# Patient Record
Sex: Female | Born: 1985 | Race: Black or African American | Hispanic: No | Marital: Married | Smoking: Never smoker
Health system: Southern US, Community
[De-identification: ages and names within clinical notes are randomized; demographics above are authoritative.]

## PROBLEM LIST (undated history)

## (undated) ENCOUNTER — Emergency Department (HOSPITAL_COMMUNITY): Admission: EM | Disposition: A | Discharge status: Other Acute Inpatient Hospital | Payer: Self-pay

## (undated) DIAGNOSIS — M545 Low back pain: Secondary | ICD-10-CM

## (undated) DIAGNOSIS — G35 Multiple sclerosis: Secondary | ICD-10-CM

## (undated) DIAGNOSIS — D649 Anemia, unspecified: Secondary | ICD-10-CM

## (undated) DIAGNOSIS — A599 Trichomoniasis, unspecified: Secondary | ICD-10-CM

## (undated) HISTORY — PX: OTHER SURGICAL HISTORY: SHX169

## (undated) HISTORY — DX: Low back pain: M54.5

## (undated) HISTORY — DX: Multiple sclerosis: G35

---

## 2010-11-15 ENCOUNTER — Emergency Department (HOSPITAL_COMMUNITY)
Admission: EM | Admit: 2010-11-15 | Discharge: 2010-11-15 | Payer: Self-pay | Source: Home / Self Care | Admitting: Emergency Medicine

## 2011-01-29 ENCOUNTER — Emergency Department (HOSPITAL_COMMUNITY)
Admission: EM | Admit: 2011-01-29 | Discharge: 2011-01-30 | Disposition: A | Discharge status: Home / Self Care | Payer: Self-pay | Attending: Emergency Medicine | Admitting: Emergency Medicine

## 2011-01-29 DIAGNOSIS — R072 Precordial pain: Secondary | ICD-10-CM | POA: Insufficient documentation

## 2011-01-29 DIAGNOSIS — M940 Chondrocostal junction syndrome [Tietze]: Secondary | ICD-10-CM | POA: Insufficient documentation

## 2011-01-29 DIAGNOSIS — M549 Dorsalgia, unspecified: Secondary | ICD-10-CM | POA: Insufficient documentation

## 2011-01-29 LAB — URINALYSIS, ROUTINE W REFLEX MICROSCOPIC
Ketones, ur: 15 mg/dL — AB
Protein, ur: NEGATIVE mg/dL
Urobilinogen, UA: 1 mg/dL (ref 0.0–1.0)

## 2011-01-29 LAB — URINE MICROSCOPIC-ADD ON

## 2011-01-29 LAB — POCT PREGNANCY, URINE: Preg Test, Ur: NEGATIVE

## 2012-01-16 ENCOUNTER — Encounter (HOSPITAL_COMMUNITY): Payer: Self-pay

## 2012-01-16 ENCOUNTER — Emergency Department (HOSPITAL_COMMUNITY)
Admission: EM | Admit: 2012-01-16 | Discharge: 2012-01-16 | Disposition: A | Discharge status: Home / Self Care | Payer: Self-pay | Attending: Emergency Medicine | Admitting: Emergency Medicine

## 2012-01-16 DIAGNOSIS — S0500XA Injury of conjunctiva and corneal abrasion without foreign body, unspecified eye, initial encounter: Secondary | ICD-10-CM | POA: Insufficient documentation

## 2012-01-16 DIAGNOSIS — X58XXXA Exposure to other specified factors, initial encounter: Secondary | ICD-10-CM | POA: Insufficient documentation

## 2012-01-16 MED ORDER — ERYTHROMYCIN 5 MG/GM OP OINT: TOPICAL_OINTMENT | OPHTHALMIC | Status: AC

## 2012-01-16 MED ORDER — FLUORESCEIN SODIUM 1 MG OP STRP
1.0000 | ORAL_STRIP | Freq: Once | OPHTHALMIC | Status: AC
  Administered 2012-01-16: 1 via OPHTHALMIC
  Filled 2012-01-16: qty 1

## 2012-01-16 MED ORDER — PROPARACAINE HCL 0.5 % OP SOLN
1.0000 [drp] | Freq: Once | OPHTHALMIC | Status: AC
  Administered 2012-01-16: 1 [drp] via OPHTHALMIC
  Filled 2012-01-16: qty 15

## 2012-01-16 NOTE — ED Notes (Signed)
Pt complains that her right eye is blurry and if she looks down it feels like a pin is stabbing her eye

## 2012-01-16 NOTE — Discharge Instructions (Signed)
This is likely a corneal abrasion. Please use the ointment as prescribed. Make a follow up with the ophthalmologist listed above if your condition worsens. Use the medication as prescribed for the next 10 days. Return to the ER if your condition worsens.  Corneal Abrasion The cornea is the clear covering at the front and center of the eye. When looking at the colored portion (iris) of the eye, you are looking through that person's cornea.  This very thin tissue is made up of many layers. The surface layer is a single layer of cells called the corneal epithelium. This is one of the most sensitive tissues in the body. If a scratch or injury causes the corneal epithelium to come off, it is called a corneal abrasion. If the injury extends to the tissues below the epithelium, the condition is called a corneal ulcer.  CAUSES   Scratches.   Trauma.   Foreign body in the eye.   Some people have recurrences of abrasions in the area of the original injury even after they heal. This is called recurrent erosion syndrome. Recurrent erosion syndromes generally improve and go away with time.  SYMPTOMS   Eye pain.   Difficulty or inability to keep the injured eye open.   The eye becomes very sensitive to light.   Recurrent erosions tend to happen suddenly, first thing in the morning - usually upon awakening and opening the eyes.  DIAGNOSIS  Your eye professional can diagnose a corneal abrasion during an eye exam. Dye is usually placed in the eye using a drop or a small paper strip moistened by the patient's tears. When the eye is examined with a special light, the abrasion shows up clearly because of the dye. TREATMENT   Small abrasions may be treated with antibiotic drops or ointment alone.   Usually a pressure patch is specially applied. Pressure patches prevent the eye from blinking, allowing the corneal epithelium to heal. Because blinking is less, a pressure patch also reduces the amount of pain  present in the eye during healing. Most corneal abrasions heal within 2-3 days with no effect on vision. WARNING: Do not drive or operate machinery while your eye is patched. Your ability to judge distances is impaired.   If abrasion becomes infected and spreads to the deeper tissues of the cornea, a corneal ulcer can result. This is serious because it can cause corneal scarring. Corneal scars interfere with light passing through the cornea, and cause a loss of vision in the involved eye.   If your caregiver has given you a follow-up appointment, it is very important to keep that appointment. Not keeping the appointment could result in a severe eye infection or permanent loss of vision. If there is any problem keeping the appointment, you must call back to this facility for assistance.  SEEK MEDICAL CARE IF:   You have pain, light sensitivity and a scratchy feeling in one eye (or both).   Your pressure patch keeps loosening up and you can blink your eye under the patch after treatment.   Any kind of discharge develops from the involved eye after treatment or if the lids stick together in the morning.   You have the same symptoms in the morning as you did with the original abrasion days, weeks or months after the abrasion healed.  MAKE SURE YOU:   Understand these instructions.   Will watch your condition.   Will get help right away if you are not doing well or  get worse.  Document Released: 10/04/2000 Document Revised: 09/26/2011 Document Reviewed: 05/12/2008 [facility] Patient Information 2012 [place], [place].

## 2012-01-16 NOTE — ED Provider Notes (Signed)
History     CSN: [id number]  Arrival date & time 01/16/12  2015   First MD Initiated Contact with Patient 01/16/12 2232      Chief Complaint  Patient presents with  . Eye Pain    (Consider location/radiation/quality/duration/timing/severity/associated sxs/prior treatment) Patient is a 26 y.o. female presenting with eye problem. The history is provided by the patient.  Eye Problem  This is a new problem. The current episode started 2 days ago. The problem occurs constantly. The problem has not changed since onset.There is pain in the right eye. There was no injury mechanism. The pain is moderate. There is no history of trauma to the eye. There is no known exposure to pink eye. She does not wear contacts. Associated symptoms include blurred vision and photophobia. Pertinent negatives include no decreased vision, no discharge, no double vision, no foreign body sensation, no eye redness, no nausea and no vomiting. She has tried nothing for the symptoms.   Pt presents with c/o blurry vision to the R medial eye, pain with looking down, and increased light sensitivity. No known FB or chemical exposure. She does not work with metal or wood.   Pt is a glasses wearer but does not wear contacts. Sees optometrist at [facility] for eye care. No hx injury to the eye.  History reviewed. No pertinent past medical history.  History reviewed. No pertinent past surgical history.  History reviewed. No pertinent family history.  History  Substance Use Topics  . Smoking status: Not on file  . Smokeless tobacco: Not on file  . Alcohol Use: No    OB History    Grav Para Term Preterm Abortions TAB SAB Ect Mult Living                  Review of Systems  Eyes: Positive for blurred vision and photophobia. Negative for double vision, discharge and redness.  Gastrointestinal: Negative for nausea and vomiting.  as per HPI  Allergies  Review of patient's allergies indicates no known allergies.  Home  Medications   Current Outpatient Rx  Name Route Sig Dispense Refill  . BC HEADACHE POWDER PO Oral Take 1 packet by mouth daily as needed. For pain.    . IBUPROFEN 200 MG PO TABS Oral Take 400 mg by mouth every 8 (eight) hours as needed. For pain.    Marland Kitchen PRENATAL MULTIVITAMIN CH Oral Take 1 tablet by mouth daily.      BP 121/58  Pulse 72  Temp(Src) 98 F (36.7 C) (Oral)  Resp 18  SpO2 99%  LMP 12/06/2011  Physical Exam  Nursing note and vitals reviewed. Constitutional: She appears well-developed and well-nourished. No distress.  HENT:  Head: Normocephalic and atraumatic.  Eyes: Conjunctivae and EOM are normal. Pupils are equal, round, and reactive to light.  Fundoscopic exam:      The right eye shows no AV nicking and no hemorrhage. The right eye shows red reflex.      The left eye shows no AV nicking and no hemorrhage. The left eye shows red reflex. Slit lamp exam:      The right eye shows fluorescein uptake. The right eye shows no corneal ulcer, no foreign body, no hyphema, no hypopyon and no anterior chamber bulge.         Conjunctival abrasion to medial sclera with inc fluorescein uptake, no FB 20/70 R, 20/40 L corrected  Neck: Normal range of motion.  Cardiovascular: Normal rate.   Pulmonary/Chest: Effort normal.  Neurological: She is alert.  Skin: Skin is warm and dry. She is not diaphoretic.  Psychiatric: She has a normal mood and affect.    ED Course  Procedures (including critical care time)  Labs Reviewed - No data to display No results found.   1. Conjunctival abrasion       MDM  Pt with conjunctival abrasion to medial sclera R. No evidence for corneal abrasion or other injury to eye. Will start on erythro ointment. Instructed to f/u with optho within 1-2 days for recheck. Return precautions discussed.        Grant Fontana, [place] 01/17/12 1434

## 2012-01-17 NOTE — ED Provider Notes (Signed)
Medical screening examination/treatment/procedure(s) were performed by non-physician practitioner and as supervising physician I was immediately available for consultation/collaboration.   Analisa Sledd, MD 01/17/12 2255 

## 2012-01-18 ENCOUNTER — Encounter (HOSPITAL_COMMUNITY): Payer: Self-pay | Admitting: Nurse Practitioner

## 2012-01-18 ENCOUNTER — Emergency Department (HOSPITAL_COMMUNITY)
Admission: EM | Admit: 2012-01-18 | Discharge: 2012-01-18 | Disposition: A | Discharge status: Home / Self Care | Payer: Self-pay | Attending: Emergency Medicine | Admitting: Emergency Medicine

## 2012-01-18 DIAGNOSIS — H538 Other visual disturbances: Secondary | ICD-10-CM | POA: Insufficient documentation

## 2012-01-18 MED ORDER — TETRACAINE HCL 0.5 % OP SOLN
1.0000 [drp] | Freq: Once | OPHTHALMIC | Status: DC
  Filled 2012-01-18: qty 2

## 2012-01-18 MED ORDER — TOBRAMYCIN 0.3 % OP SOLN
2.0000 [drp] | Freq: Once | OPHTHALMIC | Status: AC
  Administered 2012-01-18: 2 [drp] via OPHTHALMIC

## 2012-01-18 MED ORDER — TOBRAMYCIN 0.3 % OP SOLN: 2.0000 [drp] | Freq: Once | OPHTHALMIC | Status: DC

## 2012-01-18 MED ORDER — TOBRAMYCIN 0.3 % OP SOLN
2.0000 [drp] | Freq: Once | OPHTHALMIC | Status: DC
  Filled 2012-01-18: qty 5

## 2012-01-18 MED ORDER — TETRACAINE HCL 0.5 % OP SOLN
1.0000 [drp] | Freq: Once | OPHTHALMIC | Status: AC
  Administered 2012-01-18: 1 [drp] via OPHTHALMIC

## 2012-01-18 MED ORDER — FLUORESCEIN SODIUM 1 MG OP STRP
1.0000 | ORAL_STRIP | Freq: Once | OPHTHALMIC | Status: AC
  Administered 2012-01-18: 1 via OPHTHALMIC

## 2012-01-18 NOTE — ED Notes (Signed)
Pt was seen at [facility] on Thursday night and started on erythromycin for corneal abrasion. States she is having increased blurred vision and pressure in that eye even though she has been using the erythromycin, however she states she was not told how often or how long to apply the erythromycin so she has been using it 2x a day

## 2012-01-18 NOTE — ED Provider Notes (Signed)
History     CSN: [id number]  Arrival date & time 01/18/12  1240   First MD Initiated Contact with Patient 01/18/12 1303      Chief Complaint  Patient presents with  . Blurred Vision    (Consider location/radiation/quality/duration/timing/severity/associated sxs/prior treatment) HPI Comments: Patient presents with persistent blurry vision with occasional pain in right eye. She was seen two days ago and given tobrex ointment and diagnosed with a scleral abrasion. She states she was unable to schedule an appointment with ophthalmology. No drainage. No N/V. Pain is mild. She denies FB. She states she was using ointment, however only twice a day. She does not wear contact lenses.   Patient is a 26 y.o. female presenting with eye pain. The history is provided by the patient.  Eye Pain This is a recurrent problem. The current episode started in the past 7 days. The problem has been unchanged. Pertinent negatives include no fever, headaches, nausea, sore throat or vomiting. The symptoms are aggravated by nothing. She has tried nothing for the symptoms.    History reviewed. No pertinent past medical history.  History reviewed. No pertinent past surgical history.  History reviewed. No pertinent family history.  History  Substance Use Topics  . Smoking status: Never Smoker   . Smokeless tobacco: Not on file  . Alcohol Use: No    OB History    Grav Para Term Preterm Abortions TAB SAB Ect Mult Living                  Review of Systems  Constitutional: Negative for fever.  HENT: Negative for sore throat and rhinorrhea.   Eyes: Positive for pain and visual disturbance. Negative for photophobia, discharge and redness.  Gastrointestinal: Negative for nausea and vomiting.  Neurological: Negative for headaches.    Allergies  Review of patient's allergies indicates no known allergies.  Home Medications   Current Outpatient Rx  Name Route Sig Dispense Refill  . BC HEADACHE POWDER  PO Oral Take 1 packet by mouth daily as needed. For pain.    Marland Kitchen ERYTHROMYCIN 5 MG/GM OP OINT  Place a 1/2 inch ribbon of ointment into the lower eyelid. 1 g 0  . IBUPROFEN 200 MG PO TABS Oral Take 400 mg by mouth every 8 (eight) hours as needed. For pain.    Marland Kitchen PRENATAL MULTIVITAMIN CH Oral Take 1 tablet by mouth daily.      BP 126/77  Pulse 70  Temp 99.3 F (37.4 C)  Resp 20  Ht 5\' 10"  (1.778 m)  Wt 200 lb (90.719 kg)  BMI 28.70 kg/m2  SpO2 99%  LMP 12/06/2011  Physical Exam  Nursing note and vitals reviewed. Constitutional: She is oriented to person, place, and time. She appears well-developed and well-nourished.  HENT:  Head: Normocephalic and atraumatic.  Right Ear: External ear normal.  Left Ear: External ear normal.  Nose: Nose normal.  Mouth/Throat: Oropharynx is clear and moist.  Eyes: Conjunctivae and EOM are normal. Pupils are equal, round, and reactive to light. Right eye exhibits no chemosis, no discharge and no exudate. No foreign body present in the right eye. Left eye exhibits no chemosis, no discharge and no exudate. Right conjunctiva is not injected. Right conjunctiva has no hemorrhage. Left conjunctiva is not injected. Left conjunctiva has no hemorrhage. No scleral icterus. Right eye exhibits normal extraocular motion and no nystagmus. Left eye exhibits normal extraocular motion and no nystagmus.  Fundoscopic exam:      The right  eye shows no arteriolar narrowing, no AV nicking, no exudate, no hemorrhage and no papilledema. The right eye shows no venous pulsations. Slit lamp exam:      The right eye shows no corneal abrasion, no corneal ulcer, no foreign body, no hyphema, no fluorescein uptake and no anterior chamber bulge.  Neck: Normal range of motion. Neck supple.  Pulmonary/Chest: No respiratory distress.  Neurological: She is alert and oriented to person, place, and time.  Skin: Skin is warm and dry.  Psychiatric: She has a normal mood and affect.    ED  Course  Procedures (including critical care time)  Labs Reviewed - No data to display No results found.   1. Blurry vision     1:16 PM Patient seen and examined. Work-up initiated. Medications ordered.   Vital signs reviewed and are as follows: Filed Vitals:   01/18/12 1248  BP: 126/77  Pulse: 70  Temp: 99.3 F (37.4 C)  Resp: 20   Funduscopic exam performed. No abnormalities identified on non-dilated exam.   Two drops of tetracaine instilled into affected eye.   Fluorescein strip applied to affected eye. Slit lamp used to assess for corneal abrasion. No corneal abrasion identified. No foreign bodies noted. No visible hyphema.   Tonometry performed. Right eye pressure: 16,18  Patient tolerated procedure well without immediate complication.   Pt requested drops instead of ointment.   Patient counseled to use tobrex as follows: 1-2 drops in affected eye(s) every 4 hours while awake.Marland Kitchen  Pt counseled to see ophtho referral given prior, return with worsening.   MDM  Blurry vision -- not improving. Normal occular pressures in a young patient. No corneal abrasion identified. EOMs intact. No signs of iritis. Funduscopy normal on limited exam. No foreign bodies noted. No surrounding erythema, swelling, vision changes/loss suspicious for orbital or periorbital cellulitis. No symptoms of retinal detachment. No ophthalmologic emergency suspected. Outpatient referral already provided in case of no improvement.          Renne Crigler, [place] 01/18/12 1704

## 2012-01-18 NOTE — ED Notes (Signed)
Visual Acuity:  With Glasses- L-20/20 R-200/20 Both- 20/20  Without Glasses L-40/20 R-400/20 Both- 30/20

## 2012-01-18 NOTE — Discharge Instructions (Signed)
Please read and follow all provided instructions.  Your diagnoses today include:  1. Blurry vision    Tests performed today include:  Visual acuity testing to check your vision  Fluorescein dye examination to look for scratches on your eye  Eye pressure test (tonometry) that was normal  Vital signs. See below for your results today.   Medications prescribed:   Tobrex (tobramycin) - antibiotic eye drops  Use this medication as follows:   Use 1-2 drops in left eye every 4 hours while awake for 5 days.  Take any prescribed medications only as directed.  Home care instructions:  Follow any educational materials contained in this packet.  If you wear contact lenses, do not use them until your eye caregiver approves. See your caregiver or eye specialist as suggested for followup.   Follow-up instructions: Please follow-up with the opthalmologist listed in the next 2-3 days for further evaluation of your symptoms.  If you do not have a primary care doctor -- see below for referral information.   Return instructions:   Please return to the Emergency Department if you experience worsening symptoms.   Please return immediately if you develop severe pain, pus drainage, new change in vision, or fever.  Please return if you have any other emergent concerns.  Additional Information:  Your vital signs today were: BP 126/77  Pulse 70  Temp 99.3 F (37.4 C)  Resp 20  Ht 5\' 10"  (1.778 m)  Wt 200 lb (90.719 kg)  BMI 28.70 kg/m2  SpO2 99%  LMP 12/06/2011 If your blood pressure (BP) was elevated above 135/85 this visit, please have this repeated by your doctor within one month. -------------- No Primary Care Doctor Call [facility]  [phone] Other agencies that provide inexpensive medical care    [facility]  [phone]    [facility]  [phone]    [facility]  [phone]    [facility]  [phone]    [facility]  [phone]   [facility]  [phone] -------------- RESOURCE GUIDE:  Dental Problems  Patients with Medicaid: [facility] [phone] [address]                                            [phone] [address] Phone:  [phone]                                                   Phone:  [phone]  If unable to pay or uninsured, contact:  [facility] or [facility]. to become qualified for the adult dental clinic.  Chronic Pain Problems Contact [facility]  [phone] Patients need to be referred by their primary care doctor.  Insufficient Money for Medicine Contact [employer]:  call "211" or [facility] [phone].  Psychological Services [facility]  [phone] [facility]  [phone] [facility]   [phone] (emergency services [phone])  Substance Abuse Resources [facility]  [phone] [facility] [phone] [facility] [place] [phone] [facility] [phone] Residential &  Outpatient Substance Abuse Program  [phone]  Abuse/Neglect [facility] Child Abuse Hotline 620-607-8528 [facility] Child Abuse Hotline [phone] (After Hours)  Emergency Shelter [facility] [phone]  Maternity Homes [facility] at [facility] [place] [facility] [phone] [facility] [phone]  [facility]  [facility] [place]     [employer]                          [facility] Dept. [address] [place]                       [address]      [address]  Blondell Reveal Phone:  [phone]                                   Phone:  [phone]                 Phone:  [phone]  [facility] Phone:  [phone]  [facility] Child  Abuse Hotline 984-751-4734 [phone] (After Hours)

## 2012-01-19 NOTE — ED Provider Notes (Signed)
Medical screening examination/treatment/procedure(s) were performed by non-physician practitioner and as supervising physician I was immediately available for consultation/collaboration.   Forbes Cellar, MD 01/19/12 [phone]

## 2012-01-22 ENCOUNTER — Other Ambulatory Visit (HOSPITAL_COMMUNITY): Payer: Self-pay | Admitting: Ophthalmology

## 2012-01-22 DIAGNOSIS — H547 Unspecified visual loss: Secondary | ICD-10-CM

## 2012-01-23 ENCOUNTER — Ambulatory Visit (HOSPITAL_COMMUNITY): Admission: RE | Admit: 2012-01-23 | Payer: Self-pay | Source: Ambulatory Visit

## 2012-01-23 ENCOUNTER — Inpatient Hospital Stay (HOSPITAL_COMMUNITY)
Admission: RE | Admit: 2012-01-23 | Discharge: 2012-01-23 | Payer: Self-pay | Source: Ambulatory Visit | Attending: Ophthalmology | Admitting: Ophthalmology

## 2012-01-23 ENCOUNTER — Ambulatory Visit (HOSPITAL_COMMUNITY)
Admission: RE | Admit: 2012-01-23 | Discharge: 2012-01-23 | Disposition: A | Discharge status: Home / Self Care | Payer: Self-pay | Source: Ambulatory Visit | Attending: Ophthalmology | Admitting: Ophthalmology

## 2012-01-23 ENCOUNTER — Other Ambulatory Visit (HOSPITAL_COMMUNITY): Payer: Self-pay | Admitting: Ophthalmology

## 2012-01-23 DIAGNOSIS — H547 Unspecified visual loss: Secondary | ICD-10-CM

## 2012-01-23 DIAGNOSIS — G9389 Other specified disorders of brain: Secondary | ICD-10-CM | POA: Insufficient documentation

## 2012-01-23 DIAGNOSIS — H538 Other visual disturbances: Secondary | ICD-10-CM | POA: Insufficient documentation

## 2012-01-23 MED ORDER — GADOBENATE DIMEGLUMINE 529 MG/ML IV SOLN
19.0000 mL | Freq: Once | INTRAVENOUS | Status: AC | PRN
  Administered 2012-01-23: 19 mL via INTRAVENOUS

## 2012-09-30 ENCOUNTER — Emergency Department (HOSPITAL_COMMUNITY)
Admission: EM | Admit: 2012-09-30 | Discharge: 2012-09-30 | Disposition: A | Discharge status: Home / Self Care | Payer: Self-pay | Attending: Emergency Medicine | Admitting: Emergency Medicine

## 2012-09-30 ENCOUNTER — Encounter (HOSPITAL_COMMUNITY): Payer: Self-pay | Admitting: Emergency Medicine

## 2012-09-30 DIAGNOSIS — M79601 Pain in right arm: Secondary | ICD-10-CM

## 2012-09-30 DIAGNOSIS — M79602 Pain in left arm: Secondary | ICD-10-CM

## 2012-09-30 DIAGNOSIS — M79609 Pain in unspecified limb: Secondary | ICD-10-CM | POA: Insufficient documentation

## 2012-09-30 DIAGNOSIS — R209 Unspecified disturbances of skin sensation: Secondary | ICD-10-CM | POA: Insufficient documentation

## 2012-09-30 NOTE — ED Provider Notes (Signed)
History     CSN: [id number]  Arrival date & time 09/30/12  1903   First MD Initiated Contact with Patient 09/30/12 2018      Chief Complaint  Patient presents with  . Numbness    (Consider location/radiation/quality/duration/timing/severity/associated sxs/prior treatment) HPI  26 year old female presents complaining of numbness. Patient reports for the past 2-3 weeks she developed a gradual onset of tingling sensation to the tips of her fingers. State pain sensation is affected the second third and fourth fingers on both hands and has been persistent. She also endorsed achy pain for the past few days to the affected fingers. Similar tingling sensation to the skin surface of the abdomen for one week. And tingling sensation to the second and third toes on both feet for the past 3 days.  Symptom has been persistent, nothing makes it better or worse, she feels as if her leg is going to give out for the past few days, but denies falling. She never had similar symptoms as before. No treatment tried. She denies fever, chills, headache, vision changes, chest pain, shortness of breath, abdominal pain, nausea, vomiting, diarrhea, rash. She denies dropping objects due to numbness. Denies any medication changes or rec drug use.   In April of this month she developed a transient loss of vision fall about a month. She was evaluated by an eye specialist who ordered a brain MRI that shows some findings suggestive of multiple sclerosis. Patient states her vision regain without treatment and she has not had any further followup. She denies history of MS. Otherwise patient denies any other significant past medical history. Patient has been under a lot of stress due to the loss of her father Oct 31st due to suicide, and unsure if this symptom is related to it.  History reviewed. No pertinent past medical history.  History reviewed. No pertinent past surgical history.  History reviewed. No pertinent family  history.  History  Substance Use Topics  . Smoking status: Never Smoker   . Smokeless tobacco: Not on file  . Alcohol Use: No    OB History    Grav Para Term Preterm Abortions TAB SAB Ect Mult Living                  Review of Systems  All other systems reviewed and are negative.    Allergies  Review of patient's allergies indicates no known allergies.  Home Medications  No current outpatient prescriptions on file.  BP 118/81  Pulse 71  Temp 98.5 F (36.9 C) (Oral)  Resp 16  SpO2 100%  LMP 09/05/2012  Physical Exam  Nursing note and vitals reviewed. Constitutional: She is oriented to person, place, and time. She appears well-developed and well-nourished. No distress.       Awake, alert, nontoxic appearance  HENT:  Head: Atraumatic.  Eyes: Conjunctivae normal are normal. Right eye exhibits no discharge. Left eye exhibits no discharge.  Neck: Neck supple.  Cardiovascular: Normal rate and regular rhythm.   Pulmonary/Chest: Effort normal. No respiratory distress. She exhibits no tenderness.  Abdominal: Soft. There is no tenderness. There is no rebound.  Musculoskeletal: She exhibits no tenderness.       ROM appears intact, no obvious focal weakness  Neurological: She is alert and oriented to person, place, and time. She has normal strength and normal reflexes. She displays no atrophy. No cranial nerve deficit or sensory deficit. She exhibits normal muscle tone. She displays a negative Romberg sign. Coordination and gait normal.  GCS eye subscore is 4. GCS verbal subscore is 5. GCS motor subscore is 6.       Mental status and motor strength appears intact.  Subjective paresthesia to tip of 2nd/3rd/4th fingers on both hand, throughout surface of abdomen, and also to tip of 2nd and 3rd toes both feet without overlying skin changes.   Able to appreciate 2 point discrimination, light touch, and also normal proprioception.     Patella DTR and brachiocephalic DTR 2+  bilaterally  Skin: No rash noted.  Psychiatric: She has a normal mood and affect.    ED Course  Procedures (including critical care time)  Labs Reviewed - No data to display No results found.   No diagnosis found.   *RADIOLOGY REPORT*  Clinical Data: Blurred vision right eye for 1 week.  MRI HEAD AND ORBITS WITHOUT AND WITH CONTRAST  Technique: Multiplanar, multiecho pulse sequences of the brain and  surrounding structures were obtained without and with intravenous  contrast. Multiplanar, multiecho pulse sequences of the orbits and  surrounding structures were obtained including fat saturation  techniques, before and after intravenous contrast administration.  Contrast: 19 ml Multihance IV  Comparison: None.  MRI HEAD  Findings: Scattered white matter lesions are seen in the brain.  Small lesions are present in the deep frontal white matter  bilaterally. Poorly defined hyperintensity in the left frontal  cortex and white matter over the convexity. Ill-defined  hyperintensity in the left anterior cerebellum. Small lesion in  the left lateral mid brain. These lesions do not enhance and do  not show restricted diffusion. These are suspicious for  demyelinating disease.  Ventricle size is normal. No acute infarct. Negative for  hemorrhage or fluid collection. Postcontrast imaging of the brain  reveals normal enhancement.  IMPRESSION:  There are approximately six lesions in the brain which are  consistent with multiple sclerosis. In addition, there is  abnormality in the right optic nerve consistent with multiple  sclerosis, see below report. No enhancing lesions are identified.  MRI ORBITS  Findings: Mild swelling of hyperintensity in the right optic nerve  is present. This shows abnormal enhancement with slight  stranding in the surrounding orbital fat suggesting acute  inflammation. Enhancement is most prominent within the optic canal  on the right. Optic chiasm is normal.   The left optic nerve is normal. The remainder the orbit is normal  bilaterally. The globe is normal and the extraocular muscles are  normal.  IMPRESSION:  Diffuse abnormality of the right optic nerve which shows increased  signal on T2 and abnormal enhancement. 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 Findings  are compatible with multiple sclerosis given the findings within  the brain described above.  Original Report Authenticated By: Camelia Phenes, M.D.            External Result Report     External Result Report            Imaging     Imaging Information            Signed by       Signed  Date/Time    Phone  Pager    Arlan Organ, DAVID C  01/24/2012  7:50 AM EST  [phone]  [phone]          Exam Information       Status  Exam Begun    Exam Ended       Final [99]  01/23/2012  5:09 PM EST  01/23/2012  6:35 PM EST      1. Tingling of fingers and toes  MDM  Pt presents with tingling sensation to tips of fingers/toes/and skin surface of abdomen.  Normal neuro examination with subjected paresthesia to affected area.  She did have a brain MRI performed in April which shows demylination concerning for MS.  Unsure if this is MS related, however without obvious focal neuro deficits here, i believe pt warranted further outpatient evaluation by neuro.  Pt currently having sensory loss only, no motor loss.  Care discussed with my attending.    9:22 PM I have consulted neurologist, Dr. Amada Jupiter, who recommend pt to f/u outpatient with [facility] [phone] for further management.     BP 118/81  Pulse 71  Temp 98.5 F (36.9 C) (Oral)  Resp 16  SpO2 100%  LMP 09/05/2012  I have reviewed nursing notes and vital signs. I personally reviewed the imaging tests through PACS system  I reviewed available ER/hospitalization records thought the EMR    Fayrene Helper, [place] 09/30/12 2140

## 2012-09-30 NOTE — ED Provider Notes (Signed)
Medical screening examination/treatment/procedure(s) were performed by non-physician practitioner and as supervising physician I was immediately available for consultation/collaboration. Devoria Albe, MD, Armando Gang   Ward Givens, MD 09/30/12 [phone]

## 2012-09-30 NOTE — ED Notes (Signed)
Pt states her fingers have been tingling for a few weeks. Pt states now her toes are tingling. Pt also states her legs feel like they are going to give out over the last few days. Pt ambulatory with steady gait to triage room. Pt reports the skin over her stomach feels numb also. Pt denies pain.

## 2012-10-04 ENCOUNTER — Encounter (HOSPITAL_COMMUNITY): Payer: Self-pay | Admitting: Emergency Medicine

## 2012-10-04 ENCOUNTER — Emergency Department (HOSPITAL_COMMUNITY)
Admission: EM | Admit: 2012-10-04 | Discharge: 2012-10-05 | Disposition: A | Discharge status: Home / Self Care | Payer: Self-pay | Attending: Emergency Medicine | Admitting: Emergency Medicine

## 2012-10-04 DIAGNOSIS — R209 Unspecified disturbances of skin sensation: Secondary | ICD-10-CM | POA: Insufficient documentation

## 2012-10-04 DIAGNOSIS — G35 Multiple sclerosis: Secondary | ICD-10-CM | POA: Insufficient documentation

## 2012-10-04 HISTORY — DX: Multiple sclerosis: G35

## 2012-10-04 NOTE — ED Notes (Signed)
Patient with right arm and leg weakness.  Patient has been diagnosed with MS and has not followed up yet with neurologist until next month.  Patient states that she has been getting worse.

## 2012-10-05 MED ORDER — DEXAMETHASONE 4 MG PO TABS
4.0000 mg | ORAL_TABLET | Freq: Once | ORAL | Status: AC
  Administered 2012-10-05: 4 mg via ORAL
  Filled 2012-10-05: qty 1

## 2012-10-05 MED ORDER — DEXAMETHASONE 4 MG PO TABS: 4.0000 mg | ORAL_TABLET | Freq: Four times a day (QID) | ORAL | Status: DC

## 2012-10-05 NOTE — ED Provider Notes (Signed)
History     CSN: [id number]  Arrival date & time 10/04/12  2124   First MD Initiated Contact with Patient 10/04/12 2345      Chief Complaint  Patient presents with  . Weakness    (Consider location/radiation/quality/duration/timing/severity/associated sxs/prior treatment) HPI Comments: 61 her old female with a history of multiple sclerosis that was diagnosed in April of this year who has currently gone untreated since diagnosis. Initially her symptoms were consistent with optic neuritis, and MRI of the orbits and the brain at that time showed several lesions consistent with MS. Her symptoms spontaneously resolved and she did very well for several months. Recently over the last several days she has had right upper extremity tingling to her third fourth and fifth digits with extension to the wrist on the ulnar surface of the hand, similar pattern on the left upper extremity. She has also noticed mild weakness to the right arm and the right leg. She denies any difficulty with swallowing, speaking, breathing and has no trouble with her vision, bowel movements or bladder control. She does note a mild numbness across her abdomen. Nothing seems to make this better or worse, is persistent and it is not associated with fevers, coughing, diarrhea, dysuria, swelling. She has a followup with a neurologist in one month.  Patient is a 26 y.o. female presenting with weakness. The history is provided by the patient, a relative and medical records.  Weakness  Additional symptoms include weakness.    Past Medical History  Diagnosis Date  . MS (multiple sclerosis)     History reviewed. No pertinent past surgical history.  No family history on file.  History  Substance Use Topics  . Smoking status: Never Smoker   . Smokeless tobacco: Not on file  . Alcohol Use: No    OB History    Grav Para Term Preterm Abortions TAB SAB Ect Mult Living                  Review of Systems  Neurological:  Positive for weakness.  All other systems reviewed and are negative.    Allergies  Review of patient's allergies indicates no known allergies.  Home Medications   Current Outpatient Rx  Name  Route  Sig  Dispense  Refill  . DEXAMETHASONE 4 MG PO TABS   Oral   Take 1 tablet (4 mg total) by mouth 4 (four) times daily.   20 tablet   0     BP 105/69  Pulse 68  Temp 98.5 F (36.9 C) (Oral)  Resp 16  SpO2 100%  LMP 09/05/2012  Physical Exam  Nursing note and vitals reviewed. Constitutional: She appears well-developed and well-nourished. No distress.  HENT:  Head: Normocephalic and atraumatic.  Mouth/Throat: Oropharynx is clear and moist. No oropharyngeal exudate.  Eyes: Conjunctivae normal and EOM are normal. Pupils are equal, round, and reactive to light. Right eye exhibits no discharge. Left eye exhibits no discharge. No scleral icterus.  Neck: Normal range of motion. Neck supple. No JVD present. No thyromegaly present.  Cardiovascular: Normal rate, regular rhythm, normal heart sounds and intact distal pulses.  Exam reveals no gallop and no friction rub.   No murmur heard. Pulmonary/Chest: Effort normal and breath sounds normal. No respiratory distress. She has no wheezes. She has no rales.  Abdominal: Soft. Bowel sounds are normal. She exhibits no distension and no mass. There is no tenderness.  Musculoskeletal: Normal range of motion. She exhibits no edema and no tenderness.  Lymphadenopathy:    She has no cervical adenopathy.  Neurological: She is alert. Coordination normal.       Neurologic exam:  Speech clear, pupils equal round reactive to light, extraocular movements intact  Normal peripheral visual fields Cranial nerves III through XII normal including no facial droop Follows commands, moves all extremities x4, normal strength to bilateral upper and lower extremities at all major muscle groups including grip - except for the right lower extremity at the hip which  has mild 4+ out of 5 strength Decreased sensation to the ulnar surface of both hands over the third fourth and fifth digits Coordination intact, no limb ataxia, finger-nose-finger normal Rapid alternating movements normal No pronator drift Gait normal   Skin: Skin is warm and dry. No rash noted. No erythema.  Psychiatric: She has a normal mood and affect. Her behavior is normal.    ED Course  Procedures (including critical care time)  Labs Reviewed - No data to display No results found.   1. Multiple sclerosis exacerbation       MDM  The patient is very well-appearing, she has normal vital signs, she does have mild symptoms of MS, I have discussed her care with the neurologist Dr. Petra Kuba who has recommended inpatient admission versus oral dexamethasone as an outpatient for 5 days. I have discussed this with the patient who has declined admission and states she would rather try outpatient management. She will be given Decadron prior to leaving and 5 days of 4 mg 4 times a day for 5 days. She appears stable for discharge and has understood her indications for return.        Vida Roller, MD 10/05/12 [phone]

## 2012-10-05 NOTE — ED Notes (Signed)
Pt/ was seen at [facility] for same complaint. Wants something to help because she says it hurts.

## 2013-01-11 ENCOUNTER — Telehealth: Payer: Self-pay | Admitting: Neurology

## 2013-01-11 NOTE — Telephone Encounter (Signed)
I have talked with Denton Lank, she complains of right hip leg low back tightness and gait difficulty xone week, right hand numbness, tingling, getting worsen, Talbert Forest please give her an appointment tomorrow before proceed with treatment, likely flare ups

## 2013-01-11 NOTE — Telephone Encounter (Deleted)
Patient called stating that she thinks that she is having a relapse. She complains of right leg and hip, numbness and tingling in her hands.

## 2013-01-12 ENCOUNTER — Ambulatory Visit (INDEPENDENT_AMBULATORY_CARE_PROVIDER_SITE_OTHER): Payer: Self-pay | Admitting: Neurology

## 2013-01-12 ENCOUNTER — Encounter: Payer: Self-pay | Admitting: Neurology

## 2013-01-12 VITALS — BP 124/89 | HR 73 | Temp 98.3°F | Resp 18 | Wt 210.0 lb

## 2013-01-12 DIAGNOSIS — G35 Multiple sclerosis: Secondary | ICD-10-CM

## 2013-01-12 HISTORY — DX: Multiple sclerosis: G35

## 2013-01-12 MED ORDER — GABAPENTIN 300 MG PO CAPS: 100.0000 mg | ORAL_CAPSULE | Freq: Three times a day (TID) | ORAL | Status: DC

## 2013-01-12 MED ORDER — METHYLPREDNISOLONE (PAK) 4 MG PO TABS: ORAL_TABLET | ORAL | Status: DC

## 2013-01-12 NOTE — Progress Notes (Signed)
Clinical history: Carol [name] is a 27 year old right-handed female is enrolled in [place], randomized to Gilenya, in March 5th 2014.   In March 2013, she developed new-onset right eye pain and blurred vision. She was initially diagnosed with corneal abrasion. She went to the emergency room twice for this. She then followed up with ophthalmology, who ordered MRI of the brain and orbits. This showed acute right optic neuritis and 7-8 chronic to moderate plaques. She was diagnosed with probable multiple sclerosis   Around beginning of December 2013, patient developed new symptoms of numbness in her bilateral fingertips and bilateral toes, lasted for 2 weeks.  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 Patient also has intermittent muscle spasms and balance difficulty. No family history of MS.  MRI brain showed approximately six lesions in the brain which are consistent with multiple sclerosis. there is abnormality in the right optic nerve consistent with  multiple sclerosis, see below report.  No enhancing lesions are identified.   MRI ORBITS: Mild swelling of hyperintensity in the right optic nerve is present.  This shows  abnormal  enhancement with slight stranding in the surrounding orbital fat suggesting acute inflammation.  Enhancement is most prominent within the optic canal on the right.  Optic chiasm is normal. The left optic nerve is normal.  She now complains of right low back pain, radiating pain to right leg,  bilateral hands, and feet paresthesia,  UPDATE March 25th 2014: This is an early appointment, she woke up 10 days ago noticed right leg stiff, painful, buring sensiation, getting worseing, difficulty walkin, dragging right leg, right wrist feels tighten, heaviness, she denies visoin complains, she denies bowel and bladder incontinence, she denies fever, or other signs of infection such as  UTI upper respiratory infection She is taking Gilenya, compliance.     Physical Exam  General: normal Head: normal Ears, Nose and Throat: normal Neck: supple no carotid bruits Respiratory: clear to auscultation bilaterally Cardiovascular: regular rate rhythm Musculoskeletal: normal Skin: normal Trunk: normal  Neurologic Exam  Mental Status: pleasant, awake, alert, cooperative to history, talking, and casual conversation. Cranial Nerves: CN II-XII pupils were equal round reactive to light.  Fundi were sharp bilaterally.  Extraocular movements were full.  Visual fields were full on confrontational test.  Facial sensation and strength were normal.  Hearing was intact to finger rubbing bilaterally.  Uvula tongue were midline.  Head turning and shoulder shrugging were normal and symmetric.  Tongue protrusion into the cheeks strength were normal.  Motor: She has mild right hip flexion, slight right ankle dorsiflexion weakness, Sensory: Normal to light touch, pinprick, proprioception, and vibratory sensation. Coordination: Normal finger-to-nose, heel-to-shin.  There was no dysmetria noticed. Gait and Station: She has mild stiffness of her right leg, dragging her right leg, Romberg sign: Negative Reflexes: Deep tendon reflexes: Biceps: 2/2, Brachioradialis: 2/2, Triceps: 2/2, Pateller: 3/3, Achilles: 2/2.  Plantar responses are extensor bilaterally.  Assessment and plan: 27 years old with relapsing remitting multiple sclerosis. she has a flareup of right leg stiffness, gait difficulty 1. medro pack 2. Gilenya

## 2013-01-20 ENCOUNTER — Ambulatory Visit (INDEPENDENT_AMBULATORY_CARE_PROVIDER_SITE_OTHER): Payer: Self-pay | Admitting: Neurology

## 2013-01-20 ENCOUNTER — Encounter: Payer: Self-pay | Admitting: Neurology

## 2013-01-20 VITALS — BP 115/82 | HR 75 | Temp 97.9°F | Resp 18 | Wt 217.0 lb

## 2013-01-20 DIAGNOSIS — M545 Low back pain, unspecified: Secondary | ICD-10-CM

## 2013-01-20 DIAGNOSIS — G35 Multiple sclerosis: Secondary | ICD-10-CM

## 2013-01-20 HISTORY — DX: Low back pain, unspecified: M54.50

## 2013-01-20 NOTE — Progress Notes (Signed)
Pt is seen today for V3 mo. 1 PreferMS visit. Pt continues to meet inc./ exc. criteria. Labs, and PRO completed as per protocol. Dr. Terrace Arabia in to complete bradycardia, ambulation assessment, neuro and physical exam. There is no Con Med changes, no AE's or SAE's to report or new Ms relapse, but worsening MS relapse.  Pt. is randomized to Gilenya and will continue today as he is tolerating the randomized medication well.  Pt returned 6 pills of the study drug. Pt. was given Gilenya medication guide to take home. Pt.'s questions were answered and  appointmenets made for eye exam and V4 mo.3 given to pt.   Clinical history: Carol [name] is a 27 year old right-handed female is enrolled in [place], randomized to Gilenya, in March 5th 2014.   In March 2013, she developed new-onset right eye pain and blurred vision. She was initially diagnosed with corneal abrasion. She went to the emergency room twice for this. She then followed up with ophthalmology, who ordered MRI of the brain and orbits. This showed acute right optic neuritis and 7-8 chronic to moderate plaques. She was diagnosed with probable multiple sclerosis   Around beginning of December 2013, patient developed new symptoms of numbness in her bilateral fingertips and bilateral toes, lasted for 2 weeks.  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 Patient also has intermittent muscle spasms and balance difficulty. No family history of MS.  MRI brain showed approximately six lesions in the brain which are consistent with multiple sclerosis. there is abnormality in the right optic nerve consistent with  multiple sclerosis, see below report.  No enhancing lesions are identified.   MRI ORBITS: Mild swelling of hyperintensity in the right optic nerve is present.  This shows  abnormal  enhancement with slight stranding in the surrounding orbital fat suggesting acute  inflammation.  Enhancement is most prominent within the optic canal on the right.  Optic chiasm is normal. The left optic nerve is normal.  She now complains of right low back pain, radiating pain to right leg,  bilateral hands, and feet paresthesia,  I saw her in March 25th 2014, she woke up 10 days ago noticed right leg stiff, painful, buring sensiation, getting worseing, difficulty walking, dragging right leg, right wrist feels tighten, heaviness, she denies visoin complains, she denies bowel and bladder incontinence, she denies fever, or other signs of infection such as UTI upper respiratory infection, I was considering possibility of MS flareup, give her a course of Medrol Pak, which did not improve her symptoms at all.  Today she complains of worsening low back pain, radiating pain to the right groin, right anterior thigh,  continued gait difficulty because of right back pain, and discomfort,   Physical Exam  General: normal Head: normal Ears, Nose and Throat: normal Neck: supple no carotid bruits Respiratory: clear to auscultation bilaterally Cardiovascular: regular rate rhythm Musculoskeletal: Right paraspinal muscle tenderness upon palpation Skin: normal Trunk: normal  Neurologic Exam  Mental Status: pleasant, awake, alert, cooperative to history, talking, and casual conversation. Cranial Nerves: CN II-XII pupils were equal round reactive to light.  Fundi were sharp bilaterally.  Extraocular movements were full.  Visual fields were full on confrontational test.  Facial sensation and strength were normal.  Hearing was intact to finger rubbing bilaterally.  Uvula tongue were midline.  Head turning and shoulder shrugging were normal and symmetric.  Tongue protrusion into the cheeks strength were normal.  Motor: She has mild right hip flexion due to limitation from pain Sensory: Normal to light touch, pinprick, proprioception, and vibratory sensation. Coordination: Normal  finger-to-nose, heel-to-shin.  There was no dysmetria noticed. Gait and Station: She has mild stiffness of her right leg, dragging her right leg, Romberg sign: Negative Reflexes: Deep tendon reflexes: Biceps: 2/2, Brachioradialis: 2/2, Triceps: 2/2, Pateller: 3/3, Achilles: 2/2.  Plantar responses are extensor bilaterally.  Assessment and plan: 27 years old with relapsing remitting multiple sclerosis.   Continue Gilenya. Right low back pain, radiating pain to her right lower extremity, right lumbar paraspinal muscle tenderness upon palpation, suggestive of right lumbar radiculopathy, she failed to improve with steroid,  Hot compression, back stretching exercise, continue followup , I also wrote her a week off from her job .

## 2013-01-24 ENCOUNTER — Emergency Department (HOSPITAL_COMMUNITY)
Admission: EM | Admit: 2013-01-24 | Discharge: 2013-01-24 | Disposition: A | Discharge status: Home / Self Care | Payer: Self-pay | Attending: Emergency Medicine | Admitting: Emergency Medicine

## 2013-01-24 ENCOUNTER — Encounter (HOSPITAL_COMMUNITY): Payer: Self-pay | Admitting: Physical Medicine and Rehabilitation

## 2013-01-24 DIAGNOSIS — M549 Dorsalgia, unspecified: Secondary | ICD-10-CM | POA: Insufficient documentation

## 2013-01-24 DIAGNOSIS — M25559 Pain in unspecified hip: Secondary | ICD-10-CM | POA: Insufficient documentation

## 2013-01-24 DIAGNOSIS — G35 Multiple sclerosis: Secondary | ICD-10-CM | POA: Insufficient documentation

## 2013-01-24 MED ORDER — OXYCODONE-ACETAMINOPHEN 5-325 MG PO TABS: 1.0000 | ORAL_TABLET | Freq: Four times a day (QID) | ORAL | Status: DC | PRN

## 2013-01-24 MED ORDER — OXYCODONE-ACETAMINOPHEN 5-325 MG PO TABS
2.0000 | ORAL_TABLET | Freq: Once | ORAL | Status: AC
Start: 2013-01-24 — End: 2013-01-24
  Administered 2013-01-24: 2 via ORAL
  Filled 2013-01-24: qty 2

## 2013-01-24 MED ORDER — NAPROXEN 500 MG PO TBEC: 500.0000 mg | DELAYED_RELEASE_TABLET | Freq: Two times a day (BID) | ORAL | Status: DC

## 2013-01-24 NOTE — ED Provider Notes (Signed)
History     CSN: [id number]  Arrival date & time 01/24/13  1444   First MD Initiated Contact with Patient 01/24/13 1538      Chief Complaint  Patient presents with  . Back Pain    (Consider location/radiation/quality/duration/timing/severity/associated sxs/prior treatment) The history is provided by the patient.   the patient reports 2 months of ongoing mid to low back pain.  She has a history of multiple sclerosis.  She spoken with her neurologist about this he states this is pain related to her MS.  She's been given naproxen without improvement in her symptoms.  She also reports over the last 4-6 weeks a feeling of tightness in her right proximal anterior thigh and right proximal lateral thigh.  No rash erythema.  No fevers or chills.  No weakness of her lower extremities.  She's been ambulatory in the emergency department.  No abdominal pain nausea vomiting or diarrhea.  No urinary symptoms.   Past Medical History  Diagnosis Date  . MS (multiple sclerosis)   . Multiple sclerosis 01/12/2013  . Low back pain 01/20/2013    No past surgical history on file.  No family history on file.  History  Substance Use Topics  . Smoking status: Never Smoker   . Smokeless tobacco: Not on file  . Alcohol Use: No    OB History   Grav Para Term Preterm Abortions TAB SAB Ect Mult Living                  Review of Systems  Musculoskeletal: Positive for back pain.  All other systems reviewed and are negative.    Allergies  Review of patient's allergies indicates no known allergies.  Home Medications   Current Outpatient Rx  Name  Route  Sig  Dispense  Refill  . gabapentin (NEURONTIN) 300 MG capsule   Oral   Take 1 capsule (300 mg total) by mouth 3 (three) times daily.   90 capsule   12   . naproxen (EC NAPROSYN) 500 MG EC tablet   Oral   Take 1 tablet (500 mg total) by mouth 2 (two) times daily with a meal.   15 tablet   0   . oxyCODONE-acetaminophen (PERCOCET/ROXICET)  5-325 MG per tablet   Oral   Take 1 tablet by mouth every 6 (six) hours as needed for pain.   20 tablet   0     BP 116/88  Pulse 79  Temp(Src) 97.9 F (36.6 C) (Oral)  Resp 14  SpO2 99%  Physical Exam  Nursing note and vitals reviewed. Constitutional: She is oriented to person, place, and time. She appears well-developed and well-nourished. No distress.  HENT:  Head: Normocephalic and atraumatic.  Eyes: EOM are normal.  Neck: Normal range of motion.  Cardiovascular: Normal rate, regular rhythm and normal heart sounds.   Pulmonary/Chest: Effort normal and breath sounds normal.  Abdominal: Soft. She exhibits no distension. There is no tenderness.  Musculoskeletal: Normal range of motion.  Mild thoracic and parathoracic tenderness.  No rash or redness of her back.  Normal range of motion of right hip.  No erythema warmth or rash.  No significant tenderness in her right groin.  Neurological: She is alert and oriented to person, place, and time.  Skin: Skin is warm and dry.  Psychiatric: She has a normal mood and affect. Judgment normal.    ED Course  Procedures (including critical care time)  Labs Reviewed - No data to display No  results found.   1. Back pain       MDM  She's been more musculoskeletal back pain and hip pain.  This is likely pain related to her MS.  Neurology followup.  She will also need a primary care physician/neuro practitioner.  Pain medicine.  No indication for labs or imaging.  Ambulatory.  Normal neurologic exam.  No signs of infection.         Lyanne Co, MD 01/24/13 [phone]

## 2013-01-24 NOTE — ED Notes (Signed)
Pt presents to department for evaluation of back pain. Ongoing x2 months. 8/10 pain at the time. History of MS. Ambulatory to triage. Pt is alert and oriented x4.

## 2013-02-10 ENCOUNTER — Telehealth: Payer: Self-pay | Admitting: Neurology

## 2013-02-10 NOTE — Telephone Encounter (Signed)
Patient called and stated that wants to go back to her regular work hours and her job needs your consent that she able to do that.

## 2013-02-10 NOTE — Telephone Encounter (Signed)
I have talked with patient, she wants to go back to work for longer hours. We will review previous paper work.

## 2013-02-11 ENCOUNTER — Telehealth: Payer: Self-pay | Admitting: Neurology

## 2013-02-11 NOTE — Telephone Encounter (Signed)
I have called her, instruct her to come in with form to fill, she wants liberal of her working hours, feeling better.

## 2013-02-19 ENCOUNTER — Emergency Department (HOSPITAL_COMMUNITY): Payer: Self-pay

## 2013-02-19 ENCOUNTER — Emergency Department (HOSPITAL_COMMUNITY)
Admission: EM | Admit: 2013-02-19 | Discharge: 2013-02-19 | Disposition: A | Discharge status: Home / Self Care | Payer: No Typology Code available for payment source | Attending: Emergency Medicine | Admitting: Emergency Medicine

## 2013-02-19 ENCOUNTER — Ambulatory Visit (INDEPENDENT_AMBULATORY_CARE_PROVIDER_SITE_OTHER): Payer: Self-pay | Admitting: Neurology

## 2013-02-19 ENCOUNTER — Encounter (HOSPITAL_COMMUNITY): Payer: Self-pay | Admitting: *Deleted

## 2013-02-19 VITALS — BP 117/83 | HR 78 | Temp 98.3°F | Resp 18 | Wt 211.2 lb

## 2013-02-19 DIAGNOSIS — W010XXA Fall on same level from slipping, tripping and stumbling without subsequent striking against object, initial encounter: Secondary | ICD-10-CM | POA: Insufficient documentation

## 2013-02-19 DIAGNOSIS — G35 Multiple sclerosis: Secondary | ICD-10-CM

## 2013-02-19 DIAGNOSIS — S93401A Sprain of unspecified ligament of right ankle, initial encounter: Secondary | ICD-10-CM

## 2013-02-19 DIAGNOSIS — Y9289 Other specified places as the place of occurrence of the external cause: Secondary | ICD-10-CM | POA: Insufficient documentation

## 2013-02-19 DIAGNOSIS — Z8669 Personal history of other diseases of the nervous system and sense organs: Secondary | ICD-10-CM | POA: Insufficient documentation

## 2013-02-19 DIAGNOSIS — Z8739 Personal history of other diseases of the musculoskeletal system and connective tissue: Secondary | ICD-10-CM | POA: Insufficient documentation

## 2013-02-19 DIAGNOSIS — Y9389 Activity, other specified: Secondary | ICD-10-CM | POA: Insufficient documentation

## 2013-02-19 DIAGNOSIS — S93409A Sprain of unspecified ligament of unspecified ankle, initial encounter: Secondary | ICD-10-CM | POA: Insufficient documentation

## 2013-02-19 MED ORDER — IBUPROFEN 400 MG PO TABS
400.0000 mg | ORAL_TABLET | Freq: Once | ORAL | Status: AC
  Administered 2013-02-19: 400 mg via ORAL
  Filled 2013-02-19: qty 1

## 2013-02-19 MED ORDER — HYDROCODONE-ACETAMINOPHEN 5-325 MG PO TABS: ORAL_TABLET | ORAL | Status: DC

## 2013-02-19 MED ORDER — HYDROCODONE-ACETAMINOPHEN 5-325 MG PO TABS
1.0000 | ORAL_TABLET | Freq: Once | ORAL | Status: AC
  Administered 2013-02-19: 1 via ORAL
  Filled 2013-02-19: qty 1

## 2013-02-19 NOTE — ED Provider Notes (Signed)
Medical screening examination/treatment/procedure(s) were performed by non-physician practitioner and as supervising physician I was immediately available for consultation/collaboration.  Clenton Esper L Tarynn Garling, MD 02/19/13 1913 

## 2013-02-19 NOTE — ED Notes (Signed)
Reports falling today and now having pain to right ankle.

## 2013-02-19 NOTE — ED Notes (Signed)
Pt still out of the department at this time 

## 2013-02-19 NOTE — ED Provider Notes (Signed)
History     CSN: [id number]  Arrival date & time 02/19/13  1233   First MD Initiated Contact with Patient 02/19/13 1308      Chief Complaint  Patient presents with  . Fall  . Ankle Pain    (Consider location/radiation/quality/duration/timing/severity/associated sxs/prior treatment) HPI  Carol [name] is a 27 y.o. female complaining of ankle pain status post slip and fall at doctor's office this a.m. Patient denies numbness and paresthesia. She rates her pain 8/10, and is exacerbated by weightbearing. She has a swelling to the lateral dorsum of the right foot. She denies any head trauma, LOC, prior injury to the affected limb.  Past Medical History  Diagnosis Date  . MS (multiple sclerosis)   . Multiple sclerosis 01/12/2013  . Low back pain 01/20/2013    History reviewed. No pertinent past surgical history.  History reviewed. No pertinent family history.  History  Substance Use Topics  . Smoking status: Never Smoker   . Smokeless tobacco: Not on file  . Alcohol Use: No    OB History   Grav Para Term Preterm Abortions TAB SAB Ect Mult Living                  Review of Systems  Constitutional: Negative for fever.  Respiratory: Negative for shortness of breath.   Cardiovascular: Negative for chest pain.  Gastrointestinal: Negative for nausea, vomiting, abdominal pain and diarrhea.  Musculoskeletal: Positive for arthralgias.  All other systems reviewed and are negative.    Allergies  Review of patient's allergies indicates no known allergies.  Home Medications   Current Outpatient Rx  Name  Route  Sig  Dispense  Refill  . gabapentin (NEURONTIN) 300 MG capsule   Oral   Take 1 capsule (300 mg total) by mouth 3 (three) times daily.   90 capsule   12   . naproxen (EC NAPROSYN) 500 MG EC tablet   Oral   Take 1 tablet (500 mg total) by mouth 2 (two) times daily with a meal.   15 tablet   0   . oxyCODONE-acetaminophen (PERCOCET/ROXICET) 5-325 MG per tablet  Oral   Take 1 tablet by mouth every 6 (six) hours as needed for pain.   20 tablet   0     BP 130/89  Pulse 80  Temp(Src) 97.7 F (36.5 C) (Oral)  Resp 20  SpO2 100%  LMP 02/02/2013  Physical Exam  Nursing note and vitals reviewed. Constitutional: She is oriented to person, place, and time. She appears well-developed and well-nourished. No distress.  HENT:  Head: Normocephalic.  Eyes: Conjunctivae and EOM are normal.  Cardiovascular: Normal rate.   Pulmonary/Chest: Effort normal. No stridor.  Musculoskeletal: Normal range of motion.       Feet:  Mild swelling and tenderness to palpation to dorsum of lateral right foot. Neurovascularly intact, good range of motion to all toes.  Neurological: She is alert and oriented to person, place, and time.  Psychiatric: She has a normal mood and affect.    ED Course  Procedures (including critical care time)  Labs Reviewed - No data to display Dg Ankle Complete Right  02/19/2013  *RADIOLOGY REPORT*  Clinical Data: Fall, ankle pain  RIGHT ANKLE - COMPLETE 3+ VIEW  Comparison: None.  Findings: No fracture or dislocation is seen.  Tiny density along the dorsum of the midfoot on the frontal view, without corresponding abnormality of any additional view of the ankle or foot, of questionable significance.  The  ankle mortise is intact.  The base of the fifth metatarsal is unremarkable.  Visualized soft tissues are grossly unremarkable.  IMPRESSION: No fracture or dislocation is seen.   Original Report Authenticated By: Charline Bills, M.D.    Dg Foot Complete Right  02/19/2013  *RADIOLOGY REPORT*  Clinical Data: Fall, pain/swelling over third through fifth metatarsals  RIGHT FOOT COMPLETE - 3+ VIEW  Comparison: None.  Findings: No fracture or dislocation is seen.  The joint spaces are preserved.  The visualized soft tissues are unremarkable.  IMPRESSION: No fracture or dislocation is seen.   Original Report Authenticated By: Charline Bills, M.D.       1. Ankle sprain, right, initial encounter       MDM   Carol [name] is a 27 y.o. female with pain and swelling to left ankle/foot status post twisting it earlier in the day. X-ray showed no bony abnormalities. I will treat like a sprain. Return precautions given   Filed Vitals:   02/19/13 1238  BP: 130/89  Pulse: 80  Temp: 97.7 F (36.5 C)  TempSrc: Oral  Resp: 20  SpO2: 100%     Pt verbalized understanding and agrees with care plan. Outpatient follow-up and return precautions given.    New Prescriptions   HYDROCODONE-ACETAMINOPHEN (NORCO/VICODIN) 5-325 MG PER TABLET    Take 1-2 tablets by mouth every 6 hours as needed for pain.           Wynetta Emery, PA-C 02/19/13 1418

## 2013-02-19 NOTE — Progress Notes (Signed)
Clinical history: Carol [name] is a 27 year old right-handed female is enrolled in [place], randomized to [place], in March 5th 2014.   In March 2013, she developed new-onset right eye pain and blurred vision. She was initially diagnosed with corneal abrasion. She went to the emergency room twice for this. She then followed up with ophthalmology, who ordered MRI of the brain and orbits. This showed acute right optic neuritis and 7-8 chronic to moderate plaques. She was diagnosed with probable multiple sclerosis   Around beginning of December 2013, patient developed new symptoms of numbness in her bilateral fingertips and bilateral toes, lasted for 2 weeks.  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 Patient also has intermittent muscle spasms and balance difficulty. No family history of MS.  MRI brain showed approximately six lesions in the brain which are consistent with multiple sclerosis. there is abnormality in the right optic nerve consistent with  multiple sclerosis, see below report.  No enhancing lesions are identified.   MRI ORBITS: Mild swelling of hyperintensity in the right optic nerve is present.  This shows  abnormal  enhancement with slight stranding in the surrounding orbital fat suggesting acute inflammation.  Enhancement is most prominent within the optic canal on the right.  Optic chiasm is normal. The left optic nerve is normal.  She now complains of right low back pain, radiating pain to right leg,  bilateral hands, and feet paresthesia,  UPDATE May 2nd 2014: She came in today for re-evaluation to refill paperwork required by [employer], where she works. We previously filled the paperwork because she complains of right sided low back pain, radiating pain to her right leg, I have put the limitation of working 5 hours each day, patient reported significant improvement over the past few  weeks, she wants to go back full time, She denies significant low back pain, no significant gait difficulty prior to examination.  I have performed hopping exam according to EDSS protocol, she did well standing on left leg alone, hopped 11 times, she was able to stand on her right leg without difficulty, while hopping, about 4 times, without warning signs, she fell, landed on her right side, she denies significant low back pain, no right back pain, but she complains of right lateral foot, lateral ankle pain and swelling,  Physical Exam  General: normal Head: normal Ears, Nose and Throat: normal Neck: supple no carotid bruits Respiratory: clear to auscultation bilaterally Cardiovascular: regular rate rhythm Musculoskeletal: normal Skin: normal Trunk: normal  Neurologic Exam  Mental Status: pleasant, awake, alert, cooperative to history taking, and casual conversation. Cranial Nerves: CN II-XII pupils were equal round reactive to light.  Fundi were sharp bilaterally.  Extraocular movements were full.  Visual fields were full on confrontational test.  Facial sensation and strength were normal.  Hearing was intact to finger rubbing bilaterally.  Uvula tongue were midline.  Head turning and shoulder shrugging were normal and symmetric.  Tongue protrusion into the cheeks strength were normal.  Motor: She denies significant low back pain, bilateral hip flexion, knee flexion, knee extension, ankle dorsiflexion, ankle plantar flexion strengths were normal and symmetric Sensory: Normal to light touch, pinprick, proprioception, and vibratory sensation. Coordination: Normal finger-to-nose, heel-to-shin.  There was no dysmetria noticed. Gait and Station: She was able to walk without difficulty, able to, tiptoe, heel, tandem walking without difficulty, Romberg sign: Negative Reflexes: Deep tendon reflexes: Biceps: 2/2, Brachioradialis: 2/2, Triceps: 2/2, Pateller:  2/2, Achilles: 2/2.  Plantar responses are  extensor bilaterally.  She was able to stand on her right leg without difficulty, fell while hopping 4 times, on her right leg, without warning signs, she stated that her right leg gave out underneath her, she has tenderness of right dorsum foot, swelling, tender to touch.  Assessment and plan: 27 years old with relapsing remitting multiple sclerosis, s/p fall with right dorsum foot swelling, tenderness to touch.  1. refer her to emergency room for evaluation . 2.  continue follow up according to protocol .

## 2013-02-19 NOTE — ED Notes (Signed)
Pt getting undressed; family at bedside

## 2013-03-17 ENCOUNTER — Ambulatory Visit (INDEPENDENT_AMBULATORY_CARE_PROVIDER_SITE_OTHER): Payer: Self-pay | Admitting: Neurology

## 2013-03-17 VITALS — BP 122/81 | HR 73 | Temp 98.6°F | Resp 18 | Wt 213.0 lb

## 2013-03-17 DIAGNOSIS — M545 Low back pain: Secondary | ICD-10-CM

## 2013-03-17 DIAGNOSIS — G35 Multiple sclerosis: Secondary | ICD-10-CM

## 2013-03-18 NOTE — Progress Notes (Signed)
Pt seen today for V4 month 3 PreferMS study visit. Pt is randomized to Fingolimod and tolerating study medication. Pt has had no adverse events since last visit and has no new or worsening MS symptoms today.  Next study visit and MRI appointments made and given to pt. Labs, Pros completed as per protocol. Dr.Yan in to complete bradycardia and ambulation assessment. neuro and physical exam. Patient was dispensed bottle # [id number], [id number] and [id number]. Patient was give FYI medication guide.

## 2013-03-19 ENCOUNTER — Encounter: Payer: Self-pay | Admitting: Neurology

## 2013-03-19 NOTE — Progress Notes (Signed)
Clinical history: Carol [name] is a 27 year old right-handed female is enrolled in [place], randomized to [place], in March 5th 2014.   In March 2013, she developed new-onset right eye pain and blurred vision. She was initially diagnosed with corneal abrasion. She went to the emergency room twice for this. She then followed up with ophthalmology, who ordered MRI of the brain and orbits. This showed acute right optic neuritis and 7-8 chronic to moderate plaques. She was diagnosed with probable multiple sclerosis   Around beginning of December 2013, patient developed new symptoms of numbness in her bilateral fingertips and bilateral toes, lasted for 2 weeks.  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 Patient also has intermittent muscle spasms and balance difficulty. No family history of MS.  MRI brain showed approximately six lesions in the brain which are consistent with multiple sclerosis. there is abnormality in the right optic nerve consistent with  multiple sclerosis, see below report.  No enhancing lesions are identified.   MRI ORBITS: Mild swelling of hyperintensity in the right optic nerve is present.  This shows  abnormal  enhancement with slight stranding in the surrounding orbital fat suggesting acute inflammation.  Enhancement is most prominent within the optic canal on the right.  Optic chiasm is normal. The left optic nerve is normal.  She now complains of right low back pain, radiating pain to right leg,  bilateral hands, and feet paresthesia,  UPDATE May 28th 2014:  Her right foot and ankle swelling has much improved, but she still use wrap, low back pain has improved, she wants to go back to work 27 hours each week.  She is tolerating Gilenya. No relapse  Physical Exam  General: normal Head: normal Ears, Nose and Throat: normal Neck: supple no carotid bruits Respiratory: clear to  auscultation bilaterally Cardiovascular: regular rate rhythm Musculoskeletal: normal Skin: normal Trunk: normal  Neurologic Exam  Mental Status: pleasant, awake, alert, cooperative to history taking, and casual conversation. Cranial Nerves: CN II-XII pupils were equal round reactive to light.  Fundi were sharp bilaterally.  Extraocular movements were full.  Visual fields were full on confrontational test.  Facial sensation and strength were normal.  Hearing was intact to finger rubbing bilaterally.  Uvula tongue were midline.  Head turning and shoulder shrugging were normal and symmetric.  Tongue protrusion into the cheeks strength were normal.  Motor: She denies significant low back pain, bilateral hip flexion, knee flexion, knee extension, ankle dorsiflexion, ankle plantar flexion strengths were normal and symmetric.  She still has wrap at right foot, mild tenderness upon deep palpitation. Sensory: Normal to light touch, pinprick, proprioception, and vibratory sensation. Coordination: Normal finger-to-nose, heel-to-shin.  There was no dysmetria noticed. Gait and Station: She was able to walk without difficulty, able to, tiptoe, heel, tandem walking without difficulty, Romberg sign: Negative Reflexes: Deep tendon reflexes: Biceps: 2/2, Brachioradialis: 2/2, Triceps: 2/2, Pateller: 2/2, Achilles: 2/2.  Plantar responses are extensor bilaterally.  She was able to stand on her right leg without difficulty, fell while hopping 4 times, on her right leg, without warning signs, she stated that her right leg gave out underneath her, she has tenderness of right dorsum foot, swelling, tender to touch.  Assessment and plan: 27 years old with relapsing remitting multiple sclerosis, s/p fall with right dorsum foot swelling, tenderness to touch.  Continue Gilenya, follow up according to protocol.

## 2013-04-22 ENCOUNTER — Other Ambulatory Visit: Payer: Self-pay | Admitting: Neurology

## 2013-04-22 DIAGNOSIS — G35 Multiple sclerosis: Secondary | ICD-10-CM

## 2013-06-09 ENCOUNTER — Ambulatory Visit (INDEPENDENT_AMBULATORY_CARE_PROVIDER_SITE_OTHER): Payer: Self-pay

## 2013-06-09 ENCOUNTER — Encounter: Payer: Self-pay | Admitting: Neurology

## 2013-06-09 ENCOUNTER — Ambulatory Visit (INDEPENDENT_AMBULATORY_CARE_PROVIDER_SITE_OTHER): Payer: Self-pay | Admitting: Neurology

## 2013-06-09 VITALS — BP 124/87 | HR 62 | Temp 97.9°F | Resp 19 | Wt 208.6 lb

## 2013-06-09 DIAGNOSIS — G35 Multiple sclerosis: Secondary | ICD-10-CM

## 2013-06-09 NOTE — Progress Notes (Signed)
Clinical history:   Carol [name] is a 27 year old right-handed female is enrolled in [place], randomized to [place], in March 5th 2014.   In March 2013, she developed new-onset right eye pain and blurred vision. She was initially diagnosed with corneal abrasion. She went to the emergency room twice for this. She then followed up with ophthalmology, who ordered MRI of the brain and orbits. This showed acute right optic neuritis and 7-8 chronic to moderate plaques. She was diagnosed with probable multiple sclerosis   Around beginning of December 2013, patient developed new symptoms of numbness in her bilateral fingertips and bilateral toes, lasted for 2 weeks.  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 Patient also has intermittent muscle spasms and balance difficulty. No family history of MS.  MRI brain showed approximately six lesions in the brain which are consistent with multiple sclerosis. there is abnormality in the right optic nerve consistent with  multiple sclerosis, see below report.  No enhancing lesions are identified.   MRI ORBITS: Mild swelling of hyperintensity in the right optic nerve is present.  This shows  abnormal  enhancement with slight stranding in the surrounding orbital fat suggesting acute inflammation.  Enhancement is most prominent within the optic canal on the right.  Optic chiasm is normal. The left optic nerve is normal.  She now complains of right low back pain, radiating pain to right leg,  bilateral hands, and feet paresthesia,  UPDATE August 20th 2014: She is tolerating Gilenya. No relapse, she continue to have mild gait difficulty, muscle spasm at lower extremity, is taking gabapentin 300 mg 3 times a day, which has been very helpful, she works full time at [employer],   Physical Exam  General: normal Head: normal Ears, Nose and Throat: normal Neck: supple no  carotid bruits Respiratory: clear to auscultation bilaterally Cardiovascular: regular rate rhythm Musculoskeletal: normal Skin: normal Trunk: normal  Neurologic Exam  Mental Status: pleasant, awake, alert, cooperative to history taking, and casual conversation. Cranial Nerves: CN II-XII pupils were equal round reactive to light.  Fundi were sharp bilaterally.  Extraocular movements were full.  Visual fields were full on confrontational test.  Facial sensation and strength were normal.  Hearing was intact to finger rubbing bilaterally.  Uvula tongue were midline.  Head turning and shoulder shrugging were normal and symmetric.  Tongue protrusion into the cheeks strength were normal.  Motor: She has mild spasticity of bilateral lower extremity, no significant muscle atrophy or weakness bilateral upper extremity motor strength was normal.  Sensory: Normal to light touch, pinprick, proprioception, and vibratory sensation. Coordination: Normal finger-to-nose, heel-to-shin.  There was no dysmetria noticed. Gait and Station: Normal cautious, stance gait, she was able to walk on heels, tiptoe. Romberg sign: Negative Reflexes: Deep tendon reflexes: Biceps: 2/2, Brachioradialis: 2/2, Triceps: 2/2, Pateller: 2/2, Achilles: 2/2.  Plantar responses are extensor bilaterally.  Assessment and plan: 27 years old with relapsing remitting multiple sclerosis,  continued to have mild  gait difficulty at baseline, mild bilateral lower extremity spasticity,  she should not lifting more than 25 pounds, she should not work more than 34 hours each week .  Continue Gilenya, follow up according to protocol.

## 2013-06-09 NOTE — Progress Notes (Signed)
Patient here for his Visit 5 month 6 in PREFER MS study. Pt is randomized to Fingolimod and tolerating study medication. Patient had MRI done today as well. Next study visit appointments made and given to pt. Labs, Pros, MSQLI and SDMT was completed as per protocol. Dr.Yan in to complete ambulation, bradycardia assessed, neuro and physical exam. Patient was give FYI medication guide. Study drug bottles was returned with 19 pills and new study drug bottles was dispensed to patient.

## 2013-06-10 ENCOUNTER — Encounter: Payer: Self-pay | Admitting: Neurology

## 2013-06-10 ENCOUNTER — Other Ambulatory Visit: Payer: Self-pay

## 2013-06-10 MED ORDER — GADOPENTETATE DIMEGLUMINE 469.01 MG/ML IV SOLN: 20.0000 mL | Freq: Once | INTRAVENOUS | Status: AC | PRN

## 2013-06-10 NOTE — Progress Notes (Signed)
Quick Note:  Please call patient, MRI brain continue to show evidence of MS, no significant change compared to previous study ______

## 2013-06-25 ENCOUNTER — Ambulatory Visit (INDEPENDENT_AMBULATORY_CARE_PROVIDER_SITE_OTHER): Payer: Self-pay | Admitting: Neurology

## 2013-06-25 ENCOUNTER — Encounter: Payer: Self-pay | Admitting: Neurology

## 2013-06-25 VITALS — BP 118/85 | HR 71 | Temp 97.9°F | Resp 18 | Wt 209.2 lb

## 2013-06-25 DIAGNOSIS — G35 Multiple sclerosis: Secondary | ICD-10-CM

## 2013-06-25 DIAGNOSIS — I809 Phlebitis and thrombophlebitis of unspecified site: Secondary | ICD-10-CM

## 2013-06-25 NOTE — Progress Notes (Signed)
Clinical history:   Carol [name] is a 27 year old right-handed female is enrolled in [place], randomized to Gilenya, in March 5th 2014.   In March 2013, she developed new-onset right eye pain and blurred vision. She was initially diagnosed with corneal abrasion. She went to the emergency room twice for this. She then followed up with ophthalmology, who ordered MRI of the brain and orbits. This showed acute right optic neuritis and 7-8 chronic to moderate plaques. She was diagnosed with probable multiple sclerosis   Around beginning of December 2013, patient developed new symptoms of numbness in her bilateral fingertips and bilateral toes, lasted for 2 weeks.  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 Patient also has intermittent muscle spasms and balance difficulty. No family history of MS.  MRI brain showed approximately six lesions in the brain which are consistent with multiple sclerosis. there is abnormality in the right optic nerve consistent with  multiple sclerosis, see below report.  No enhancing lesions are identified.   MRI ORBITS: Mild swelling of hyperintensity in the right optic nerve is present.  This shows  abnormal  enhancement with slight stranding in the surrounding orbital fat suggesting acute inflammation.  Enhancement is most prominent within the optic canal on the right.  Optic chiasm is normal. The left optic nerve is normal.  She now complains of right low back pain, radiating pain to right leg,  bilateral hands, and feet paresthesia,  UPDATE August 20th 2014: She is tolerating Gilenya. No relapse, she continue to have mild gait difficulty, muscle spasm at lower extremity, is taking gabapentin 300 mg 3 times a day, which has been very helpful, she works full time at [employer],   UPDATE Sept 5th 2014:  She came in for evaluation of left dorsum hand discomfort since her iv  insertion for contrast dye in June 10, 2013, there was no pain, no erythematous, mild movable superfacial vein, likely superfacial phlebitis.  MRI  Brain in June 10 2013  showing multiple periventricular, periatrial, corpus callosal and white matter hyperintensities consistent with multiple sclerosis. No enhancing lesions are noted. Overall significant progression of white matter lesions compared with previous MRI scan dated 01/23/2012  I have suggested hot compression, hope to improve in the next few weeks

## 2013-08-20 ENCOUNTER — Emergency Department (HOSPITAL_COMMUNITY)
Admission: EM | Admit: 2013-08-20 | Discharge: 2013-08-20 | Disposition: A | Discharge status: Home / Self Care | Payer: Self-pay | Attending: Emergency Medicine | Admitting: Emergency Medicine

## 2013-08-20 ENCOUNTER — Encounter (HOSPITAL_COMMUNITY): Payer: Self-pay | Admitting: Emergency Medicine

## 2013-08-20 DIAGNOSIS — Z8669 Personal history of other diseases of the nervous system and sense organs: Secondary | ICD-10-CM | POA: Insufficient documentation

## 2013-08-20 DIAGNOSIS — Z79899 Other long term (current) drug therapy: Secondary | ICD-10-CM | POA: Insufficient documentation

## 2013-08-20 DIAGNOSIS — K1379 Other lesions of oral mucosa: Secondary | ICD-10-CM

## 2013-08-20 DIAGNOSIS — K068 Other specified disorders of gingiva and edentulous alveolar ridge: Secondary | ICD-10-CM

## 2013-08-20 DIAGNOSIS — K055 Other periodontal diseases: Secondary | ICD-10-CM | POA: Insufficient documentation

## 2013-08-20 DIAGNOSIS — K1370 Unspecified lesions of oral mucosa: Secondary | ICD-10-CM | POA: Insufficient documentation

## 2013-08-20 NOTE — ED Notes (Signed)
Pt reports she woke the past few days with dental pain and when she spits the first time in the morning after waking there was a small amount of blood in it. States she clenches her teeth at night. No cough, fevers, sob or cp.

## 2013-08-20 NOTE — ED Provider Notes (Signed)
CSN: [id number]     Arrival date & time 08/20/13  1514 History  This chart was scribed for Jaynie Crumble, PA working with Shelda Jakes, MD by Quintella Reichert, ED Scribe. This patient was seen in room TR05C/TR05C and the patient's care was started at 3:39 PM.   Chief Complaint  Patient presents with  . Dental Pain    The history is provided by the patient. No language interpreter was used.    HPI Comments: Carol [name] is a 27 y.o. female who presents to the Emergency Department complaining of several days of dental pain and spitting up blood in the morning.  Pt states that for the past 3-4 days she has been spitting out blood after waking up.  She spits out blood only first thing in the morning and this does not occur throughout the rest of the day.  She also complains of mild pain to her back teeth which lasts for several hours after waking and then resolves.  She denies fever, sore throat, epistaxis, hemoptysis, or hematemesis.  She states that she may clench her teeth at night and notes that her gums sometimes bleed when she brushes them..  Admits to some gum bleeding when brushing teeth. States googled her symptoms and it said she may have cancer. She has not seen a dentist.     Past Medical History  Diagnosis Date  . MS (multiple sclerosis)   . Multiple sclerosis 01/12/2013  . Low back pain 01/20/2013    History reviewed. No pertinent past surgical history.  History reviewed. No pertinent family history.   History  Substance Use Topics  . Smoking status: Never Smoker   . Smokeless tobacco: Not on file  . Alcohol Use: No    OB History   Grav Para Term Preterm Abortions TAB SAB Ect Mult Living                  Review of Systems  Constitutional: Negative for fever.  HENT: Positive for dental problem. Negative for nosebleeds.        Spitting out blood  Respiratory:       No hemoptysis  Gastrointestinal:       No hematemesis     Allergies  Review of  patient's allergies indicates no known allergies.  Home Medications   Current Outpatient Rx  Name  Route  Sig  Dispense  Refill  . Fingolimod HCl (GILENYA) 0.5 MG CAPS   Oral   Take 1 capsule by mouth daily.         Marland Kitchen gabapentin (NEURONTIN) 300 MG capsule   Oral   Take 1 capsule (300 mg total) by mouth 3 (three) times daily.   90 capsule   12    BP 113/65  Pulse 71  Temp(Src) 98.7 F (37.1 C) (Oral)  Resp 16  Ht 5\' 10"  (1.778 m)  Wt 210 lb 14.4 oz (95.664 kg)  BMI 30.26 kg/m2  SpO2 100%  Physical Exam  Nursing note and vitals reviewed. Constitutional: She is oriented to person, place, and time. She appears well-developed and well-nourished. No distress.  HENT:  Head: Normocephalic and atraumatic.  Several fillings in teeth Gums, teeth and pharynx appear normal Nose appears normal  Eyes: EOM are normal.  Neck: Neck supple. No tracheal deviation present.  Cardiovascular: Normal rate.   Pulmonary/Chest: Effort normal. No respiratory distress.  Musculoskeletal: Normal range of motion.  Neurological: She is alert and oriented to person, place, and time.  Skin: Skin  is warm and dry.  Psychiatric: She has a normal mood and affect. Her behavior is normal.    ED Course  Procedures (including critical care time)  DIAGNOSTIC STUDIES: Oxygen Saturation is 100% on room air, normal by my interpretation.    COORDINATION OF CARE: 3:45 PM-Discussed treatment plan which includes dental referral with pt at bedside and pt agreed to plan.    Labs Review Labs Reviewed - No data to display  Imaging Review No results found.  EKG Interpretation   None       MDM   1. Mouth pain   2. Bleeding gums     Patient with sore teeth and gum bleeding only in the morning. Only exam everything appears to be normal. There is no obvious gum disease or lesions. Teeth with prior dental work but otherwise normal. Pharynx is normal. Nasal cavity is normal with no signs of bleeding.  Patient does have history of MS. No associated symptoms. I advised her that it is probably the best idea to go see a dentist. It is possible that she drains her teeth at night. Advised of symptoms that should prompt her return to emergency department     I personally performed the services described in this documentation, which was scribed in my presence. The recorded information has been reviewed and is accurate.   Lottie Mussel, PA-C 08/20/13 1557

## 2013-08-22 NOTE — ED Provider Notes (Signed)
Medical screening examination/treatment/procedure(s) were performed by non-physician practitioner and as supervising physician I was immediately available for consultation/collaboration.  EKG Interpretation   None         Shelda Jakes, MD 08/22/13 [id number]

## 2013-08-31 ENCOUNTER — Other Ambulatory Visit: Payer: Self-pay | Admitting: Neurology

## 2013-08-31 DIAGNOSIS — G35 Multiple sclerosis: Secondary | ICD-10-CM

## 2013-09-03 ENCOUNTER — Ambulatory Visit (INDEPENDENT_AMBULATORY_CARE_PROVIDER_SITE_OTHER): Payer: Self-pay | Admitting: Neurology

## 2013-09-03 VITALS — BP 119/83 | HR 62 | Temp 98.6°F | Resp 19 | Wt 205.0 lb

## 2013-09-03 DIAGNOSIS — G35 Multiple sclerosis: Secondary | ICD-10-CM

## 2013-09-03 DIAGNOSIS — Z0289 Encounter for other administrative examinations: Secondary | ICD-10-CM

## 2013-09-03 NOTE — Progress Notes (Signed)
Patient here for her Visit 6 month 9 in PREFER MS study.  Pt is randomized to Fingolimod and tolerating study medication. Pt has had no con med, AE's or SAE's since last visit . Next study visit appointments made and given to pt. Labs and PRO was completed as per protocol. Dr.Sethi in to complete bradycardia, ambulation assessment. neuro and physical exam. Patient continue to meet all inclusion and exclusion criteria to continue in study. Patient was give FYI medication guide. Study drug bottles was returned with 18 pills and new study drug bottles was dispensed to patient. MRI was also scheduled.

## 2013-11-17 ENCOUNTER — Encounter: Payer: Self-pay | Admitting: Neurology

## 2013-11-17 ENCOUNTER — Ambulatory Visit (INDEPENDENT_AMBULATORY_CARE_PROVIDER_SITE_OTHER): Payer: No Typology Code available for payment source

## 2013-11-17 ENCOUNTER — Other Ambulatory Visit: Payer: Self-pay

## 2013-11-17 ENCOUNTER — Encounter (INDEPENDENT_AMBULATORY_CARE_PROVIDER_SITE_OTHER): Payer: Self-pay

## 2013-11-17 ENCOUNTER — Ambulatory Visit (INDEPENDENT_AMBULATORY_CARE_PROVIDER_SITE_OTHER): Payer: No Typology Code available for payment source | Admitting: Neurology

## 2013-11-17 DIAGNOSIS — M545 Low back pain, unspecified: Secondary | ICD-10-CM

## 2013-11-17 DIAGNOSIS — G35 Multiple sclerosis: Secondary | ICD-10-CM

## 2013-11-17 MED ORDER — FINGOLIMOD HCL 0.5 MG PO CAPS: 1.0000 | ORAL_CAPSULE | Freq: Every day | ORAL | Status: DC

## 2013-11-17 MED ORDER — GADOPENTETATE DIMEGLUMINE 469.01 MG/ML IV SOLN: 20.0000 mL | Freq: Once | INTRAVENOUS | Status: AC | PRN

## 2013-11-17 NOTE — Progress Notes (Signed)
Clinical history:   Carol [name] is a 28 year-old right-handed female is enrolled in [facility], randomized to Gilenya, in March 5th 2014. This is the exit visit for her  HISTORY: In March 2013, she developed new-onset right eye pain and blurred vision. She was initially diagnosed with corneal abrasion. She went to the emergency room twice for this. She then followed up with ophthalmology, who ordered MRI of the brain and orbits. This showed acute right optic neuritis and 7-8 chronic to moderate plaques. She was diagnosed with probable multiple sclerosis   Around beginning of December 2013, patient developed new symptoms of numbness in her bilateral fingertips and bilateral toes, lasted for 2 weeks.  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 Patient also has intermittent muscle spasms and balance difficulty. No family history of MS.  MRI brain showed approximately six lesions in the brain which are consistent with multiple sclerosis. there is abnormality in the right optic nerve consistent with  multiple sclerosis, see below report.  No enhancing lesions are identified.   MRI ORBITS: Mild swelling of hyperintensity in the right optic nerve is present.  This shows  abnormal  enhancement with slight stranding in the surrounding orbital fat suggesting acute inflammation.  Enhancement is most prominent within the optic canal on the right.  Optic chiasm is normal. The left optic nerve is normal.  She now complains of right low back pain, radiating pain to right leg,  bilateral hands, and feet paresthesia,  MRI  Brain in June 10 2013  showing multiple periventricular, periatrial, corpus callosal and white matter hyperintensities consistent with multiple sclerosis. No enhancing lesions are noted. Overall significant progression of white matter lesions compared with previous MRI scan dated 01/23/2012  UPDATE Jan  28th 2015: She is tolerating Gilenya. No relapse, she denies gait difficulty, occasionally right hand paresthesia, her ankle is no longer hurting,  Physical Exam  General: normal Head: normal Ears, Nose and Throat: normal Neck: supple no carotid bruits Respiratory: clear to auscultation bilaterally Cardiovascular: regular rate rhythm Musculoskeletal: normal Skin: normal Trunk: normal  Neurologic Exam  Mental Status: pleasant, awake, alert, cooperative to history taking, and casual conversation. Cranial Nerves: CN II-XII pupils were equal round reactive to light.  Fundi were sharp bilaterally.  Extraocular movements were full.  Visual fields were full on confrontational test.  Facial sensation and strength were normal.  Hearing was intact to finger rubbing bilaterally.  Uvula tongue were midline.  Head turning and shoulder shrugging were normal and symmetric.  Tongue protrusion into the cheeks strength were normal.  Motor: She has mild spasticity of bilateral lower extremity, no significant muscle atrophy or weakness bilateral upper extremity motor strength was normal.  Sensory: Normal to light touch, pinprick, proprioception, and vibratory sensation. Coordination: Normal finger-to-nose, heel-to-shin.  There was no dysmetria noticed. Gait and Station: Normal cautious, stance gait, she was able to walk on heels, tiptoe. Romberg sign: Negative Reflexes: Deep tendon reflexes: Biceps: 2/2, Brachioradialis: 2/2, Triceps: 2/2, Pateller: 2/2, Achilles: 2/2.  Plantar responses are extensor bilaterally.  Assessment and plan: 28 years old with relapsing remitting multiple sclerosis,  mild bilateral lower extremity spasticity, otherwise doing very well, Continue Gilenya, follow up in 6 months

## 2013-11-18 NOTE — Progress Notes (Signed)
Quick Note:  Please call patient, MRI brain showed   Multiple supratentorial chronic demyelinating plaques. No acute plaques. Compared to prior MRI on 06/09/13, there are 2 new chronic plaques noted  in the current study (left peri-callosal and left periventricular), no change in treatment. ______

## 2013-11-26 NOTE — Progress Notes (Signed)
Quick Note:  Left message with MRI brain results, per Dr. Terrace ArabiaYan. Told patient to call with any further questions. ______

## 2013-12-01 ENCOUNTER — Telehealth: Payer: Self-pay

## 2013-12-01 NOTE — Telephone Encounter (Signed)
[employer] sent us a letter saying they have approved our request for coverage on Gilenya effective until 05/16/2014 Ref # [id number]

## 2014-02-11 ENCOUNTER — Telehealth: Payer: Self-pay

## 2014-02-11 MED ORDER — GABAPENTIN 300 MG PO CAPS
300.0000 mg | ORAL_CAPSULE | Freq: Three times a day (TID) | ORAL | Status: DC
Start: 2014-02-11 — End: 2014-09-12

## 2014-02-11 NOTE — Telephone Encounter (Signed)
Yes please do so 

## 2014-02-11 NOTE — Telephone Encounter (Signed)
Rx has been sent  

## 2014-02-11 NOTE — Telephone Encounter (Signed)
Patient called and states she will be out of her Gabapentin On Sunday. 02-13-2014.  Can A refill be called in for her Dr.Yan out of the office .

## 2014-02-14 NOTE — Telephone Encounter (Signed)
Rx was sent on 04/24 at 4:16pm

## 2014-05-26 ENCOUNTER — Telehealth: Payer: Self-pay | Admitting: Neurology

## 2014-05-26 NOTE — Telephone Encounter (Signed)
All requested info has been provided to ins.  Request currently under review.

## 2014-05-26 NOTE — Telephone Encounter (Signed)
Please advise. Thanks.  

## 2014-05-27 ENCOUNTER — Telehealth: Payer: Self-pay

## 2014-05-27 NOTE — Telephone Encounter (Signed)
Called patient back to advise PA was approved.  Got no answer.  Left message.

## 2014-05-27 NOTE — Telephone Encounter (Signed)
[employer] notified us they have approved our request for coverage on Gilenya effective until 11/26/2014 Ref # [id number]

## 2014-06-09 ENCOUNTER — Telehealth: Payer: Self-pay | Admitting: Neurology

## 2014-06-09 ENCOUNTER — Emergency Department (HOSPITAL_COMMUNITY)
Admission: EM | Admit: 2014-06-09 | Discharge: 2014-06-09 | Disposition: A | Discharge status: Home / Self Care | Payer: No Typology Code available for payment source | Attending: Emergency Medicine | Admitting: Emergency Medicine

## 2014-06-09 ENCOUNTER — Encounter (HOSPITAL_COMMUNITY): Payer: Self-pay | Admitting: Emergency Medicine

## 2014-06-09 DIAGNOSIS — R209 Unspecified disturbances of skin sensation: Secondary | ICD-10-CM | POA: Insufficient documentation

## 2014-06-09 DIAGNOSIS — G35 Multiple sclerosis: Secondary | ICD-10-CM | POA: Insufficient documentation

## 2014-06-09 DIAGNOSIS — Z79899 Other long term (current) drug therapy: Secondary | ICD-10-CM | POA: Diagnosis not present

## 2014-06-09 DIAGNOSIS — M79609 Pain in unspecified limb: Secondary | ICD-10-CM | POA: Insufficient documentation

## 2014-06-09 DIAGNOSIS — R202 Paresthesia of skin: Secondary | ICD-10-CM

## 2014-06-09 MED ORDER — PREDNISONE 10 MG PO TABS: 60.0000 mg | ORAL_TABLET | Freq: Every day | ORAL | Status: DC

## 2014-06-09 MED ORDER — PREDNISONE 20 MG PO TABS
60.0000 mg | ORAL_TABLET | Freq: Once | ORAL | Status: AC
  Administered 2014-06-09: 60 mg via ORAL
  Filled 2014-06-09: qty 3

## 2014-06-09 NOTE — Telephone Encounter (Signed)
Patient called wanting to schedule an appointment with Dr. Terrace Arabia as soon as possible due to burning in her whole left leg and her feet being numb. Please return call and advise.

## 2014-06-09 NOTE — Telephone Encounter (Signed)
Tried calling patient, no answer, message was left to return the call to discuss new symptoms.

## 2014-06-09 NOTE — ED Notes (Signed)
Pt st's she has had a burning sensation in left lateral leg off and on x's 3 days.  No swelling noted, denies injury.

## 2014-06-09 NOTE — ED Notes (Signed)
Per pt sts she has been having burning sensation in left foot radiating up her left leg x 3 days.

## 2014-06-10 ENCOUNTER — Ambulatory Visit (INDEPENDENT_AMBULATORY_CARE_PROVIDER_SITE_OTHER): Payer: No Typology Code available for payment source | Admitting: Adult Health

## 2014-06-10 ENCOUNTER — Encounter: Payer: Self-pay | Admitting: Adult Health

## 2014-06-10 VITALS — BP 118/80 | HR 70 | Ht 71.0 in | Wt 226.0 lb

## 2014-06-10 DIAGNOSIS — G35 Multiple sclerosis: Secondary | ICD-10-CM

## 2014-06-10 DIAGNOSIS — R200 Anesthesia of skin: Secondary | ICD-10-CM

## 2014-06-10 DIAGNOSIS — R202 Paresthesia of skin: Secondary | ICD-10-CM

## 2014-06-10 DIAGNOSIS — R209 Unspecified disturbances of skin sensation: Secondary | ICD-10-CM

## 2014-06-10 MED ORDER — GABAPENTIN 100 MG PO CAPS: 100.0000 mg | ORAL_CAPSULE | Freq: Three times a day (TID) | ORAL | Status: DC

## 2014-06-10 NOTE — Progress Notes (Signed)
PATIENT: Carol [name] DOB: 1986-01-29  REASON FOR VISIT: follow up HISTORY FROM: patient  HISTORY OF PRESENT ILLNESS: Carol [name] is a 28 year old female with a history of multiple sclerosis. She returns today for follow-up. She is currently taking Gilenya and is tolerating it well. She returns today for burning sensation that is affecting the left leg and numbness in her feet that started a couple of days ago. She states that it started in her foot and moved to her leg but today it is only in her foot. She states that the burning sensation is intermittent but notices it most when she is sitting down. She has had some numbness and tingling in her feet and fingertips in the past. She is currently taking gabapentin which controlled her initial symptoms. Denies trouble with gait and balance. No changes in her vision. Denies any weakness however she does have some weakness in the right knee that was present on diagnosis. Denies any trouble swallowing. She did go to the ED for these symptoms. There they prescribed prednisone 60 mg daily. She has not started taking this yet.  HISTORY 11/17/13 (YY); In March 2013, she developed new-onset right eye pain and blurred vision. She was initially diagnosed with corneal abrasion. She went to the emergency room twice for this. She then followed up with ophthalmology, who ordered MRI of the brain and orbits. This showed acute right optic neuritis and 7-8 chronic to moderate plaques. She was diagnosed with probable multiple sclerosis  Around beginning of December 2013, patient developed new symptoms of numbness in her bilateral fingertips and bilateral toes, lasted for 2 weeks.  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 Patient also has intermittent muscle spasms and balance difficulty. No family history of MS.  MRI brain showed approximately six lesions in the  brain which are consistent with multiple sclerosis. there is  abnormality in the right optic nerve consistent with multiple sclerosis, see below report. No enhancing lesions are identified.  MRI ORBITS: Mild swelling of hyperintensity in the right optic nerve is present. This shows abnormal enhancement with slight  stranding in the surrounding orbital fat suggesting acute inflammation. Enhancement is most prominent within the optic canal  on the right. Optic chiasm is normal. The left optic nerve is normal.  She now complains of right low back pain, radiating pain to right leg, bilateral hands, and feet paresthesia,  MRI Brain in June 10 2013 showing multiple periventricular, periatrial, corpus callosal and white matter hyperintensities consistent with multiple sclerosis. No enhancing lesions are noted. Overall significant progression of white matter lesions compared with previous MRI scan dated 01/23/2012  UPDATE Jan 28th 2015: She is tolerating Gilenya. No relapse, she denies gait difficulty, occasionally right hand paresthesia, her ankle is no longer hurting,    REVIEW OF SYSTEMS: Full 14 system review of systems performed and notable only for:  Constitutional: N/A  Eyes: N/A Ear/Nose/Throat: N/A  Skin: N/A  Cardiovascular: N/A  Respiratory: N/A  Gastrointestinal: N/A  Genitourinary: N/A Hematology/Lymphatic: N/A  Endocrine: N/A Musculoskeletal:N/A  Allergy/Immunology: N/A  Neurological: N/A Psychiatric: N/A Sleep: N/A   ALLERGIES: No Known Allergies  HOME MEDICATIONS: Outpatient Prescriptions Prior to Visit  Medication Sig Dispense Refill  . Fingolimod HCl (GILENYA) 0.5 MG CAPS Take 1 capsule (0.5 mg total) by mouth daily.  30 capsule  6  . gabapentin (NEURONTIN) 300 MG capsule Take 1 capsule (300 mg  total) by mouth 3 (three) times daily.  90 capsule  6  . predniSONE (DELTASONE) 10 MG tablet Take 6 tablets (60 mg total) by mouth daily.  30 tablet  0   No  facility-administered medications prior to visit.    PAST MEDICAL HISTORY: Past Medical History  Diagnosis Date  . MS (multiple sclerosis)   . Multiple sclerosis 01/12/2013  . Low back pain 01/20/2013    PAST SURGICAL HISTORY: Past Surgical History  Procedure Laterality Date  . None      FAMILY HISTORY: History reviewed. No pertinent family history.  SOCIAL HISTORY: History   Social History  . Marital Status: Married    Spouse Name: N/A    Number of Children: 0  . Years of Education: 12   Occupational History  . 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 Smoking status: Never Smoker   . Smokeless tobacco: Never Used  . Alcohol Use: No  . Drug Use: No  . Sexual Activity: Not on file   Other Topics Concern  . 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   Patient is right handed.   Patient has a high school education and is in college.   Patient does not drink caffeine.      PHYSICAL EXAM  Filed Vitals:   06/10/14 1455  BP: 118/80  Pulse: 70  Height: 5\' 11"  (1.803 m)  Weight: 226 lb (102.513 kg)   Body mass index is 31.53 kg/(m^2). Generalized: Well developed, in no acute distress   Neurological examination  Mentation: Alert oriented to time, place, history taking. Follows all commands speech and language fluent Cranial nerve II-XII: Pupils were equal round reactive to light. Extraocular movements were full, visual field were full on confrontational test. Facial sensation and strength were normal. Head turning and shoulder shrug  were normal and symmetric. Motor: The motor testing reveals 5 over 5 strength of all 4 extremities. Good symmetric motor tone is noted throughout.  Sensory: Sensory testing is intact to soft touch on all 4 extremities. No evidence of extinction is noted.  Coordination: Cerebellar testing reveals good finger-nose-finger and heel-to-shin  bilaterally.  Gait and station: Gait is normal. Tandem gait is normal. Romberg is negative. No drift is seen.  Reflexes: Deep tendon reflexes are symmetric and normal bilaterally.     DIAGNOSTIC DATA (LABS, IMAGING, TESTING) - I reviewed patient records, labs, notes, testing and imaging myself where available.  ASSESSMENT AND PLAN 28 y.o. year old female  has a past medical history of MS (multiple sclerosis); Multiple sclerosis (01/12/2013); and Low back pain (01/20/2013). here with:  1. Multiple sclerosis 2. Numbness and tingling   Patient has been having increased tingling in the left foot that has progressed to the leg. This tingling is intermittent and noticed mostly at rest. Her physical exam was unremarkable. Patient did go to the ED for her symptoms they prescribed prednisone 60 mg daily. The patient has not started taking this yet. I advised the patient to hold off on starting that. Instead we will increase her gabapentin dose to 400 mg TID. If her symptoms do not improve or worsen she should let us know. Patient should follow-up in 3 months or sooner if needed.    Butch PennyMegan Norvell Ureste, MSN, NP-C 06/10/2014, 2:59 PM [facility] [address], [address] [place], [place] [address] [phone]  Note: This document was  prepared with digital dictation and possible smart phrase technology. Any transcriptional errors that result from this process are unintentional.

## 2014-06-10 NOTE — Telephone Encounter (Signed)
Spoke with patient, per Dr Terrace ArabiaYan appointment scheduled with NP MM 06/10/14 at 3 pm.

## 2014-06-10 NOTE — Patient Instructions (Signed)

## 2014-06-10 NOTE — Progress Notes (Signed)
Reviewed, agree above 

## 2014-06-10 NOTE — Telephone Encounter (Signed)
Patient returning call to [name], please call her back and advise.

## 2014-06-11 NOTE — ED Provider Notes (Signed)
CSN: [id number]     Arrival date & time 06/09/14  1216 History   First MD Initiated Contact with Patient 06/09/14 1631     Chief Complaint  Patient presents with  . Leg Pain      HPI Patient reports a burning sensation in her left foot radiating up towards her left leg over the past 3 days.  She denies new week in her left lower extremity.  She has a history of multiple sclerosis but has never had many flares of this.  She states that this is how her symptoms began when her MS was diagnosed, however her symptoms had begun in the right lower extremity and was followed shortly thereafter by weakness.  She is concerned this could progress onto weakness of the left lower extremity.  No recent injury fall or trauma.   Past Medical History  Diagnosis Date  . MS (multiple sclerosis)   . Multiple sclerosis 01/12/2013  . Low back pain 01/20/2013   Past Surgical History  Procedure Laterality Date  . None     History reviewed. No pertinent family history. History  Substance Use Topics  . Smoking status: Never Smoker   . Smokeless tobacco: Never Used  . Alcohol Use: No   OB History   Grav Para Term Preterm Abortions TAB SAB Ect Mult Living                 Review of Systems  All other systems reviewed and are negative.     Allergies  Review of patient's allergies indicates no known allergies.  Home Medications   Prior to Admission medications   Medication Sig Start Date End Date Taking? Authorizing Provider  Fingolimod HCl (GILENYA) 0.5 MG CAPS Take 1 capsule (0.5 mg total) by mouth daily. 11/17/13  Yes Levert Feinstein, MD  gabapentin (NEURONTIN) 300 MG capsule Take 1 capsule (300 mg total) by mouth 3 (three) times daily. 02/11/14  Yes Levert Feinstein, MD  gabapentin (NEURONTIN) 100 MG capsule Take 1 capsule (100 mg total) by mouth 3 (three) times daily. 06/10/14   Butch Penny, NP  predniSONE (DELTASONE) 10 MG tablet Take 6 tablets (60 mg total) by mouth daily. 06/09/14   Lyanne Co, MD    BP 118/85  Pulse 68  Temp(Src) 98.6 F (37 C) (Oral)  Resp 21  Ht 5\' 10"  (1.778 m)  Wt 215 lb (97.523 kg)  BMI 30.85 kg/m2  SpO2 100%  LMP 06/09/2014 Physical Exam  Nursing note and vitals reviewed. Constitutional: She is oriented to person, place, and time. She appears well-developed and well-nourished. No distress.  HENT:  Head: Normocephalic and atraumatic.  Eyes: EOM are normal.  Neck: Normal range of motion.  Cardiovascular: Normal rate, regular rhythm and normal heart sounds.   Pulmonary/Chest: Effort normal and breath sounds normal.  Abdominal: Soft. She exhibits no distension. There is no tenderness.  Musculoskeletal: Normal range of motion.  Full range of motion of left hip, left knee, left ankle.  Normal PT and DP pulse in left foot.  No swelling of the left lower extremity as compared to the right.  No external signs of infection of the left lower extremity.  5 out of 5 strength in bilateral lower extremity major muscle groups  Neurological: She is alert and oriented to person, place, and time.  Skin: Skin is warm and dry.  Psychiatric: She has a normal mood and affect. Judgment normal.    ED Course  Procedures (including critical care time) Labs  Review Labs Reviewed - No data to display  Imaging Review No results found.   EKG Interpretation None      MDM   Final diagnoses:  Multiple sclerosis  Paresthesia of left foot    Suspect MS flare.  The patient be started on steroids.  She understands return to the ER for new or worsening symptoms.  She understands the importance of following up with her neurologist.  Suspect MS flare    Lyanne CoKevin M Areli Frary, MD 06/11/14 [id number]

## 2014-07-20 ENCOUNTER — Telehealth: Payer: Self-pay | Admitting: Adult Health

## 2014-07-20 MED ORDER — FINGOLIMOD HCL 0.5 MG PO CAPS: 1.0000 | ORAL_CAPSULE | Freq: Every day | ORAL | Status: DC

## 2014-07-20 NOTE — Telephone Encounter (Signed)
Patient stated [facility] stated new Rx is needed for Fingolimod HCl (GILENYA) 0.5 MG CAPS.  Please call anytime and may leave detailed message on voicemail.

## 2014-07-20 NOTE — Telephone Encounter (Signed)
Rx has been sent.  I called the patient back.  Got no answer.  Left message.  

## 2014-07-21 ENCOUNTER — Other Ambulatory Visit: Payer: Self-pay | Admitting: Neurology

## 2014-09-08 IMAGING — CR DG ANKLE COMPLETE 3+V*R*
3 series · 3 of 3 positions shown · non-contrast
Comparison: None.

CLINICAL DATA: Fall, ankle pain

RIGHT ANKLE - COMPLETE 3+ VIEW

[t ankle joint ap right]
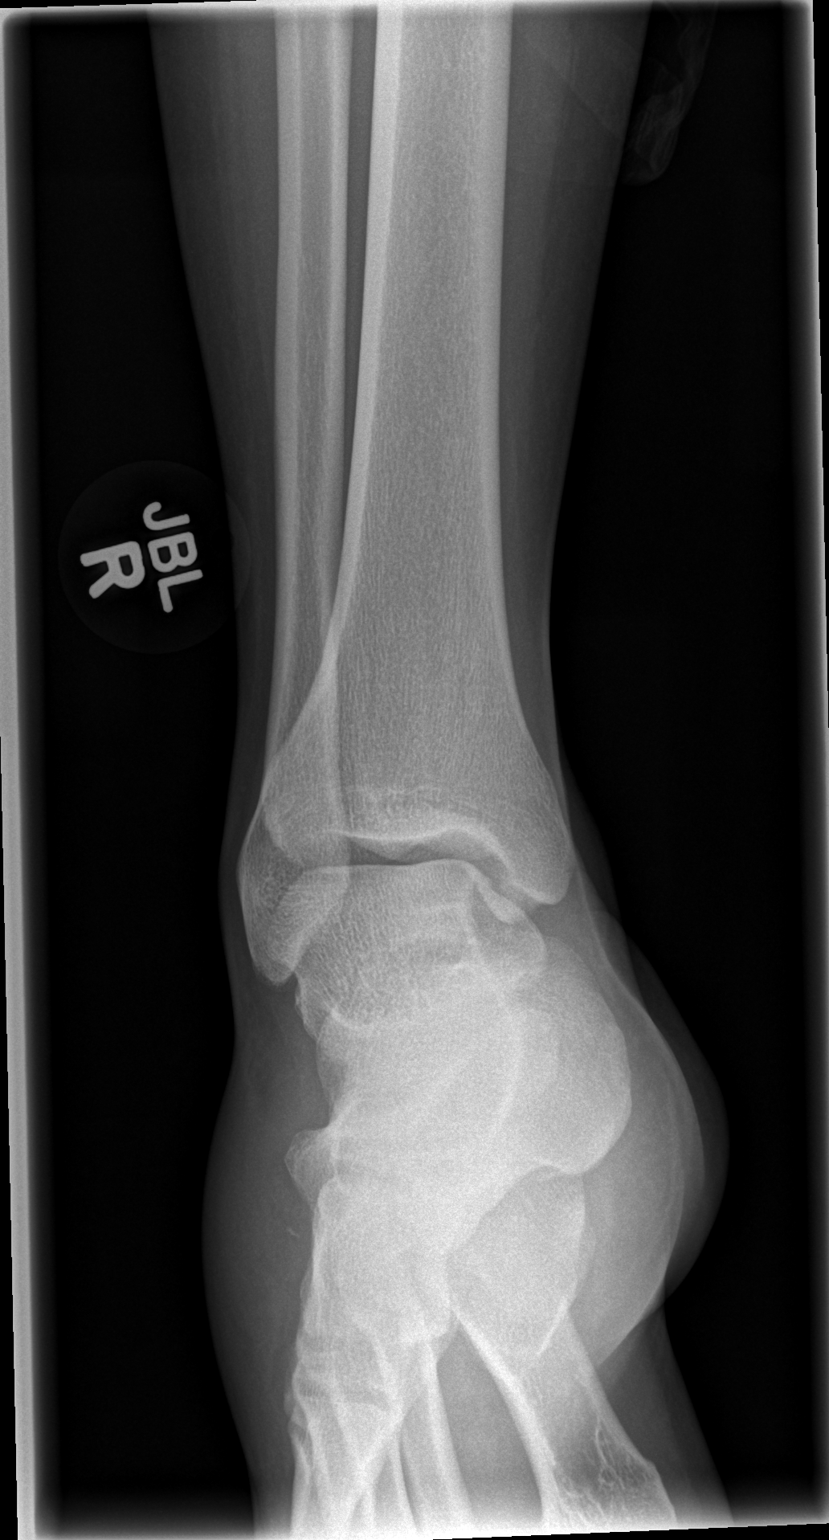

[t ankle joint oblique right]
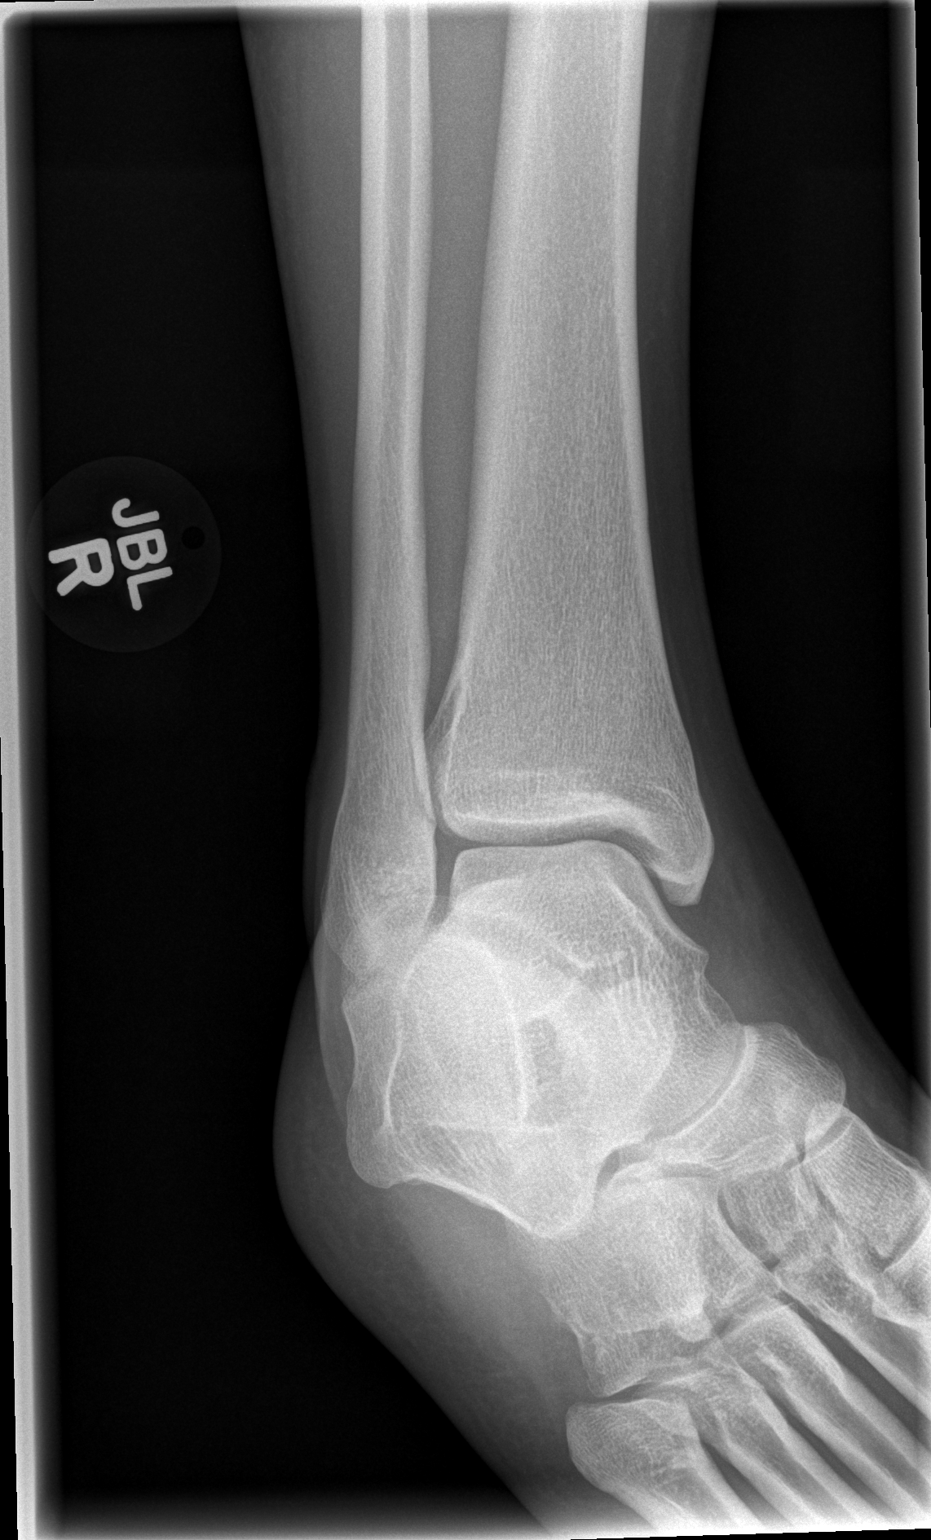

[t ankle joint lat right]
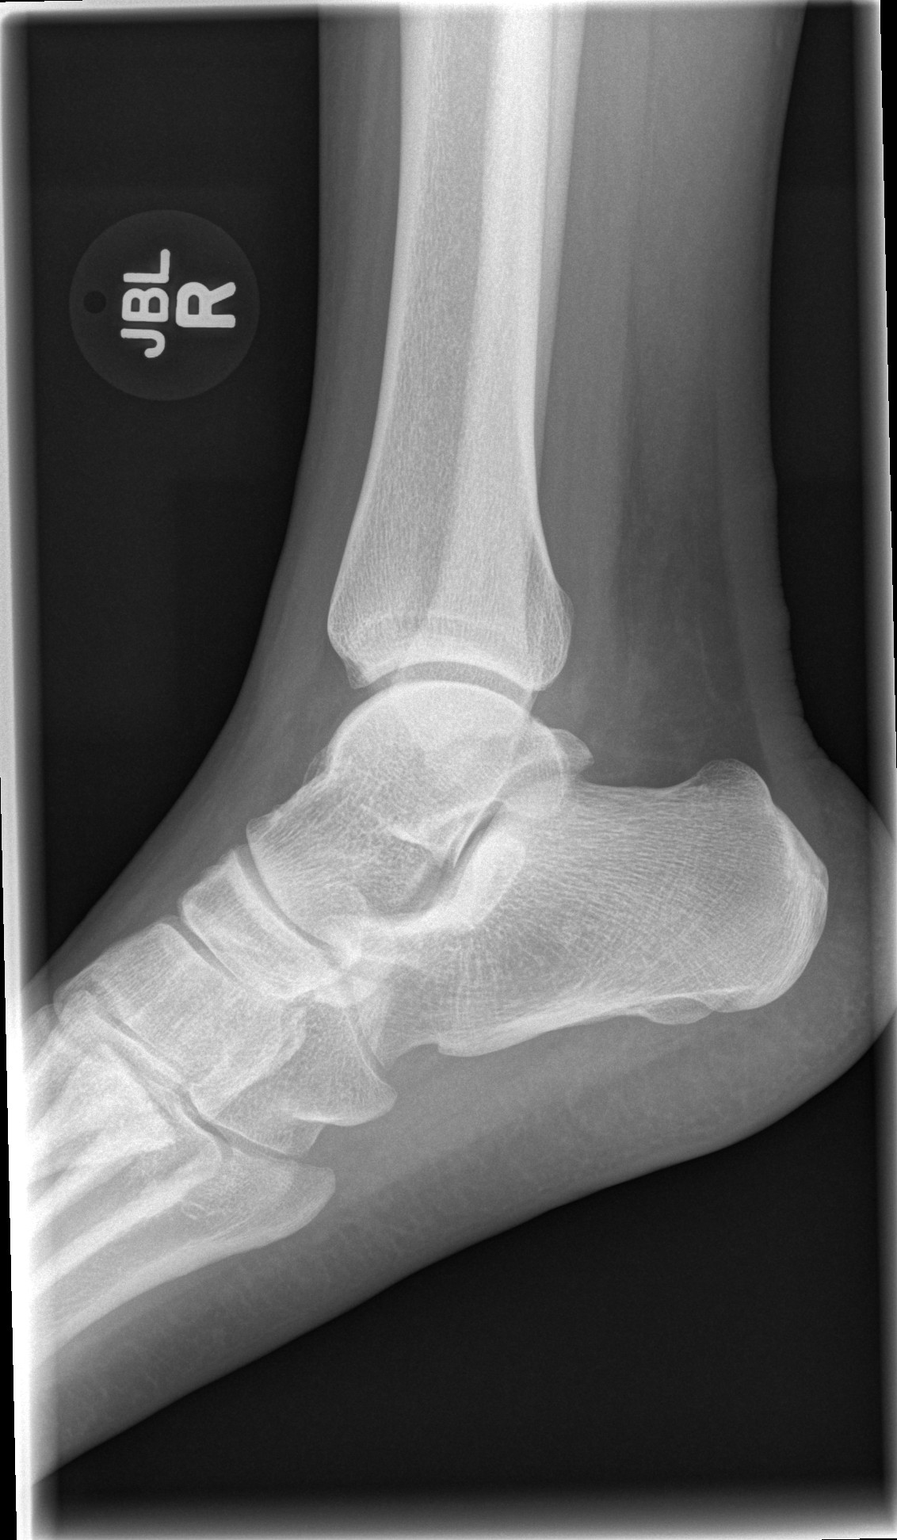

[3 of 3 positions shown; findings below may reference images not displayed]

FINDINGS: No fracture or dislocation is seen.

Tiny density along the dorsum of the midfoot on the frontal view,
without corresponding abnormality of any additional view of the
ankle or foot, of questionable significance.

The ankle mortise is intact.

The base of the fifth metatarsal is unremarkable.

Visualized soft tissues are grossly unremarkable.
IMPRESSION: No fracture or dislocation is seen.

## 2014-09-12 ENCOUNTER — Ambulatory Visit (INDEPENDENT_AMBULATORY_CARE_PROVIDER_SITE_OTHER): Payer: No Typology Code available for payment source | Admitting: Adult Health

## 2014-09-12 ENCOUNTER — Ambulatory Visit: Payer: No Typology Code available for payment source | Admitting: Adult Health

## 2014-09-12 ENCOUNTER — Encounter: Payer: Self-pay | Admitting: Adult Health

## 2014-09-12 VITALS — BP 121/89 | HR 84 | Temp 97.0°F | Ht 71.0 in | Wt 229.0 lb

## 2014-09-12 DIAGNOSIS — G35 Multiple sclerosis: Secondary | ICD-10-CM

## 2014-09-12 DIAGNOSIS — Z5181 Encounter for therapeutic drug level monitoring: Secondary | ICD-10-CM

## 2014-09-12 MED ORDER — GABAPENTIN 100 MG PO CAPS: 100.0000 mg | ORAL_CAPSULE | Freq: Three times a day (TID) | ORAL | Status: DC

## 2014-09-12 MED ORDER — GABAPENTIN 300 MG PO CAPS: 300.0000 mg | ORAL_CAPSULE | Freq: Three times a day (TID) | ORAL | Status: DC

## 2014-09-12 NOTE — Progress Notes (Signed)
I have read the note, and I agree with the clinical assessment and plan.  Carol [name] KEITH   

## 2014-09-12 NOTE — Patient Instructions (Signed)

## 2014-09-12 NOTE — Progress Notes (Signed)
PATIENT: Carol [name] DOB: [birth year]  REASON FOR VISIT: follow up HISTORY FROM: patient  HISTORY OF PRESENT ILLNESS: Carol [name] is a 28 year old female with a history of multiple sclerosis. She returns today for follow-up. She is currently taking Gilenya and tolerating it well. She states that the burning in her feet is controlled with gabapentin 400 mg TID. Denies any new weakness or numbness. No changes in her gait or balance. No changes in her bowels or bladder.  Denies changes with her vision. Her last MRI of the brain was in January 2015 and showed two new plaques.   HISTORY 06/10/14: 28 year old female with a history of multiple sclerosis. She returns today for follow-up. She is currently taking Gilenya and is tolerating it well. She returns today for burning sensation that is affecting the left leg and numbness in her feet that started a couple of days ago. She states that it started in her foot and moved to her leg but today it is only in her foot. She states that the burning sensation is intermittent but notices it most when she is sitting down. She has had some numbness and tingling in her feet and fingertips in the past. She is currently taking gabapentin which controlled her initial symptoms. Denies trouble with gait and balance. No changes in her vision. Denies any weakness however she does have some weakness in the right knee that was present on diagnosis. Denies any trouble swallowing. She did go to the ED for these symptoms. There they prescribed prednisone 60 mg daily. She has not started taking this yet.  REVIEW OF SYSTEMS: Out of a complete 14 system review of symptoms, the patient complains only of the following symptoms, and all other reviewed systems are negative.  ALLERGIES: No Known Allergies  HOME MEDICATIONS: Outpatient Prescriptions Prior to Visit  Medication Sig Dispense Refill  . gabapentin (NEURONTIN) 100 MG capsule Take 1 capsule (100 mg total) by mouth 3  (three) times daily. 90 capsule 3  . gabapentin (NEURONTIN) 300 MG capsule Take 1 capsule (300 mg total) by mouth 3 (three) times daily. 90 capsule 6  . GILENYA 0.5 MG CAPS TAKE 1 CAPSULE (0.5MG  TOTAL) BY MOUTH DAILY 30 capsule 6  . predniSONE (DELTASONE) 10 MG tablet Take 6 tablets (60 mg total) by mouth daily. (Patient not taking: Reported on 09/12/2014) 30 tablet 0   No facility-administered medications prior to visit.    PAST MEDICAL HISTORY: Past Medical History  Diagnosis Date  . MS (multiple sclerosis)   . Multiple sclerosis 01/12/2013  . Low back pain 01/20/2013    PAST SURGICAL HISTORY: Past Surgical History  Procedure Laterality Date  . None      FAMILY HISTORY: History reviewed. No pertinent family history.  SOCIAL HISTORY: History   Social History  . Marital Status: Married    Spouse Name: N/A    Number of Children: 0  . Years of Education: 12   Occupational History  . SALES Tree surgeon   Social History Main Topics  . Smoking status: Never Smoker   . Smokeless tobacco: Never Used  . Alcohol Use: No  . Drug Use: No  . Sexual Activity: Not on file   Other Topics Concern  . 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   Patient is right handed.   Patient has a high school education and is in  college.   Patient does not drink caffeine.      PHYSICAL EXAM  Filed Vitals:   09/12/14 1530  BP: 121/89  Pulse: 84  Temp: 97 F (36.1 C)  TempSrc: Oral  Height: 5\' 11"  (1.803 m)  Weight: 229 lb (103.874 kg)   Body mass index is 31.95 kg/(m^2).  Generalized: Well developed, in no acute distress   Neurological examination  Mentation: Alert oriented to time, place, history taking. Follows all commands speech and language fluent Cranial nerve II-XII: Pupils were equal round reactive to light. Extraocular movements were full, visual field were full on confrontational test.  Facial sensation and strength were normal. Uvula tongue midline. Head turning and shoulder shrug  were normal and symmetric. Motor: The motor testing reveals 5 over 5 strength of all 4 extremities. Good symmetric motor tone is noted throughout.  Sensory: Sensory testing is intact to soft touch on all 4 extremities. No evidence of extinction is noted.  Coordination: Cerebellar testing reveals good finger-nose-finger and heel-to-shin bilaterally.  Gait and station: Gait is normal. Tandem gait is normal. Romberg is negative. No drift is seen.  Reflexes: Deep tendon reflexes are symmetric and normal bilaterally.    DIAGNOSTIC DATA (LABS, IMAGING, TESTING) - I reviewed patient records, labs, notes, testing and imaging myself where available.     ASSESSMENT AND PLAN 28 y.o. year old female  has a past medical history of MS (multiple sclerosis); Multiple sclerosis (01/12/2013); and Low back pain (01/20/2013). here with:  1. Multiple sclerosis  Overall the patient is doing well on Gilenya. She will continue Gilenya. The patient will continue gabapentin- I will refill today. I will check blood work today. The patient inquired about weight loss tips- I included a print out about calorie counting for her. If the patients symptoms worsen or she develops new symptoms she should let us know. She will follow up in 6 months or sooner if needed.  Carol PennyMegan Evian Derringer, MSN, NP-C 09/12/2014, 3:39 PM [facility] [address], [address] [place], [place] [address] [phone]  Note: This document was prepared with digital dictation and possible smart phrase technology. Any transcriptional errors that result from this process are unintentional.

## 2014-09-13 LAB — CBC WITH DIFFERENTIAL
BASOS ABS: 0 10*3/uL (ref 0.0–0.2)
Basos: 0 %
EOS ABS: 0.1 10*3/uL (ref 0.0–0.4)
Eos: 1 %
HEMATOCRIT: 35.3 % (ref 34.0–46.6)
Hemoglobin: 12.6 g/dL (ref 11.1–15.9)
Immature Grans (Abs): 0 10*3/uL (ref 0.0–0.1)
Immature Granulocytes: 0 %
LYMPHS ABS: 0.8 10*3/uL (ref 0.7–3.1)
LYMPHS: 8 %
MCH: 25.3 pg — ABNORMAL LOW (ref 26.6–33.0)
MCHC: 35.7 g/dL (ref 31.5–35.7)
MCV: 71 fL — ABNORMAL LOW (ref 79–97)
Monocytes Absolute: 0.9 10*3/uL (ref 0.1–0.9)
Monocytes: 10 %
NEUTROS ABS: 7.6 10*3/uL — AB (ref 1.4–7.0)
Neutrophils Relative %: 81 %
Platelets: 222 10*3/uL (ref 150–379)
RBC: 4.98 x10E6/uL (ref 3.77–5.28)
RDW: 15.3 % (ref 12.3–15.4)
WBC: 9.4 10*3/uL (ref 3.4–10.8)

## 2014-09-13 LAB — COMPREHENSIVE METABOLIC PANEL
A/G RATIO: 1.4 (ref 1.1–2.5)
ALK PHOS: 129 IU/L — AB (ref 39–117)
ALT: 24 IU/L (ref 0–32)
AST: 17 IU/L (ref 0–40)
Albumin: 4.3 g/dL (ref 3.5–5.5)
BILIRUBIN TOTAL: 0.4 mg/dL (ref 0.0–1.2)
BUN / CREAT RATIO: 13 (ref 8–20)
BUN: 9 mg/dL (ref 6–20)
CO2: 27 mmol/L (ref 18–29)
Calcium: 9.6 mg/dL (ref 8.7–10.2)
Chloride: 100 mmol/L (ref 97–108)
Creatinine, Ser: 0.71 mg/dL (ref 0.57–1.00)
GFR, EST AFRICAN AMERICAN: 134 mL/min/{1.73_m2} (ref >59)
GFR, EST NON AFRICAN AMERICAN: 116 mL/min/{1.73_m2} (ref >59)
GLUCOSE: 97 mg/dL (ref 65–99)
Globulin, Total: 3.1 g/dL (ref 1.5–4.5)
POTASSIUM: 3.5 mmol/L (ref 3.5–5.2)
SODIUM: 142 mmol/L (ref 134–144)
TOTAL PROTEIN: 7.4 g/dL (ref 6.0–8.5)

## 2014-09-13 NOTE — Progress Notes (Signed)
Called Patient, LM of good lab results with only slightly high alk phos that will just be monitored.

## 2014-11-01 ENCOUNTER — Other Ambulatory Visit: Payer: Self-pay

## 2014-11-01 MED ORDER — GABAPENTIN 300 MG PO CAPS
300.0000 mg | ORAL_CAPSULE | Freq: Three times a day (TID) | ORAL | Status: DC
Start: 2014-11-01 — End: 2015-06-13

## 2014-11-15 ENCOUNTER — Telehealth: Payer: Self-pay | Admitting: Adult Health

## 2014-11-15 MED ORDER — GABAPENTIN 100 MG PO CAPS
100.0000 mg | ORAL_CAPSULE | Freq: Three times a day (TID) | ORAL | Status: DC
Start: 2014-11-15 — End: 2015-06-13

## 2014-11-15 NOTE — Telephone Encounter (Signed)
Pt is calling stating she needs prior auth and Rx  sent to [facility] [address] for [facility] 0.5 MG CAPS. She also needs a refill on gabapentin (NEURONTIN) 100 MG capsule. Please call and advise.

## 2014-11-15 NOTE — Telephone Encounter (Signed)
Gabapentin was already sent at last OV in Nov, however, I have resent the Rx.  Gilenya is a specialty med, and typically can only be filled at a Specialty Pharmacy.  As well, we need to verify current ins info to proceed with prior auth.  I called back to clarify info.  Got no answer.  Left message.

## 2014-11-17 NOTE — Telephone Encounter (Signed)
I have not heard back from the patient.  I contacted the ins we have on file, and they state the patients plan ended 10/20/2014.  I called the pharmacy, the info they have on file is inactive as well.  They also verified they are not able to fill Gilenya, as they cannot order specialty meds.  I called the patient again, got no answer.  Left message.  Asked that she please call us back with new ins info so we may proceed with her request.

## 2014-11-30 ENCOUNTER — Telehealth: Payer: Self-pay | Admitting: *Deleted

## 2014-11-30 ENCOUNTER — Telehealth: Payer: Self-pay | Admitting: Neurology

## 2014-11-30 MED ORDER — FINGOLIMOD HCL 0.5 MG PO CAPS: ORAL_CAPSULE | ORAL | Status: DC

## 2014-11-30 NOTE — Telephone Encounter (Signed)
Patient is calling because she needs prior authorization for Gilenya and when authorization is received needs Rx sent to [facility]. Thank you.

## 2014-11-30 NOTE — Telephone Encounter (Signed)
Patient calling needing Gilenya and Gabapentin filled. She stated that she called a while ago about the gabapentin but her pharmacy is still telling her they do not have it.

## 2014-11-30 NOTE — Telephone Encounter (Signed)
Ins has been contacted, and Rx has been sent. PA request currently under review.

## 2014-11-30 NOTE — Telephone Encounter (Signed)
I called [facility] who said they do have the Gabapentin Rx, and the patient picked one up last night, and the other will be ready today.  They cannot fill Gilenya, as it is a specialty med. I called the patient back.  Got no answer.  Left message.

## 2014-12-09 ENCOUNTER — Telehealth: Payer: Self-pay | Admitting: *Deleted

## 2014-12-09 NOTE — Telephone Encounter (Signed)
Carol [name] [phone]: Examination revealed normal eye anatomy without any clinical evidence of Gilenya toxicity.

## 2015-01-09 ENCOUNTER — Telehealth: Payer: Self-pay | Admitting: Adult Health

## 2015-01-09 NOTE — Telephone Encounter (Signed)
I called back.  Got no answer.  Left message.  Advised we can leave sample pack at front desk, and as well provided number for Gilenya directly [phone].  Should they need additional assistance with this med, the program may be able to help.

## 2015-01-09 NOTE — Telephone Encounter (Signed)
Pt is calling stating she is having trouble with her insurance and DOB and she has not got her Fingolimod HCl (GILENYA) 0.5 MG CAPS. She wants to know if we have any samples.  Please call her husband and advise at [phone].  She states she is at work and if we have samples he is able to come and pick it up for her.  Please call.

## 2015-04-13 ENCOUNTER — Ambulatory Visit: Payer: No Typology Code available for payment source | Admitting: Adult Health

## 2015-06-12 ENCOUNTER — Telehealth: Payer: Self-pay | Admitting: *Deleted

## 2015-06-12 ENCOUNTER — Telehealth: Payer: Self-pay | Admitting: Neurology

## 2015-06-12 ENCOUNTER — Ambulatory Visit: Payer: No Typology Code available for payment source | Admitting: Adult Health

## 2015-06-12 NOTE — Telephone Encounter (Signed)
Patient is requesting a Rx for Gilenya to try and get patient assist.  She was last seen in Nov 2015, no showed last appt.  Says she no longer has ins.  Okay to proceed with Rx, or do we need an appt scheduled first?  I appears Marcelino Duster has been trying to reach her regarding appt.  Please advise.  Thank you.

## 2015-06-12 NOTE — Telephone Encounter (Signed)
Patient called again

## 2015-06-12 NOTE — Telephone Encounter (Signed)
She has no insurance coverage and needs help with patient assistance for Gilenya.

## 2015-06-12 NOTE — Telephone Encounter (Signed)
Patient called requesting help with medication, she no longer has insurance and she is having an MS relapse. She also wonders if she could maybe see Dr Terrace Arabia (pt no show 6/23 3pm). She is wondering if there is a medication assistance program that would help with medication?

## 2015-06-12 NOTE — Telephone Encounter (Signed)
I called the patient back.  Got no answer.  Left message with number for Gilenya Go, [phone], and [employer], [phone].

## 2015-06-12 NOTE — Telephone Encounter (Signed)
Called and left patient a message stating she could come in into see Megan on Wednesday.

## 2015-06-12 NOTE — Telephone Encounter (Signed)
I called the patient to clarify if she is out of medication, and if so, when was her last dose.  Got no answer.  Left message.

## 2015-06-12 NOTE — Telephone Encounter (Signed)
I attempted to return her call again.  I have sent Lucille Passy a message to help with patient assistance for Gilenya.  I have left the patient a message letting her know that she can be worked into [employer] schedule.  I also let her know on the message that she could speak with Angie concerning a discounted cash price office visit and payment plan.

## 2015-06-12 NOTE — Telephone Encounter (Signed)
I would like to know if she has been off the medication for an extended period of time? Although I also do not want her to go without this medication if she has not been without it for an extended period of time. Carol [name], can you try to get in touch with her. If you can't reach her we will refill for 1 month and we will keep trying to get her an appointment.

## 2015-06-12 NOTE — Telephone Encounter (Signed)
Pt needs refill on Fingolimod HCl (GILENYA) 0.5 MG CAPS. May send to [phone] fax, Assistance program. Pt is going thru a relapse right now. May call pt at [phone]

## 2015-06-13 ENCOUNTER — Ambulatory Visit (INDEPENDENT_AMBULATORY_CARE_PROVIDER_SITE_OTHER): Payer: No Typology Code available for payment source | Admitting: Adult Health

## 2015-06-13 ENCOUNTER — Encounter: Payer: Self-pay | Admitting: Adult Health

## 2015-06-13 ENCOUNTER — Other Ambulatory Visit: Payer: Self-pay

## 2015-06-13 VITALS — BP 114/87 | HR 69 | Ht 71.0 in | Wt 217.0 lb

## 2015-06-13 DIAGNOSIS — G35 Multiple sclerosis: Secondary | ICD-10-CM

## 2015-06-13 DIAGNOSIS — Z5181 Encounter for therapeutic drug level monitoring: Secondary | ICD-10-CM

## 2015-06-13 MED ORDER — FINGOLIMOD HCL 0.5 MG PO CAPS: ORAL_CAPSULE | ORAL | Status: DC

## 2015-06-13 MED ORDER — PREDNISONE 10 MG PO TABS: ORAL_TABLET | ORAL | Status: DC

## 2015-06-13 NOTE — Telephone Encounter (Signed)
Called patient and left her another message asking her to call the office back. Aundra Millet can see patient this week. If patient call's please schedule patient with [facility].

## 2015-06-13 NOTE — Patient Instructions (Signed)
Check blood work today.  Prednisone dosepak. MRI brain and cervical spine If your symptoms worsen or you develop new symptoms please let us know.

## 2015-06-13 NOTE — Telephone Encounter (Signed)
Patient called in and an appointment was made for 8/23 to see [facility].

## 2015-06-13 NOTE — Progress Notes (Addendum)
PATIENT: Carol [name] DOB: 12/25/1985  REASON FOR VISIT: follow up-multiple sclerosis HISTORY FROM: patient  HISTORY OF PRESENT ILLNESS: Miss Sampley is a 29 year old female with a history of multiple sclerosis. She returns today for an evaluation. The patient states that she has not been taking Gilenya since May. She states that she no longer has insurance and therefore she could not afford the medication. She states that last week she began having a tightening sensation on the right side of the body. She states that by the weekend her gait had been affected. She denies having any falls. Denies any new numbness or weakness. Denies any changes with her vision. Denies any changes with the bowels or bladder. She returns today for an evaluation.  HISTORY 09/12/14: Ms. Thomassen is a 29 year old female with a history of multiple sclerosis. She returns today for follow-up. She is currently taking Gilenya and tolerating it well. She states that the burning in her feet is controlled with gabapentin 400 mg TID. Denies any new weakness or numbness. No changes in her gait or balance. No changes in her bowels or bladder. Denies changes with her vision. Her last MRI of the brain was in January 2015 and showed two new plaques.   HISTORY 06/10/14: 29 year old female with a history of multiple sclerosis. She returns today for follow-up. She is currently taking Gilenya and is tolerating it well. She returns today for burning sensation that is affecting the left leg and numbness in her feet that started a couple of days ago. She states that it started in her foot and moved to her leg but today it is only in her foot. She states that the burning sensation is intermittent but notices it most when she is sitting down. She has had some numbness and tingling in her feet and fingertips in the past. She is currently taking gabapentin which controlled her initial symptoms. Denies trouble with gait and balance. No changes in her  vision. Denies any weakness however she does have some weakness in the right knee that was present on diagnosis. Denies any trouble swallowing. She did go to the ED for these symptoms. There they prescribed prednisone 60 mg daily. She has not started taking this yet.  REVIEW OF SYSTEMS: Out of a complete 14 system review of symptoms, the patient complains only of the following symptoms, and all other reviewed systems are negative.  See history of present illness  ALLERGIES: No Known Allergies  HOME MEDICATIONS: Outpatient Prescriptions Prior to Visit  Medication Sig Dispense Refill  . Fingolimod HCl (GILENYA) 0.5 MG CAPS TAKE 1 CAPSULE (0.5MG  TOTAL) BY MOUTH DAILY 30 capsule 6  . gabapentin (NEURONTIN) 100 MG capsule Take 1 capsule (100 mg total) by mouth 3 (three) times daily. 90 capsule 3  . gabapentin (NEURONTIN) 300 MG capsule Take 1 capsule (300 mg total) by mouth 3 (three) times daily. 90 capsule 3  . predniSONE (DELTASONE) 10 MG tablet Take 6 tablets (60 mg total) by mouth daily. (Patient not taking: Reported on 06/13/2015) 30 tablet 0   No facility-administered medications prior to visit.    PAST MEDICAL HISTORY: Past Medical History  Diagnosis Date  . MS (multiple sclerosis)   . Multiple sclerosis 01/12/2013  . Low back pain 01/20/2013    PAST SURGICAL HISTORY: Past Surgical History  Procedure Laterality Date  . None      FAMILY HISTORY: History reviewed. No pertinent family history.  SOCIAL HISTORY: Social History  Social History  . Marital Status: Married    Spouse Name: N/A  . Number of Children: 0  . Years of Education: 12   Occupational History  .      [employer]   Social History Main Topics  . Smoking status: Never Smoker   . Smokeless tobacco: Never Used  . Alcohol Use: No  . Drug Use: No  . Sexual Activity: Not on file   Other Topics Concern  . Not on file   Social History Narrative   Patient is working at [employer]  time., lives with her husband, no children.   Patient is right handed.   Patient has a high school education and some college.   Patient does not drink caffeine.      PHYSICAL EXAM  Filed Vitals:   06/13/15 1314  BP: 114/87  Pulse: 69  Height:  (1.803 m)  Weight: 217 lb (98.431 kg)   Body mass index is 30.28 kg/(m^2).  Generalized: Well developed, in no acute distress   Neurological examination  Mentation: Alert oriented to time, place, history taking. Follows all commands speech and language fluent Cranial nerve II-XII: Pupils were equal round reactive to light. Extraocular movements were full, visual field were full on confrontational test. Facial sensation and strength were normal. Uvula tongue midline. Head turning and shoulder shrug  were normal and symmetric. Motor: The motor testing reveals 5 over 5 strength in the upper extremities. 5 out of 5 strength in the left lower extremity. The patient does have proximal weakness in the right lower extremity.Peri Jefferson symmetric motor tone is noted throughout.  Sensory: Sensory testing is intact to soft touch on all 4 extremities. No evidence of extinction is noted.  Coordination: Cerebellar testing reveals good finger-nose-finger and good heel-to-shin on the left but some mild difficulty on the right due to weakness.  Gait and station: Patient has a slight limp on the right when ambulating. Tandem gait is slightly unsteady. Romberg is negative.  Reflexes: Deep tendon reflexes are symmetric and normal bilaterally.   DIAGNOSTIC DATA (LABS, IMAGING, TESTING) - I reviewed patient records, labs, notes, testing and imaging myself where available.  Lab Results  Component Value Date   WBC 9.4 09/12/2014   HGB 12.6 09/12/2014   HCT 35.3 09/12/2014   MCV 71* 09/12/2014   PLT 222 09/12/2014      Component Value Date/Time   NA 142 09/12/2014 1611   K 3.5 09/12/2014 1611   CL 100 09/12/2014 1611   CO2 27 09/12/2014 1611   GLUCOSE  97 09/12/2014 1611   BUN 9 09/12/2014 1611   CREATININE 0.71 09/12/2014 1611   CALCIUM 9.6 09/12/2014 1611   PROT 7.4 09/12/2014 1611   AST 17 09/12/2014 1611   ALT 24 09/12/2014 1611   ALKPHOS 129* 09/12/2014 1611   BILITOT 0.4 09/12/2014 1611   GFRNONAA 116 09/12/2014 1611   GFRAA 134 09/12/2014 1611      ASSESSMENT AND PLAN 29 y.o. year old female  has a past medical history of MS (multiple sclerosis); Multiple sclerosis (01/12/2013); and Low back pain (01/20/2013). here with:  1. Multiple sclerosis 2. Proximal weakness in the right extremity- MS relapse?  I consulted with Dr. Terrace Arabia. At this time the patient will have to complete full workup for Gilenya since she has been off medication for 3 months. I will order lab work today. MRI of the brain and cervical spine will also be ordered to look for  progression of MS. However the patient does not currently have insurance. She is concerned that financially she will not be able to afford these exams. I have recommended that she visit the national MS Society website as they sometimes offer financial resources for these tests. Patient verbalized understanding. For that reason the patient will most likely have to restart Gilenya at this time. If she has any additional relapse events this medication will most likely need to be changed. When she is able to complete the MRI scans if it does indicate there is progression her medication may to be reconsidered. Patient verbalized understanding. She will follow-up in 2-3 months with Dr. Terrace Arabia.     Butch Penny, MSN, NP-C 06/13/2015, 1:20 PM [facility] [address], [address] [place], [place] [address] [phone]  Addendum I have discussed treatment plan with Aundra Millet, agree above, next follow-up visit will review MRI of the brain, and cervical spine, discussed treatment plan,

## 2015-06-13 NOTE — Telephone Encounter (Signed)
Written Rx required for Pt Assist Program

## 2015-06-14 ENCOUNTER — Telehealth: Payer: Self-pay | Admitting: Adult Health

## 2015-06-14 LAB — CBC WITH DIFFERENTIAL/PLATELET
BASOS ABS: 0 10*3/uL (ref 0.0–0.2)
BASOS: 1 %
EOS (ABSOLUTE): 0.1 10*3/uL (ref 0.0–0.4)
Eos: 1 %
Hematocrit: 35.4 % (ref 34.0–46.6)
Hemoglobin: 11.3 g/dL (ref 11.1–15.9)
Immature Grans (Abs): 0 10*3/uL (ref 0.0–0.1)
Immature Granulocytes: 0 %
LYMPHS ABS: 1.5 10*3/uL (ref 0.7–3.1)
Lymphs: 18 %
MCH: 23 pg — AB (ref 26.6–33.0)
MCHC: 31.9 g/dL (ref 31.5–35.7)
MCV: 72 fL — AB (ref 79–97)
MONOS ABS: 0.7 10*3/uL (ref 0.1–0.9)
Monocytes: 8 %
NEUTROS ABS: 6.1 10*3/uL (ref 1.4–7.0)
Neutrophils: 72 %
PLATELETS: 194 10*3/uL (ref 150–379)
RBC: 4.92 x10E6/uL (ref 3.77–5.28)
RDW: 17.2 % — ABNORMAL HIGH (ref 12.3–15.4)
WBC: 8.4 10*3/uL (ref 3.4–10.8)

## 2015-06-14 LAB — COMPREHENSIVE METABOLIC PANEL
A/G RATIO: 1.2 (ref 1.1–2.5)
ALBUMIN: 4 g/dL (ref 3.5–5.5)
ALK PHOS: 77 IU/L (ref 39–117)
ALT: 9 IU/L (ref 0–32)
AST: 13 IU/L (ref 0–40)
BILIRUBIN TOTAL: 0.4 mg/dL (ref 0.0–1.2)
BUN / CREAT RATIO: 10 (ref 8–20)
BUN: 6 mg/dL (ref 6–20)
CHLORIDE: 100 mmol/L (ref 97–108)
CO2: 25 mmol/L (ref 18–29)
Calcium: 9.5 mg/dL (ref 8.7–10.2)
Creatinine, Ser: 0.61 mg/dL (ref 0.57–1.00)
GFR calc Af Amer: 142 mL/min/{1.73_m2} (ref >59)
GFR calc non Af Amer: 123 mL/min/{1.73_m2} (ref >59)
GLOBULIN, TOTAL: 3.3 g/dL (ref 1.5–4.5)
GLUCOSE: 85 mg/dL (ref 65–99)
POTASSIUM: 4.3 mmol/L (ref 3.5–5.2)
SODIUM: 141 mmol/L (ref 134–144)
Total Protein: 7.3 g/dL (ref 6.0–8.5)

## 2015-06-14 LAB — TSH: TSH: 0.719 u[IU]/mL (ref 0.450–4.500)

## 2015-06-14 LAB — VARICELLA ZOSTER ANTIBODY, IGG: Varicella zoster IgG: 1018 index (ref >165)

## 2015-06-14 NOTE — Telephone Encounter (Signed)
Rx has been faxed to [facility]

## 2015-06-14 NOTE — Telephone Encounter (Signed)
Since she has been off Gilenya for 3 months. She will need to do 1st dose observation again. I have the prescription for gilenya that you sent. Do you want me to go ahead and sign that or wait since she has to do 1st dose observation?

## 2015-06-19 ENCOUNTER — Encounter: Payer: Self-pay | Admitting: *Deleted

## 2015-06-19 ENCOUNTER — Telehealth: Payer: Self-pay | Admitting: *Deleted

## 2015-06-19 NOTE — Telephone Encounter (Signed)
JCV results (sample collected 06/13/15):  3.46 H

## 2015-06-20 ENCOUNTER — Telehealth: Payer: Self-pay

## 2015-06-20 ENCOUNTER — Telehealth: Payer: Self-pay | Admitting: *Deleted

## 2015-06-20 NOTE — Telephone Encounter (Signed)
Marcelino Duster RN is handling patient's labs.

## 2015-06-20 NOTE — Telephone Encounter (Signed)
-----   Message from Butch Penny, NP sent at 06/20/2015 10:45 AM EDT ----- Lab work is ok. Still waiting on JCV antivirus. Labwork completed in preparation to restart Gilenya. Please call patient.

## 2015-06-20 NOTE — Telephone Encounter (Signed)
Called patient to let her know labs were ok with the exception of JCV being positive at 3.46.  She received the paperwork for MRI patient assistance today and will complete them quickly so the scans can be scheduled.  She has been off Gilenya for more than three months.  She will call us as soon as her MRIs are scheduled.

## 2015-06-21 NOTE — Telephone Encounter (Signed)
See other encounter.

## 2015-07-04 ENCOUNTER — Telehealth: Payer: Self-pay | Admitting: *Deleted

## 2015-07-04 NOTE — Telephone Encounter (Signed)
Form,Carol [name] received from [place] sent to [facility]. And Megan 07/03/15.

## 2015-07-04 NOTE — Telephone Encounter (Signed)
Faxed Form back to [employer]. Called patient relayed a copy will be at front desk for her to pick up.  [employer]  Fax # [phone].

## 2015-07-13 ENCOUNTER — Telehealth: Payer: Self-pay | Admitting: Neurology

## 2015-07-13 MED ORDER — METHYLPREDNISOLONE 4 MG PO TBPK: ORAL_TABLET | ORAL | Status: DC

## 2015-07-13 NOTE — Telephone Encounter (Signed)
Called patient to let her know Dr. Terrace Arabia has called steroids into [facility] on [address].  Told her to call us back if she develops any signs of infection.  Her MRIs are still pending.

## 2015-07-13 NOTE — Telephone Encounter (Addendum)
Patient called stating she feels like going into a relapse, legs heavy,can't spread toes, little numbness and tingling in hands. Inquiring if she can make appt for steroid inj. Please call and advise. Patient can be reached at [phone]. Can leave detailed msg on VM.

## 2015-07-13 NOTE — Telephone Encounter (Signed)
See other note

## 2015-07-13 NOTE — Telephone Encounter (Signed)
Review of the chart, patient has been off Gilenya since May 2016, now complains of bilateral lower extremity paresthesia, difficulty moving her toes, suggestive of relapsing episode of her MS,  MRI of the brain and cervical spine is pending,  [name], please check make sure she does not have any signs of active infection such as coughing, fever, urinary tract infection  If not, I have called in Medral pack to her pharmacy.

## 2015-07-17 ENCOUNTER — Telehealth: Payer: Self-pay | Admitting: Neurology

## 2015-07-17 NOTE — Telephone Encounter (Signed)
Pt called and says that she is waiting on the MS Association. She wants to make sure that the paper work here was faxed as well. Please call and advise [phone]

## 2015-07-21 ENCOUNTER — Telehealth: Payer: Self-pay | Admitting: Neurology

## 2015-07-21 NOTE — Telephone Encounter (Signed)
I have spoken with Carol [name] this morning.  Per her request, I have faxed mri brain and c-spine orders to the MS [employer], attn: Kendal Hymen, fax # [phone].  Phone # [phone].  Fax confirmation received/fim

## 2015-07-21 NOTE — Telephone Encounter (Signed)
Pt called and needs an order for MRI sent to MS association. She does not have the fax number. Pt says she needs it as soon as possible. May call [phone]

## 2015-07-24 ENCOUNTER — Telehealth: Payer: Self-pay | Admitting: Neurology

## 2015-07-24 NOTE — Telephone Encounter (Signed)
I have attempted the call twice today - mailbox is full and I am unable to leave a message.  She has a pending appointment on 07/27/15 and we will be glad to provide her with a note stating she was here at that appt (according to the message below, she does not need this before 08/02/15).

## 2015-07-24 NOTE — Telephone Encounter (Signed)
Pt called and wants a letter stating when her next appt is with Dr. Terrace Arabia. She is filing FMLA paper work and needs it as soon as possible. Letter must be turned in by pt by Oct. 12th. Please call and advise [phone]

## 2015-07-27 ENCOUNTER — Telehealth: Payer: Self-pay | Admitting: *Deleted

## 2015-07-27 ENCOUNTER — Ambulatory Visit (INDEPENDENT_AMBULATORY_CARE_PROVIDER_SITE_OTHER): Payer: Self-pay | Admitting: Neurology

## 2015-07-27 ENCOUNTER — Encounter: Payer: Self-pay | Admitting: Neurology

## 2015-07-27 VITALS — BP 116/82 | HR 78 | Ht 71.0 in | Wt 212.0 lb

## 2015-07-27 DIAGNOSIS — R202 Paresthesia of skin: Secondary | ICD-10-CM

## 2015-07-27 DIAGNOSIS — G35 Multiple sclerosis: Secondary | ICD-10-CM

## 2015-07-27 DIAGNOSIS — R200 Anesthesia of skin: Secondary | ICD-10-CM

## 2015-07-27 DIAGNOSIS — H469 Unspecified optic neuritis: Secondary | ICD-10-CM

## 2015-07-27 NOTE — Progress Notes (Signed)
Chief Complaint  Patient presents with  . Multiple Sclerosis    She is still having intermittent numbness in her toes and right hand/fingers.  She picked up the Medrol dose pack from the pharmacy but has not started it yet.  Her MRIs are still pending through patient assistance with the MS Society.      PATIENT: Carol [name] DOB: 06-13-1986  HISTORY OF PRESENT ILLNESS Carol [name] is a 29 year old female with a history of relapsing remitting multiple sclerosis.   In March 2013, she developed new-onset right eye pain and blurred vision. She was initially diagnosed with corneal abrasion. She went to the emergency room twice for this. She then followed up with ophthalmology, who ordered MRI of the brain and orbits. This showed acute right optic neuritis and 7-8 chronic to moderate plaques. She was diagnosed with probable multiple sclerosis   Around beginning of December 2013, patient developed new symptoms of numbness in her bilateral fingertips and bilateral toes, lasted for 2 weeks.  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 Patient also has intermittent muscle spasms and balance difficulty. No family history of MS.  MRI brain showed approximately six lesions in the brain which are consistent with multiple sclerosis. there is abnormality in the right optic nerve consistent with multiple sclerosis, see below report. No enhancing lesions are identified.  MRI ORBITS: Mild swelling of hyperintensity in the right optic nerve is present. This shows abnormal enhancement with slight stranding in the surrounding orbital fat suggesting acute inflammation. Enhancement is most prominent within the optic canal on the right. Optic chiasm is normal. The left optic nerve is normal.  She also complains of right low back pain, radiating pain to right leg, bilateral hands, and feet paresthesia,  She  was treated with Gilenya since March 2014, tolerated well, but has stopped Gilenya since March 2016 due to insurance reasons. She denies gait difficulty, moderate right side blurry vision at baseline   UPDATE Oct 6th 2016:  She was treated at the end of August 2016 with prednisone tapering starting from 60 mg, for her bilateral feet numbness, right hand and arm numbness, which did help her symptoms  Medrol pack was called in July 13 2015 for recurrent symptoms, she has not taken the second round of steroid yet, she complains of right-sided blurry vision, bilateral feet paresthesia, stiffness, right hand numbness, she denies significant gait difficulty, no bowel bladder incontinence.  She has stopped Gilenya since March 2016, MRI of the brain and cervical is pending  REVIEW OF SYSTEMS: Out of a complete 14 system review of symptoms, the patient complains only of the following symptoms, and all other reviewed systems are negative. Numbness, headaches, joint pain, neck pain  ALLERGIES: No Known Allergies  HOME MEDICATIONS: Outpatient Prescriptions Prior to Visit  Medication Sig Dispense Refill  . Fingolimod HCl (GILENYA) 0.5 MG CAPS TAKE 1 CAPSULE (0.5MG  TOTAL) BY MOUTH DAILY 90 capsule 0  . methylPREDNISolone (MEDROL) 4 MG TBPK tablet Take as instructed 21 tablet 0  . predniSONE (DELTASONE) 10 MG tablet Begin taking 6 tablets daily, taper by one tablet every other day until off the medication. 42 tablet 0   No facility-administered medications prior to visit.    PAST MEDICAL HISTORY: Past Medical History  Diagnosis Date  . MS (multiple sclerosis) (HCC)   . Multiple sclerosis (HCC) 01/12/2013  . Low back pain 01/20/2013    PAST SURGICAL  HISTORY: Past Surgical History  Procedure Laterality Date  . None      FAMILY HISTORY: No family history on file.  SOCIAL HISTORY: Social History   Social History  . Marital Status: Married    Spouse Name: N/A  . Number of Children: 0    . Years of Education: 12   Occupational History  .      [employer]   Social History Main Topics  . Smoking status: Never Smoker   . Smokeless tobacco: Never Used  . Alcohol Use: No  . Drug Use: No  . Sexual Activity: Not on file   Other Topics Concern  . Not on file   Social History Narrative   Patient is working at [employer] time., lives with her husband, no children.   Patient is right handed.   Patient has a high school education and some college.   Patient does not drink caffeine.      PHYSICAL EXAM  Filed Vitals:   07/27/15 1117  BP: 116/82  Pulse: 78  Height:  (1.803 m)  Weight: 212 lb (96.163 kg)   Body mass index is 29.58 kg/(m^2).  PHYSICAL EXAMNIATION:  Gen: NAD, conversant, well nourised, obese, well groomed                     Cardiovascular: Regular rate rhythm, no peripheral edema, warm, nontender. Eyes: Conjunctivae clear without exudates or hemorrhage Neck: Supple, no carotid bruise. Pulmonary: Clear to auscultation bilaterally   NEUROLOGICAL EXAM:  MENTAL STATUS: Speech:    Speech is normal; fluent and spontaneous with normal comprehension.  Cognition: Depressed-looking young female     Orientation to time, place and person     Normal recent and remote memory     Normal Attention span and concentration     Normal Language, naming, repeating,spontaneous speech     Fund of knowledge   CRANIAL NERVES: CN II: Visual fields are full to confrontation. Fundoscopic exam is normal with sharp discs and no vascular changes. Pupils are round equal and briskly reactive to light. CN III, IV, VI: extraocular movement are normal. No ptosis. CN V: Facial sensation is intact to pinprick in all 3 divisions bilaterally. Corneal responses are intact.  CN VII: Face is symmetric with normal eye closure and smile. CN VIII: Hearing is normal to rubbing fingers CN IX, X: Palate elevates symmetrically. Phonation is normal. CN XI: Head  turning and shoulder shrug are intact CN XII: Tongue is midline with normal movements and no atrophy.  MOTOR: There is no pronator drift of out-stretched arms. Muscle bulk and tone are normal. Muscle strength is normal.  REFLEXES: Reflexes are 2+ and symmetric at the biceps, triceps, knees, and ankles. Plantar responses are flexor.  SENSORY: Intact to light touch, pinprick, position sense, and vibration sense are intact in fingers and toes.  COORDINATION: Rapid alternating movements and fine finger movements are intact. There is no dysmetria on finger-to-nose and heel-knee-shin.    GAIT/STANCE: Posture is normal. Gait is steady with normal steps, base, arm swing, and turning. Heel and toe walking are normal. Tandem gait is normal.  Romberg is absent.    DIAGNOSTIC DATA (LABS, IMAGING, TESTING) - I reviewed patient records, labs, notes, testing and imaging myself where available.  Lab Results  Component Value Date   WBC 8.4 06/13/2015   HGB 12.6 09/12/2014   HCT 35.4 06/13/2015   MCV 71* 09/12/2014   PLT 222 09/12/2014  Component Value Date/Time   NA 141 06/13/2015 1416   K 4.3 06/13/2015 1416   CL 100 06/13/2015 1416   CO2 25 06/13/2015 1416   GLUCOSE 85 06/13/2015 1416   BUN 6 06/13/2015 1416   CREATININE 0.61 06/13/2015 1416   CALCIUM 9.5 06/13/2015 1416   PROT 7.3 06/13/2015 1416   AST 13 06/13/2015 1416   ALT 9 06/13/2015 1416   ALKPHOS 77 06/13/2015 1416   BILITOT 0.4 06/13/2015 1416   BILITOT 0.4 09/12/2014 1611   GFRNONAA 123 06/13/2015 1416   GFRAA 142 06/13/2015 1416      ASSESSMENT AND PLAN 29 y.o. year old female   Relapsing remitting Multiple sclerosis  She has been off Gilenya since March 2016  MRI of the brain, cervical is pending, she is getting help for MS Society  Return to clinic in one month  If there is no significant worsening on MRI of the brain, and cervical spine, will restart it Gilenya  Levert Feinstein, M.D. Ph.D.  [facility] [address] [place], [place] [address] Phone: [phone] Fax:      [phone]

## 2015-07-27 NOTE — Telephone Encounter (Signed)
Patient form on [employer].

## 2015-07-28 DIAGNOSIS — Z0289 Encounter for other administrative examinations: Secondary | ICD-10-CM

## 2015-07-31 NOTE — Telephone Encounter (Signed)
Spoke to [place] - she is aware that her FMLA paperwork will be completed and faxed to her employer on 08/01/15.

## 2015-07-31 NOTE — Telephone Encounter (Signed)
Patient is calling to check the status of FMLA paperwork. Needs to be in by 10/12 or needs a letter sent over that we are working on it still.

## 2015-08-01 ENCOUNTER — Telehealth: Payer: Self-pay | Admitting: *Deleted

## 2015-08-01 NOTE — Telephone Encounter (Signed)
Patient form at the front desk. 

## 2015-08-02 NOTE — Telephone Encounter (Signed)
Patient returned call. Advised FMLA paperwork is ready up front. Patient wanted to make sure that it was faxed to her employer. Please call patient to advise [phone].

## 2015-08-07 ENCOUNTER — Ambulatory Visit (INDEPENDENT_AMBULATORY_CARE_PROVIDER_SITE_OTHER): Payer: Self-pay

## 2015-08-07 ENCOUNTER — Ambulatory Visit: Payer: Self-pay

## 2015-08-07 DIAGNOSIS — G35 Multiple sclerosis: Secondary | ICD-10-CM

## 2015-08-07 DIAGNOSIS — Z0289 Encounter for other administrative examinations: Secondary | ICD-10-CM

## 2015-08-08 ENCOUNTER — Telehealth: Payer: Self-pay | Admitting: Neurology

## 2015-08-08 NOTE — Telephone Encounter (Signed)
Pt called and states that her FMLA paper work says that after 3 hrs she may return to work. Pt feels this is unrealistic and is requesting an update to the length of time for FMLA. Please call and advise [phone]

## 2015-08-08 NOTE — Telephone Encounter (Signed)
Spoke to [place] - paperwork updated and faxed to her employer.

## 2015-08-09 ENCOUNTER — Encounter: Payer: Self-pay | Admitting: *Deleted

## 2015-08-09 NOTE — Telephone Encounter (Signed)
Spoke to [place] - she is aware of MRI results and agreeable to restart Gilenya.  She will come by our office to sign a new start form and we will work on getting her FDO scheduled.

## 2015-08-09 NOTE — Telephone Encounter (Signed)
Left message for a return call

## 2015-08-09 NOTE — Telephone Encounter (Addendum)
Please call patient, repeat MRI of the brain showed new lesion, progression of her multiple sclerosis  Abnormal MRI brain (with and without) demonstrating: 1. There are 3 small DWI hyperintensities in the bilateral parietal regions. 1 of these lesions on the left side is slightly hypointense on ADC map. No abnormal enhancing lesions. May represent acute-subacute demyelinating plaques. 2. Several small periventricular, subcortical, juxtacortical and left posterior midbrain chronic demyelinating plaques.  3. Compared to MRI brain from 01/23/12, there are at least 6 new demyelinating plaques (3 are chronic, 2 are subacute, and 1 may be acute).  She is JC -virus positive, with titer of 3.7.  I would suggest her continue Gilenya, we can proceed with FOB arrangement.

## 2015-08-10 NOTE — Telephone Encounter (Signed)
She is going to need patient assistance for her EKG, FDO and medication.  The new start form has been completed, signed and faxed.  I called Gilenya today to let them know she was a restart and that patient assistance will be required.  They will be responsible to setting up her EKG and FDO (they will call patient to discuss locations).  She will also be eligible for medication assistance. I provided [name] with their phone number and encouraged her to be available for their calls to help expedite this process.  I asked her to call me with any questions.

## 2015-08-14 ENCOUNTER — Ambulatory Visit: Payer: Self-pay | Admitting: Neurology

## 2015-08-14 ENCOUNTER — Telehealth: Payer: Self-pay | Admitting: Neurology

## 2015-08-14 NOTE — Telephone Encounter (Signed)
Spoke to [place] - she is going to call her Gilenya navigator back today. She will start the Medrol dose pack.  I spoke to her about the importance of her Gilenya restart, especially because of her recent abnormal MRI findings.  I told her to call me back if she had any problems or questions.

## 2015-08-14 NOTE — Telephone Encounter (Addendum)
Pt called and states she spoke with a Timothy ( navigator) and states he needs service request form and to check : [facility] home medical facility box. Fax: [phone]. May call pt at [phone]

## 2015-08-14 NOTE — Telephone Encounter (Signed)
Her Gilenya restart is pending.  She is uninsured and working with the [employer] for financial assistance for her EKG, FDO and medication.  She started having progressed left-sided weakness on 08/11/15 that has not resolved.  Ok to take the Medrol dose pack previously prescribed?

## 2015-08-14 NOTE — Telephone Encounter (Signed)
Patient is calling. She states she has had a relapse on her left side with weakness since last Friday and started taking methylPREDNISolone (MEDROL) 4 MG TBPK tablet last Saturday which was prescribed in August by Dr. Terrace Arabia but the patient did not take then. The patient wants to know if it is ok for her to take this medication now. Please call and advise. Thank you.

## 2015-08-15 NOTE — Telephone Encounter (Signed)
Spoke to [place] (able to reach at [phone]) - start form updated, faxed and confirmed.

## 2015-08-16 ENCOUNTER — Ambulatory Visit: Payer: Self-pay | Admitting: Neurology

## 2015-08-17 NOTE — Telephone Encounter (Signed)
Error

## 2015-08-23 ENCOUNTER — Other Ambulatory Visit: Payer: Self-pay

## 2015-08-23 ENCOUNTER — Telehealth: Payer: Self-pay | Admitting: Neurology

## 2015-08-23 NOTE — Telephone Encounter (Signed)
Left message for a return call

## 2015-08-23 NOTE — Telephone Encounter (Signed)
Pt called and wanted to speak with the nurse about the Gilenya program . Please call and advise [phone]

## 2015-08-24 NOTE — Telephone Encounter (Signed)
Patient is returning a call. I relayed last message to her but she still would like a returned call. She can be reached at [phone]. Thank you.

## 2015-08-24 NOTE — Telephone Encounter (Signed)
I contacted [employer] (for Gilenya).  They indicate they have received all necessary info from [place] for the assistance program, however, they do need the patient to provide them with proof of income.  Their phone number is [phone], fax [phone] or mail [employer] [address] [address] [place], [place] [address]  Ref Patient ID# [id number] I called the patient back to relay this info.  Got no answer.  Left message.

## 2015-08-24 NOTE — Telephone Encounter (Signed)
Left message for a return call

## 2015-08-24 NOTE — Telephone Encounter (Signed)
Left Reagan a message that we received a fax from Gilenya - states she has been referred to the [employer] Patient Assistance Program.  She will need to be available by phone for them.  I told her to please call us back, if she has any further questions.  If her questions is regarding patient assistance, Shanda Bumps will be a good resource for her here.

## 2015-08-29 ENCOUNTER — Telehealth: Payer: Self-pay | Admitting: Neurology

## 2015-08-29 NOTE — Telephone Encounter (Signed)
Patient is calling and states she needs a message sent stating it is OK to schedule the EKG and First Dose Observation the same day sent to the Gilenya Go Program to fax #[phone]. Thanks!

## 2015-08-29 NOTE — Telephone Encounter (Addendum)
Ok, per Dr. Krista Blue, to schedule pre-med EKG and FDO on the same day, once patient has met all other clearance requirements.  Only proceed with FDO if EKG is normal.  She has a pending appointment with Dr. Katy Fitch at [facility] [phone]) for baseline eye exam for macular edema.  Her labs are complete.

## 2015-08-30 NOTE — Telephone Encounter (Signed)
Left message for a return call.  I have spoken with the Gilenya Go Program and they have noted her chart - ok to do EKG and FDO on same day, if EKG is normal.  The last thing they need is her eye exam clearance - appt pending 09/01/15. I need to provide her with the fax [phone] so Dr. Dione Booze can fax [place] his office note (attention: Dr. Montey Hora).  I will provide this information to her navigator.

## 2015-08-30 NOTE — Telephone Encounter (Addendum)
Pt called to say she has an eye appt Nov. 11th. Pt would like a call back . Please call and advise [phone]

## 2015-08-30 NOTE — Telephone Encounter (Signed)
Spoke to Carol [name] - she is going to call the Gil[employer]ya Go Program to schedule her FDO after her eye clearance.  She will call me back with the date of her FDO appt.  I will fax both the eye exam and FDO appt information to her employer, so she will be excused from work on these days.

## 2015-08-30 NOTE — Telephone Encounter (Signed)
Patient called requesting Carol [name] call the [facility] [phone], they never received fax and just need to clear patient for 1st dose observation and EKG at the same time. Ask for Navigator Tim or any Navigator is he is not available.

## 2015-08-31 NOTE — Telephone Encounter (Signed)
Pt returned called. She was told to have Dr. Dione Booze fax office note to this office, fax # was given. Pt expressed understanding.

## 2015-09-01 ENCOUNTER — Telehealth: Payer: Self-pay

## 2015-09-01 NOTE — Telephone Encounter (Signed)
Waiting for notes from Dr. Dione Booze to fax to the Gilenya Go Program.

## 2015-09-01 NOTE — Telephone Encounter (Signed)
[employer] has approved patient assistance on Gilenya for this patient valid until 08/27/2016 Ref Patient ID# [id number] They indicate they have notified the patient of this decision as well.

## 2015-09-04 NOTE — Telephone Encounter (Signed)
Pt called sts she has scheduled appt. Also she said dates for FMLA are: 09/01/15 (eyes) and 09/11/15(1st dose observation)

## 2015-09-04 NOTE — Telephone Encounter (Signed)
I called Dr. Laruth Bouchard office this morning and requested records.  She has been cleared for Gilenya FDO.  I have already notified her navigator at the [employer] Go [employer].  She is aware to expect a call for scheduling.

## 2015-09-04 NOTE — Telephone Encounter (Signed)
Called Dr. Laruth Bouchard office to request records.

## 2015-09-04 NOTE — Telephone Encounter (Addendum)
Pt is inquiring if [facility] has received OV (111/11/16) from Dr Laruth Bouchard office. Please call and advise [phone]

## 2015-09-04 NOTE — Telephone Encounter (Signed)
Added appt dates to [facility] paperwork and faxed to her employer.

## 2015-09-04 NOTE — Telephone Encounter (Signed)
Records received - no evidence of macular edema - Dr. Terrace Arabia reviewed - cleared for Gilenya FDO.  Called [employer] and left message for her navigator, Jorja Loa, to go ahead and schedule her.  Called patient to let her know to expect the phone call for her EKG and FDO appt.

## 2015-09-06 ENCOUNTER — Telehealth: Payer: Self-pay | Admitting: Neurology

## 2015-09-06 DIAGNOSIS — R202 Paresthesia of skin: Secondary | ICD-10-CM

## 2015-09-06 DIAGNOSIS — R200 Anesthesia of skin: Secondary | ICD-10-CM

## 2015-09-06 DIAGNOSIS — G35 Multiple sclerosis: Secondary | ICD-10-CM

## 2015-09-06 MED ORDER — GABAPENTIN 300 MG PO CAPS: 300.0000 mg | ORAL_CAPSULE | Freq: Three times a day (TID) | ORAL | Status: DC

## 2015-09-06 NOTE — Telephone Encounter (Signed)
Ok per vo by Dr. Terrace Arabia to send in rx for gabapentin , TID.  Patient aware.

## 2015-09-06 NOTE — Telephone Encounter (Signed)
Patient is calling to advise that she received a call from Gil[employer]ya and they requested that she go for pre screening today. Please add to patient's FMLA paperwork about this appointment. She will leave at 5pm today for the pre screening. Thank you.

## 2015-09-06 NOTE — Telephone Encounter (Signed)
Appt date added to FMLA papers and faxed to her employer.

## 2015-09-06 NOTE — Telephone Encounter (Signed)
Pt called sts when taking Gilenya she gets muscle spasms in arms and legs and numbness, tingling in bil hands and feet. She sts she has taken gabapentin in the past and would like an RX for it.

## 2015-09-06 NOTE — Telephone Encounter (Signed)
Pt called inquiring if dates had been added to [facility]. I relayed information.

## 2015-09-07 ENCOUNTER — Telehealth: Payer: Self-pay | Admitting: Neurology

## 2015-09-07 NOTE — Telephone Encounter (Signed)
I have spoken with Chip Boer this afternoon and advised that EKG was received from [facility] and per YY, pt. is ok for FDO/fim

## 2015-09-07 NOTE — Telephone Encounter (Signed)
Chip Boer, RN/Gilenya [phone] called to advise patient did final testing yesterday, needs to know if patient is clear to do 1st dose of Gilenya on Monday? Needs to know before Monday.

## 2015-09-11 NOTE — Telephone Encounter (Signed)
Pt called sts FMLA paperwrk needs to be faxed to [facility].

## 2015-09-11 NOTE — Telephone Encounter (Signed)
Faxed updated paperwork again.

## 2015-09-26 ENCOUNTER — Ambulatory Visit (INDEPENDENT_AMBULATORY_CARE_PROVIDER_SITE_OTHER): Payer: Self-pay | Admitting: Nurse Practitioner

## 2015-09-26 ENCOUNTER — Encounter: Payer: Self-pay | Admitting: Nurse Practitioner

## 2015-09-26 VITALS — BP 121/76 | HR 69 | Ht 71.0 in | Wt 218.6 lb

## 2015-09-26 DIAGNOSIS — R202 Paresthesia of skin: Secondary | ICD-10-CM

## 2015-09-26 DIAGNOSIS — G35 Multiple sclerosis: Secondary | ICD-10-CM

## 2015-09-26 DIAGNOSIS — R200 Anesthesia of skin: Secondary | ICD-10-CM

## 2015-09-26 DIAGNOSIS — H469 Unspecified optic neuritis: Secondary | ICD-10-CM

## 2015-09-26 NOTE — Patient Instructions (Signed)
Continue Gilenya at current dose just started 2 weeks ago Continue gabapentin for muscle spasms at current dose Follow-up in 3 months with [facility] nurse practitioner

## 2015-09-26 NOTE — Progress Notes (Signed)
[facility]  PATIENT: Carol [name] DOB: [birth year]   REASON FOR VISIT: Follow-up for multiple sclerosis, numbness and tingling HISTORY FROM: Patient    HISTORY OF PRESENT ILLNESS:Carol [name] is a 29 year old female with a history of relapsing remitting multiple sclerosis.  In March 2013, she developed new-onset right eye pain and blurred vision. She was initially diagnosed with corneal abrasion. She went to the emergency room twice for this. She then followed up with ophthalmology, who ordered MRI of the brain and orbits. This showed acute right optic neuritis and 7-8 chronic to moderate plaques. She was diagnosed with probable multiple sclerosis  Around beginning of December 2013, patient developed new symptoms of numbness in her bilateral fingertips and bilateral toes, lasted for 2 weeks.  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 Patient also has intermittent muscle spasms and balance difficulty. No family history of MS. MRI brain showed approximately six lesions in the brain which are consistent with multiple sclerosis. there isabnormality in the right optic nerve consistent with multiple sclerosis, see below report. No enhancing lesions are identified. MRI ORBITS: Mild swelling of hyperintensity in the right optic nerve is present. This shows abnormal enhancement with slight stranding in the surrounding orbital fat suggesting acute inflammation. Enhancement is most prominent within the optic canal on the right. Optic chiasm is normal. The left optic nerve is normal. She also complains of right low back pain, radiating pain to right leg, bilateral hands, and feet paresthesia, She was treated with Gilenya since March 2014, tolerated well, but has stopped Gilenya since March 2016 due to insurance reasons. She denies gait difficulty, moderate right side blurry  vision at baseline  UPDATE Oct 6th 2016:She was treated at the end of August 2016 with prednisone tapering starting from 60 mg, for her bilateral feet numbness, right hand and arm numbness, which did help her symptoms Medrol pack was called in July 13 2015 for recurrent symptoms, she has not taken the second round of steroid yet, she complains of right-sided blurry vision, bilateral feet paresthesia, stiffness, right hand numbness, she denies significant gait difficulty, no bowel bladder incontinence. She has stopped Gilenya since March 2016, MRI of the brain and cervical is pending UPDATE 09/26/2015 CMMs. Carol [name], 29 year old female returns for follow-up. She has been previously followed by Dr. Terrace Arabia and Everlene Other nurse practitioner. She was last seen October 6. She had a flare of symptoms in August and also September and received prednisone Dosepaks. She stopped her Gilenya in March 2016 because of insurance reasons and the patient was not aware she can get it through patient assistance. MRI of the brain with and without contrast on 08/07/2015 was abnormal 1. There are 3 small DWI hyperintensities in the bilateral parietal regions. 1 of these lesions on the left side is slightly hypointense on ADC map. No abnormal enhancing lesions. May represent acute-subacute demyelinating plaques. 2. Several small periventricular, subcortical, juxtacortical and left posterior midbrain chronic demyelinating plaques.  3. Compared to MRI brain from 01/23/12, there are at least 6 new demyelinating plaques (3 are chronic, 2 are subacute, and 1 may be acute). She received first dose Gilenya 2 weeks ago and has restarted the medication. The numbness in her feet and hands is better but not completely gone she has not had any falls no bowel or bladder incontinence, denies blurred vision or loss of vision. She returns for reevaluation. She continues to  work full-time  REVIEW OF SYSTEMS: Full 14 system review of systems  performed and notable only for those listed, all others are neg:  Constitutional: neg  Cardiovascular: neg Ear/Nose/Throat: neg  Skin: neg Eyes: neg Respiratory: neg Gastroitestinal: neg  Hematology/Lymphatic: neg  Endocrine: neg Musculoskeletal:neg Allergy/Immunology: neg Neurological: Numbness Psychiatric: neg Sleep : neg   ALLERGIES: No Known Allergies  HOME MEDICATIONS: Outpatient Prescriptions Prior to Visit  Medication Sig Dispense Refill  . Fingolimod HCl (GILENYA) 0.5 MG CAPS TAKE 1 CAPSULE (0.5MG  TOTAL) BY MOUTH DAILY 90 capsule 0  . gabapentin (NEURONTIN) 300 MG capsule Take 1 capsule (300 mg total) by mouth 3 (three) times daily. 90 capsule 11  . methylPREDNISolone (MEDROL) 4 MG TBPK tablet Take as instructed (Patient not taking: Reported on 09/26/2015) 21 tablet 0   No facility-administered medications prior to visit.    PAST MEDICAL HISTORY: Past Medical History  Diagnosis Date  . MS (multiple sclerosis) (HCC)   . Multiple sclerosis (HCC) 01/12/2013  . Low back pain 01/20/2013    PAST SURGICAL HISTORY: Past Surgical History  Procedure Laterality Date  . None      FAMILY HISTORY: No family history on file.  SOCIAL HISTORY: Social History   Social History  . Marital Status: Married    Spouse Name: N/A  . Number of Children: 0  . Years of Education: 12   Occupational History  .      [employer]   Social History Main Topics  . Smoking status: Never Smoker   . Smokeless tobacco: Never Used  . Alcohol Use: No  . Drug Use: No  . Sexual Activity: Not on file   Other Topics Concern  . Not on file   Social History Narrative   Patient is working at [employer] time., lives with her husband, no children.   Patient is right handed.   Patient has a high school education and some college.   Patient does not drink caffeine.     PHYSICAL EXAM  Filed Vitals:   09/26/15 1604  Height: 5\' 11"  (1.803 m)  Weight: 218 lb 9.6 oz  (99.156 kg)   Body mass index is 30.5 kg/(m^2).  Generalized: Well developed, obese female in no acute distress  Head: normocephalic and atraumatic,. Oropharynx benign  Neck: Supple, no carotid bruits  Cardiac: Regular rate rhythm, no murmur  Musculoskeletal: No deformity   Neurological examination   Mentation: Alert oriented to time, place, history taking. Attention span and concentration appropriate. Recent and remote memory intact.  Follows all commands speech and language fluent.   Cranial nerve II-XII: Fundoscopic exam reveals sharp disc margins.Pupils were equal round reactive to light extraocular movements were full, visual field were full on confrontational test. Facial sensation and strength were normal. hearing was intact to finger rubbing bilaterally. Uvula tongue midline. head turning and shoulder shrug were normal and symmetric.Tongue protrusion into cheek strength was normal. Motor: normal bulk and tone, full strength in the BUE, BLE, fine finger movements normal, no pronator drift. No focal weakness Sensory: normal and symmetric to light touch, pinprick, and  Vibration, and upper and lower extremities Coordination: finger-nose-finger, heel-to-shin bilaterally, no dysmetria Reflexes: Brachioradialis 2/2, biceps 2/2, triceps 2/2, patellar 2/2, Achilles 2/2, plantar responses were flexor bilaterally. Gait and Station: Rising up from seated position without assistance, normal stance,  moderate stride, good arm swing, smooth turning, able to perform tiptoe, and heel walking without difficulty. Tandem gait is steady  DIAGNOSTIC DATA (LABS, IMAGING,  TESTING) - I reviewed patient records, labs, notes, testing and imaging myself where available.  Lab Results  Component Value Date   WBC 8.4 06/13/2015   HGB 12.6 09/12/2014   HCT 35.4 06/13/2015   MCV 71* 09/12/2014   PLT 222 09/12/2014      Component Value Date/Time   NA 141 06/13/2015 1416   K 4.3 06/13/2015 1416   CL 100  06/13/2015 1416   CO2 25 06/13/2015 1416   GLUCOSE 85 06/13/2015 1416   BUN 6 06/13/2015 1416   CREATININE 0.61 06/13/2015 1416   CALCIUM 9.5 06/13/2015 1416   PROT 7.3 06/13/2015 1416   ALBUMIN 4.0 06/13/2015 1416   AST 13 06/13/2015 1416   ALT 9 06/13/2015 1416   ALKPHOS 77 06/13/2015 1416   BILITOT 0.4 06/13/2015 1416   BILITOT 0.4 09/12/2014 1611   GFRNONAA 123 06/13/2015 1416   GFRAA 142 06/13/2015 1416    Lab Results  Component Value Date   TSH 0.719 06/13/2015      ASSESSMENT AND PLAN  29 y.o. year old female  has a past medical history of  Multiple sclerosis (HCC) (01/12/2013);  here to follow-up. She had an exacerbation of symptoms in August and September with Medrol Dosepaks. She had first dose Gilenya 2 weeks ago. She had been off the medication since March 2016. MRI of the brain with and without contrast on 08/07/2015 was abnormal 1. There are 3 small DWI hyperintensities in the bilateral parietal regions. 1 of these lesions on the left side is slightly hypointense on ADC map. No abnormal enhancing lesions. May represent acute-subacute demyelinating plaques. 2. Several small periventricular, subcortical, juxtacortical and left posterior midbrain chronic demyelinating plaques.  3. Compared to MRI brain from 01/23/12, there are at least 6 new demyelinating plaques (3 are chronic, 2 are subacute, and 1 may be acute).  PLANContinue Gilenya at current dose just started 2 weeks ago patient assistance Continue gabapentin for muscle spasms at current dose does not need refills Reviewed MRI of the brain with the patient and reinforced the reasoning for not going off the medication. Follow-up in 3 months with [facility] nurse practitioner for reevaluation and labsVst time 25 min Nilda Riggs, [facility], [facility], APRN  [facility] [address] [place], [place] [address] [phone]

## 2015-09-27 NOTE — Progress Notes (Signed)
I have reviewed and agreed above plan. 

## 2015-12-27 ENCOUNTER — Ambulatory Visit: Payer: Self-pay | Admitting: Adult Health

## 2016-02-06 ENCOUNTER — Ambulatory Visit: Payer: Self-pay | Admitting: Adult Health

## 2016-02-07 ENCOUNTER — Encounter: Payer: Self-pay | Admitting: Adult Health

## 2016-02-19 ENCOUNTER — Emergency Department (HOSPITAL_COMMUNITY)
Admission: EM | Admit: 2016-02-19 | Discharge: 2016-02-19 | Disposition: A | Discharge status: Home / Self Care | Payer: 59 | Attending: Emergency Medicine | Admitting: Emergency Medicine

## 2016-02-19 ENCOUNTER — Encounter (HOSPITAL_COMMUNITY): Payer: Self-pay | Admitting: *Deleted

## 2016-02-19 DIAGNOSIS — M79604 Pain in right leg: Secondary | ICD-10-CM

## 2016-02-19 DIAGNOSIS — G35 Multiple sclerosis: Secondary | ICD-10-CM | POA: Diagnosis not present

## 2016-02-19 DIAGNOSIS — M79661 Pain in right lower leg: Secondary | ICD-10-CM | POA: Insufficient documentation

## 2016-02-19 DIAGNOSIS — M79605 Pain in left leg: Secondary | ICD-10-CM

## 2016-02-19 DIAGNOSIS — M79662 Pain in left lower leg: Secondary | ICD-10-CM | POA: Diagnosis not present

## 2016-02-19 DIAGNOSIS — Z79899 Other long term (current) drug therapy: Secondary | ICD-10-CM | POA: Diagnosis not present

## 2016-02-19 MED ORDER — PREDNISONE 10 MG (21) PO TBPK: 10.0000 mg | ORAL_TABLET | Freq: Every day | ORAL | Status: DC

## 2016-02-19 NOTE — ED Provider Notes (Signed)
CSN: [id number]     Arrival date & time 02/19/16  1747 History  By signing my name below, I, Emmanuella Mensah, attest that this documentation has been prepared under the direction and in the presence of Kahlin Mark, PA-C. Electronically Signed: Angelene Giovanni, ED Scribe. 02/19/2016. 6:51 PM.    Chief Complaint  Patient presents with  . Leg Pain   The history is provided by the patient. No language interpreter was used.   HPI Comments: Carol [name] is a 30 y.o. female with a hx of MS (relapsing-remitting) who presents to the Emergency Department complaining of gradually worsening burning sensation to her posterior right calf onset several days ago. She states that she is now beginning to experience a similar burning sensation to her posterior left calf as well. No alleviating factors noted. Pt has not tried any medications PTA. She denies any recent falls, injuries, or trauma. Pt thinks this may be a flare up of her MS and past flare ups have been treated effectively with a prednisone dose pack. She denies any fever/chills, Difficulty ambulating, or trauma. Patient further denies swelling, erythema, or tenderness to the area. Pt sees Dr. Terrace Arabia for her MS. patient has no other symptoms that would indicate a more dangerous flareup, such as difficulty breathing, talking, or swallowing.   Past Medical History  Diagnosis Date  . MS (multiple sclerosis) (HCC)   . Multiple sclerosis (HCC) 01/12/2013  . Low back pain 01/20/2013   Past Surgical History  Procedure Laterality Date  . None     No family history on file. Social History  Substance Use Topics  . Smoking status: Never Smoker   . Smokeless tobacco: Never Used  . Alcohol Use: No   OB History    No data available     Review of Systems  Constitutional: Negative for fever and chills.  HENT: Negative for trouble swallowing.   Gastrointestinal: Negative for nausea and vomiting.  Musculoskeletal: Positive for myalgias. Negative for joint  swelling.  Neurological: Negative for dizziness, weakness, light-headedness, numbness and headaches.  All other systems reviewed and are negative.     Allergies  Review of patient's allergies indicates no known allergies.  Home Medications   Prior to Admission medications   Medication Sig Start Date End Date Taking? Authorizing Provider  Fingolimod HCl (GILENYA) 0.5 MG CAPS TAKE 1 CAPSULE (0.5MG  TOTAL) BY MOUTH DAILY 06/13/15   Butch Penny, NP  gabapentin (NEURONTIN) 300 MG capsule Take 1 capsule (300 mg total) by mouth 3 (three) times daily. 09/06/15   Levert Feinstein, MD  predniSONE (STERAPRED UNI-PAK 21 TAB) 10 MG (21) TBPK tablet Take 1 tablet (10 mg total) by mouth daily. Take 6 tabs by mouth daily  for 2 days, then 5 tabs for 2 days, then 4 tabs for 2 days, then 3 tabs for 2 days, 2 tabs for 2 days, then 1 tab by mouth daily for 2 days 02/19/16   Vernadette Stutsman C Lamara Brecht, PA-C   BP 133/97 mmHg  Pulse 71  Temp(Src) 98 F (36.7 C) (Oral)  Resp 18  SpO2 100% Physical Exam  Constitutional: She is oriented to person, place, and time. She appears well-developed and well-nourished. No distress.  HENT:  Head: Normocephalic and atraumatic.  Mouth/Throat: Oropharynx is clear and moist.  No difficulty swallowing. Readily handles secretions without difficulty.  Eyes: Conjunctivae are normal. Pupils are equal, round, and reactive to light.  Neck: Neck supple.  Cardiovascular: Normal rate, regular rhythm and intact distal pulses.  Pulmonary/Chest: Effort normal and breath sounds normal. No respiratory distress.  Abdominal: Soft. There is no tenderness. There is no guarding.  Musculoskeletal: Normal range of motion. She exhibits no edema or tenderness.  Full range of motion bilaterally in the ankle and knee. No tenderness, erythema, or swelling noted to the patient's calves.  Lymphadenopathy:    She has no cervical adenopathy.  Neurological: She is alert and oriented to person, place, and time. She has  normal reflexes.  No sensory deficits. Strength 5/5 in all extremities. No gait disturbance. Coordination intact. Cranial nerves III-XII grossly intact. No facial droop.   Skin: Skin is warm and dry. She is not diaphoretic.  Psychiatric: She has a normal mood and affect. Her behavior is normal.  Nursing note and vitals reviewed.   ED Course  Procedures (including critical care time) DIAGNOSTIC STUDIES: Oxygen Saturation is 100% on RA, normal by my interpretation.    COORDINATION OF CARE: 6:49 PM- Pt advised of plan for treatment and pt agrees. Pt will receive prednisone pack.   MDM   Final diagnoses:  Pain of right lower extremity  Pain of left lower extremity    Carol [name] presents with burning sensation to the posterior calves for the last 3-5 days.  This patient's presentation is not suggestive of DVT. Suspect possible MS flareup or other similar neurologic issue. Full physical exam performed due to the systemic nature of MS. Patient has no deficits on exam. Prednisone prescribed. Patient advised to follow-up with her neurologist as soon as possible. Return precautions discussed. Patient voiced understanding of these instructions and is comfortable with discharge.  I personally performed the services described in this documentation, which was scribed in my presence. The recorded information has been reviewed and is accurate.   Anselm Pancoast, PA-C 02/20/16 1551  Mancel Bale, MD 02/20/16 1558

## 2016-02-19 NOTE — ED Notes (Addendum)
Pt reports rt calf pain and now left calf pain as well. Pt states that it is a burning sensation. No known injury. No swelling heat or reddness noted.

## 2016-02-19 NOTE — Discharge Instructions (Signed)
You have been seen today for leg pain. Your suspicions of a possible minor MS flare up may be correct. You are being prescribed prednisone, as was indicated for your flare ups in the past. You should follow up with your neurologist as soon as possible. Follow up with PCP as needed. Return to ED should symptoms worsen.

## 2016-02-19 NOTE — ED Notes (Signed)
Pt st's she has been having pain in left lower leg.  No known injury

## 2016-02-29 ENCOUNTER — Ambulatory Visit (INDEPENDENT_AMBULATORY_CARE_PROVIDER_SITE_OTHER): Payer: 59 | Admitting: Obstetrics and Gynecology

## 2016-02-29 ENCOUNTER — Encounter: Payer: Self-pay | Admitting: Obstetrics and Gynecology

## 2016-02-29 VITALS — BP 110/82 | HR 84 | Resp 16 | Ht 71.0 in | Wt 211.0 lb

## 2016-02-29 DIAGNOSIS — N915 Oligomenorrhea, unspecified: Secondary | ICD-10-CM

## 2016-02-29 DIAGNOSIS — Z01419 Encounter for gynecological examination (general) (routine) without abnormal findings: Secondary | ICD-10-CM | POA: Diagnosis not present

## 2016-02-29 DIAGNOSIS — N926 Irregular menstruation, unspecified: Secondary | ICD-10-CM | POA: Diagnosis not present

## 2016-02-29 DIAGNOSIS — Z8742 Personal history of other diseases of the female genital tract: Secondary | ICD-10-CM

## 2016-02-29 DIAGNOSIS — Z124 Encounter for screening for malignant neoplasm of cervix: Secondary | ICD-10-CM

## 2016-02-29 MED ORDER — MEDROXYPROGESTERONE ACETATE 5 MG PO TABS: 5.0000 mg | ORAL_TABLET | Freq: Every day | ORAL | Status: DC

## 2016-02-29 NOTE — Progress Notes (Signed)
Patient ID: Carol [name], female   DOB: 1986/02/02, 30 y.o.   MRN: [id number] 30 y.o. G0P0000 MarriedAfrican AmericanF here for annual exam.   Period Duration (Days): 3-5 days  Period Pattern: (!) Irregular Menstrual Flow: Moderate Menstrual Control: Thin pad, Tampon, Maxi pad Dysmenorrhea: None  The patient has always had irregular cycles, q 1-4 months. Over the last 5-6 months they have been monthly, in the last year she hasn't missed more than 1.5 months. Saturates a super tampon in 4+ hours. No BTB. Sexually active, no dyspareunia. No contraception for 7-8 years of unprotected intercourse. She is okay with not getting pregnant, great if she does. She isn't interested in contraception.  She was diagnosed with MS in 2013-2014, in remission, feeling fine.   Patient's last menstrual period was 01/22/2016 (approximate).          Sexually active: Yes.    The current method of family planning is none.    Exercising: No.  The patient does not participate in regular exercise at present. Smoker:  no  Health Maintenance: Pap:  2015 WNL Per Patient History of abnormal Pap:  no MMG:  N/A Colonoscopy:  N/A BMD:   N/A TDaP:  unsure Gardasil: unsure    reports that she has never smoked. She has never used smokeless tobacco. She reports that she does not drink alcohol or use illicit drugs.Homemaker.  Past Medical History  Diagnosis Date  . MS (multiple sclerosis) (HCC)   . Multiple sclerosis (HCC) 01/12/2013  . Low back pain 01/20/2013    Past Surgical History  Procedure Laterality Date  . None      Current Outpatient Prescriptions  Medication Sig Dispense Refill  . Fingolimod HCl (GILENYA) 0.5 MG CAPS TAKE 1 CAPSULE (0.5MG  TOTAL) BY MOUTH DAILY 90 capsule 0  . gabapentin (NEURONTIN) 300 MG capsule Take 1 capsule (300 mg total) by mouth 3 (three) times daily. 90 capsule 11   No current facility-administered medications for this visit.    Family History  Problem Relation Age of Onset  .  Hypertension Maternal Grandmother   . Diabetes Maternal Grandmother   . Hypertension Paternal Grandmother     Review of Systems  Constitutional: Negative.   HENT: Negative.   Eyes: Negative.   Respiratory: Negative.   Cardiovascular: Negative.   Gastrointestinal: Negative.   Endocrine: Negative.   Genitourinary: Negative.   Musculoskeletal: Negative.   Skin: Negative.   Allergic/Immunologic: Negative.   Neurological: Negative.   Psychiatric/Behavioral: Negative.     Exam:   BP 110/82 mmHg  Pulse 84  Resp 16  Ht 5\' 11"  (1.803 m)  Wt 211 lb (95.709 kg)  BMI 29.44 kg/m2  LMP 01/22/2016 (Approximate)  Weight change: @WEIGHTCHANGE @ Height:   Height: 5\' 11"  (180.3 cm)  Ht Readings from Last 3 Encounters:  02/29/16 5\' 11"  (1.803 m)  09/26/15 5\' 11"  (1.803 m)  07/27/15 5\' 11"  (1.803 m)    General appearance: alert, cooperative and appears stated age Head: Normocephalic, without obvious abnormality, atraumatic Neck: no adenopathy, supple, symmetrical, trachea midline and thyroid normal to inspection and palpation Lungs: clear to auscultation bilaterally Breasts: normal appearance, no masses or tenderness Heart: regular rate and rhythm Abdomen: soft, non-tender; bowel sounds normal; no masses,  no organomegaly Extremities: extremities normal, atraumatic, no cyanosis or edema Skin: Skin color, texture, turgor normal. No rashes or lesions Lymph nodes: Cervical, supraclavicular, and axillary nodes normal. No abnormal inguinal nodes palpated Neurologic: Grossly normal   Pelvic: External genitalia:  no lesions  Urethra:  normal appearing urethra with no masses, tenderness or lesions              Bartholins and Skenes: normal                 Vagina: normal appearing vagina with normal color and discharge, no lesions              Cervix: no lesions               Bimanual Exam:  Uterus:  normal size, contour, position, consistency, mobility, non-tender               Adnexa: no mass, fullness, tenderness               Rectovaginal: Confirms               Anus:  normal sphincter tone, no lesions  Chaperone was present for exam.  A:  Well Woman with normal exam  Oligomenorrhea, long term, better in the last year. She reports having lab work with a gyn 2 years ago, will get records  Infertility, declines w/u or treatment. Would be happy if she did get pregnant  P:   Pap with hpv  Discussed breast self exam  Discussed calcium and vit D intake  Blood work with neurologist  She will check on her tetanus status  Recommended she start a multivitamin or PNV  Discussed oligomenorrhea and the long term risks of endometrial hyperplasia or cancer. Recommended she use cyclic provera every other month if no spontaneous cycles. She understands she needs to have protected  intercourse or no intercourse for 2 weeks prior to the provera and have a negative pregnancy test prior to taking it.

## 2016-02-29 NOTE — Patient Instructions (Addendum)
If you go 2 months without a period, have protected intercourse or no intercourse x 2 weeks. Check a pregnancy test, as long as it's negative, take the provera x 5 days. This should bring on a cycle in 1-2 weeks  Start a multivitamin or prenatal vitamin daily  EXERCISE AND DIET:  We recommended that you start or continue a regular exercise program for good health. Regular exercise means any activity that makes your heart beat faster and makes you sweat.  We recommend exercising at least 30 minutes per day at least 3 days a week, preferably 4 or 5.  We also recommend a diet low in fat and sugar.  Inactivity, poor dietary choices and obesity can cause diabetes, heart attack, stroke, and kidney damage, among others.    ALCOHOL AND SMOKING:  Women should limit their alcohol intake to no more than 7 drinks/beers/glasses of wine (combined, not each!) per week. Moderation of alcohol intake to this level decreases your risk of breast cancer and liver damage. And of course, no recreational drugs are part of a healthy lifestyle.  And absolutely no smoking or even second hand smoke. Most people know smoking can cause heart and lung diseases, but did you know it also contributes to weakening of your bones? Aging of your skin?  Yellowing of your teeth and nails?  CALCIUM AND VITAMIN D:  Adequate intake of calcium and Vitamin D are recommended.  The recommendations for exact amounts of these supplements seem to change often, but generally speaking 600 mg of calcium (either carbonate or citrate) and 800 units of Vitamin D per day seems prudent. Certain women may benefit from higher intake of Vitamin D.  If you are among these women, your doctor will have told you during your visit.    PAP SMEARS:  Pap smears, to check for cervical cancer or precancers,  have traditionally been done yearly, although recent scientific advances have shown that most women can have pap smears less often.  However, every woman still should  have a physical exam from her gynecologist every year. It will include a breast check, inspection of the vulva and vagina to check for abnormal growths or skin changes, a visual exam of the cervix, and then an exam to evaluate the size and shape of the uterus and ovaries.  And after 30 years of age, a rectal exam is indicated to check for rectal cancers. We will also provide age appropriate advice regarding health maintenance, like when you should have certain vaccines, screening for sexually transmitted diseases, bone density testing, colonoscopy, mammograms, etc.   MAMMOGRAMS:  All women over 43 years old should have a yearly mammogram. Many facilities now offer a "3D" mammogram, which may cost around $50 extra out of pocket. If possible,  we recommend you accept the option to have the 3D mammogram performed.  It both reduces the number of women who will be called back for extra views which then turn out to be normal, and it is better than the routine mammogram at detecting truly abnormal areas.    COLONOSCOPY:  Colonoscopy to screen for colon cancer is recommended for all women at age 63.  We know, you hate the idea of the prep.  We agree, BUT, having colon cancer and not knowing it is worse!!  Colon cancer so often starts as a polyp that can be seen and removed at colonscopy, which can quite literally save your life!  And if your first colonoscopy is normal and you  have no family history of colon cancer, most women don't have to have it again for 10 years.  Once every ten years, you can do something that may end up saving your life, right?  We will be happy to help you get it scheduled when you are ready.  Be sure to check your insurance coverage so you understand how much it will cost.  It may be covered as a preventative service at no cost, but you should check your particular policy.

## 2016-03-04 LAB — IPS PAP TEST WITH HPV

## 2016-03-06 ENCOUNTER — Telehealth: Payer: Self-pay | Admitting: Obstetrics and Gynecology

## 2016-03-06 NOTE — Telephone Encounter (Signed)
Call to patient. She is advised of normal pap smear results. Has concerns about inflammation noted, advised still is normal result, could be cycle related or cell turnover on cervix.   She is advised she will receive a letter from the lab with normal results as well.  Patient verbalized understanding. She will follow up as needed. Routing to provider for final review. Patient agreeable to disposition. Will close encounter.

## 2016-03-06 NOTE — Telephone Encounter (Signed)
Patient wants to discuss her pap results. She said they were on [facility] and she does not understand how to read it.

## 2016-07-30 ENCOUNTER — Other Ambulatory Visit: Payer: Self-pay | Admitting: *Deleted

## 2016-07-30 ENCOUNTER — Telehealth: Payer: Self-pay | Admitting: Neurology

## 2016-07-30 MED ORDER — FINGOLIMOD HCL 0.5 MG PO CAPS: ORAL_CAPSULE | ORAL | 0 refills | Status: DC

## 2016-07-30 NOTE — Telephone Encounter (Signed)
Patient has a pending appt w/ Megan on 08/12/16.  Gilenya rx faxed and confirmed to [employer] at the number provided below.

## 2016-07-30 NOTE — Telephone Encounter (Signed)
Pt needs updated rx of Fingolimod HCl (GILENYA) 0.5 MG CAPS sent to [facility] Pt assistance program . Fax: [phone], phone: [phone] Please use Pt Id # on rx : [id number] along with her name

## 2016-08-12 ENCOUNTER — Encounter: Payer: Self-pay | Admitting: Adult Health

## 2016-08-12 ENCOUNTER — Ambulatory Visit (INDEPENDENT_AMBULATORY_CARE_PROVIDER_SITE_OTHER): Payer: 59 | Admitting: Adult Health

## 2016-08-12 VITALS — BP 106/76 | HR 70 | Resp 16 | Ht 71.0 in | Wt 218.5 lb

## 2016-08-12 DIAGNOSIS — G35 Multiple sclerosis: Secondary | ICD-10-CM

## 2016-08-12 DIAGNOSIS — R200 Anesthesia of skin: Secondary | ICD-10-CM | POA: Diagnosis not present

## 2016-08-12 DIAGNOSIS — Z5181 Encounter for therapeutic drug level monitoring: Secondary | ICD-10-CM

## 2016-08-12 DIAGNOSIS — R202 Paresthesia of skin: Secondary | ICD-10-CM

## 2016-08-12 MED ORDER — GABAPENTIN 300 MG PO CAPS: 300.0000 mg | ORAL_CAPSULE | Freq: Three times a day (TID) | ORAL | 11 refills | Status: DC

## 2016-08-12 NOTE — Patient Instructions (Signed)
Continue Gilenya and gabapentin Blood work today  MRI brain ordered If your symptoms worsen or you develop new symptoms please let us know.

## 2016-08-12 NOTE — Progress Notes (Signed)
PATIENT: Carol [name] DOB: 12/29/1985  REASON FOR VISIT: follow up- relapse remitting multiple sclerosis HISTORY FROM: patient  HISTORY OF PRESENT ILLNESS: [name] is a 30 year old female with a history of relapsed remitting multiple sclerosis. She returns today for follow-up. She is currently on Gilenya and tolerating it well. Denies any new numbness or weakness. Denies any changes with her gait or balance. Denies any changes with her bowels or bladder. Denies any changes with the vision. She continues to use gabapentin for muscle spasms. She reports that this works well for her. She states that she does have some weakness in the right upper thigh but this has been present since her diagnosis. She denies any new neurological symptoms. Her last MRI was in October 2016. She returns today for an evaluation.  HISTORY per Dr. Zannie CoveYan's notes:  [name] is a 30 year old female with a history of relapsing remitting multiple sclerosis.  In March 2013, she developed new-onset right eye pain and blurred vision. She was initially diagnosed with corneal abrasion. She went to the emergency room twice for this. She then followed up with ophthalmology, who ordered MRI of the brain and orbits. This showed acute right optic neuritis and 7-8 chronic to moderate plaques. She was diagnosed with probable multiple sclerosis  Around beginning of December 2013, patient developed new symptoms of numbness in her bilateral fingertips and bilateral toes, lasted for 2 weeks.  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 Patient also has intermittent muscle spasms and balance difficulty. No family history of MS. MRI brain showed approximately six lesions in the brain which are consistent with multiple sclerosis. there isabnormality in the right optic nerve consistent with multiple sclerosis, see below report. No enhancing  lesions are identified. MRI ORBITS: Mild swelling of hyperintensity in the right optic nerve is present. This shows abnormal enhancement with slight stranding in the surrounding orbital fat suggesting acute inflammation. Enhancement is most prominent within the optic canal on the right. Optic chiasm is normal. The left optic nerve is normal. She also complains of right low back pain, radiating pain to right leg, bilateral hands, and feet paresthesia, She was treated with Gilenya since March 2014, tolerated well, but has stopped Gilenya since March 2016 due to insurance reasons. She denies gait difficulty, moderate right side blurry vision at baseline  UPDATE Oct 6th 2016:She was treated at the end of August 2016 with prednisone tapering starting from 60 mg, for her bilateral feet numbness, right hand and arm numbness, which did help her symptoms Medrol pack was called in July 13 2015 for recurrent symptoms, she has not taken the second round of steroid yet, she complains of right-sided blurry vision, bilateral feet paresthesia, stiffness, right hand numbness, she denies significant gait difficulty, no bowel bladder incontinence. She has stopped Gilenya since March 2016, MRI of the brain and cervical is pending UPDATE 09/26/2015 CMMs. Carol [name], 30 year old female returns for follow-up. She has been previously followed by Dr. Terrace ArabiaYan and Everlene OtherMegan  [place] nurse practitioner. She was last seen October 6. She had a flare of symptoms in August and also September and received prednisone Dosepaks. She stopped her Gilenya in March 2016 because of insurance reasons and the patient was not aware she can get it through patient assistance. MRI of the brain with and without contrast on 08/07/2015 was abnormal 1. There are 3 small DWI hyperintensities in the bilateral parietal regions. 1  of these lesions on the left side is slightly hypointense on ADC map. No abnormal enhancing lesions. May represent acute-subacute  demyelinating plaques. 2. Several small periventricular, subcortical, juxtacortical and left posterior midbrain chronic demyelinating plaques.  3. Compared to MRI brain from 01/23/12, there are at least 6 new demyelinating plaques (3 are chronic, 2 are subacute, and 1 may be acute). She received first dose Gilenya 2 weeks ago and has restarted the medication. The numbness in her feet and hands is better but not completely gone she has not had any falls no bowel or bladder incontinence, denies blurred vision or loss of vision. She returns for reevaluation. She continues to work full-time  REVIEW OF SYSTEMS: Out of a complete 14 system review of symptoms, the patient complains only of the following symptoms, and all other reviewed systems are negative.  ALLERGIES: No Known Allergies  HOME MEDICATIONS: Outpatient Medications Prior to Visit  Medication Sig Dispense Refill  . Fingolimod HCl (GILENYA) 0.5 MG CAPS TAKE 1 CAPSULE (0.5MG  TOTAL) BY MOUTH DAILY 90 capsule 0  . gabapentin (NEURONTIN) 300 MG capsule Take 1 capsule (300 mg total) by mouth 3 (three) times daily. 90 capsule 11  . medroxyPROGESTERone (PROVERA) 5 MG tablet Take 1 tablet (5 mg total) by mouth daily. For 5 days every other month if no spontaneous menses 15 tablet 1   No facility-administered medications prior to visit.     PAST MEDICAL HISTORY: Past Medical History:  Diagnosis Date  . Low back pain 01/20/2013  . MS (multiple sclerosis) (HCC)   . Multiple sclerosis (HCC) 01/12/2013    PAST SURGICAL HISTORY: Past Surgical History:  Procedure Laterality Date  . NONE      FAMILY HISTORY: Family History  Problem Relation Age of Onset  . Hypertension Maternal Grandmother   . Diabetes Maternal Grandmother   . Hypertension Paternal Grandmother     SOCIAL HISTORY: Social History   Social History  . Marital status: Married    Spouse name: N/A  . Number of children: 0  . Years of education: 12   Occupational History    .      [employer]   Social History Main Topics  . Smoking status: Never Smoker  . Smokeless tobacco: Never Used  . Alcohol use No  . Drug use: No  . Sexual activity: Yes    Partners: Male   Other Topics Concern  . Not on file   Social History Narrative   Patient is working at [employer] time., lives with her husband, no children.   Patient is right handed.   Patient has a high school education and some college.   Patient does not drink caffeine.      PHYSICAL EXAM  Vitals:   08/12/16 1429  BP: 106/76  Pulse: 70  Resp: 16  Weight: 218 lb 8 oz (99.1 kg)  Height: 5\' 11"  (1.803 m)   Body mass index is 30.47 kg/m.  Generalized: Well developed, in no acute distress   Neurological examination  Mentation: Alert oriented to time, place, history taking. Follows all commands speech and language fluent Cranial nerve II-XII: Pupils were equal round reactive to light. Extraocular movements were full, visual field were full on confrontational test. Facial sensation and strength were normal. Uvula tongue midline. Head turning and shoulder shrug  were normal and symmetric. Motor: The motor testing reveals 5 over 5 strength of all 4 extremities. Mild Proximal weakness in the right lower extremity. Good symmetric  motor tone is noted throughout.  Sensory: Sensory testing is intact to soft touch on all 4 extremities. No evidence of extinction is noted.  Coordination: Cerebellar testing reveals good finger-nose-finger and heel-to-shin bilaterally.  Gait and station: Gait is normal. Tandem gait is normal. Romberg is negative. No drift is seen.  Reflexes: Deep tendon reflexes are symmetric and normal bilaterally.   DIAGNOSTIC DATA (LABS, IMAGING, TESTING) - I reviewed patient records, labs, notes, testing and imaging myself where available.  Lab Results  Component Value Date   WBC 8.4 06/13/2015   HGB 12.6 09/12/2014   HCT 35.4 06/13/2015   MCV 72 (L) 06/13/2015    PLT 194 06/13/2015      Component Value Date/Time   NA 141 06/13/2015 1416   K 4.3 06/13/2015 1416   CL 100 06/13/2015 1416   CO2 25 06/13/2015 1416   GLUCOSE 85 06/13/2015 1416   BUN 6 06/13/2015 1416   CREATININE 0.61 06/13/2015 1416   CALCIUM 9.5 06/13/2015 1416   PROT 7.3 06/13/2015 1416   ALBUMIN 4.0 06/13/2015 1416   AST 13 06/13/2015 1416   ALT 9 06/13/2015 1416   ALKPHOS 77 06/13/2015 1416   BILITOT 0.4 06/13/2015 1416   GFRNONAA 123 06/13/2015 1416   GFRAA 142 06/13/2015 1416       ASSESSMENT AND PLAN 30 y.o. year old female  has a past medical history of Low back pain (01/20/2013); MS (multiple sclerosis) (HCC); and Multiple sclerosis (HCC) (01/12/2013). here with:  1. Multiple sclerosis  Overall the patient is doing well. She will continue on Gilenya. I will check blood work today. We will recheck an MRI of the brain to look for progression of multiple sclerosis. Advised that if her symptoms worsen or she develops any new symptoms she should let us know. Follow-up in 6 months or sooner if needed.     Butch Penny, MSN, NP-C 08/12/2016, 2:47 PM [facility] [address] [place], [place] [address] [phone]

## 2016-08-13 LAB — CBC WITH DIFFERENTIAL/PLATELET
BASOS: 0 %
Basophils Absolute: 0 10*3/uL (ref 0.0–0.2)
EOS (ABSOLUTE): 0.1 10*3/uL (ref 0.0–0.4)
Eos: 1 %
Hematocrit: 31.7 % — ABNORMAL LOW (ref 34.0–46.6)
Hemoglobin: 10.4 g/dL — ABNORMAL LOW (ref 11.1–15.9)
Immature Grans (Abs): 0 10*3/uL (ref 0.0–0.1)
Immature Granulocytes: 0 %
Lymphocytes Absolute: 1.1 10*3/uL (ref 0.7–3.1)
Lymphs: 11 %
MCH: 22.7 pg — AB (ref 26.6–33.0)
MCHC: 32.8 g/dL (ref 31.5–35.7)
MCV: 69 fL — AB (ref 79–97)
MONOS ABS: 1 10*3/uL — AB (ref 0.1–0.9)
Monocytes: 10 %
NEUTROS ABS: 7.4 10*3/uL — AB (ref 1.4–7.0)
NEUTROS PCT: 78 %
PLATELETS: 203 10*3/uL (ref 150–379)
RBC: 4.58 x10E6/uL (ref 3.77–5.28)
RDW: 16.6 % — AB (ref 12.3–15.4)
WBC: 9.7 10*3/uL (ref 3.4–10.8)

## 2016-08-13 LAB — COMPREHENSIVE METABOLIC PANEL
A/G RATIO: 1.3 (ref 1.2–2.2)
ALT: 16 IU/L (ref 0–32)
AST: 19 IU/L (ref 0–40)
Albumin: 3.9 g/dL (ref 3.5–5.5)
Alkaline Phosphatase: 99 IU/L (ref 39–117)
BILIRUBIN TOTAL: 0.3 mg/dL (ref 0.0–1.2)
BUN/Creatinine Ratio: 9 (ref 9–23)
BUN: 8 mg/dL (ref 6–20)
CALCIUM: 9.5 mg/dL (ref 8.7–10.2)
CO2: 28 mmol/L (ref 18–29)
Chloride: 104 mmol/L (ref 96–106)
Creatinine, Ser: 0.87 mg/dL (ref 0.57–1.00)
GFR, EST AFRICAN AMERICAN: 103 mL/min/{1.73_m2} (ref >59)
GFR, EST NON AFRICAN AMERICAN: 90 mL/min/{1.73_m2} (ref >59)
GLOBULIN, TOTAL: 2.9 g/dL (ref 1.5–4.5)
Glucose: 80 mg/dL (ref 65–99)
POTASSIUM: 4.5 mmol/L (ref 3.5–5.2)
SODIUM: 144 mmol/L (ref 134–144)
Total Protein: 6.8 g/dL (ref 6.0–8.5)

## 2016-08-14 ENCOUNTER — Telehealth: Payer: Self-pay | Admitting: *Deleted

## 2016-08-14 NOTE — Telephone Encounter (Signed)
[employer] Patient Assistance form completed, signed by Dr. Terrace Arabia, and faxed to [employer] at fax# [phone]/fim

## 2016-08-15 ENCOUNTER — Telehealth: Payer: Self-pay | Admitting: *Deleted

## 2016-08-15 NOTE — Telephone Encounter (Signed)
Pt returned RN's call °

## 2016-08-15 NOTE — Telephone Encounter (Signed)
LMTC./fim 

## 2016-08-15 NOTE — Telephone Encounter (Signed)
Patient returned Carol [name]'s call, please call [phone].

## 2016-08-15 NOTE — Telephone Encounter (Signed)
Pt [employer]  Form faxed on 08/15/16 to [phone].

## 2016-08-15 NOTE — Telephone Encounter (Signed)
-----   Message from Megan Millikan, NP sent at 08/14/2016  1:07 PM EDT ----- Lab work shows mildly decreased hgb and hct, mcv and mch. Neutrophils and monocytes mildly elevated. Will continue to monitor. Please forward to PCP and callpatient  with results. 

## 2016-08-16 NOTE — Telephone Encounter (Signed)
I have spoken with Zinab this morning and reviewed lab results.  Results faxed to [facility] fax# [phone]/fim

## 2016-08-16 NOTE — Telephone Encounter (Signed)
I have spoken with Carol [name] this morning and reviewed lab results.  Results faxed to [facility] fax# [phone]/fim 

## 2016-08-16 NOTE — Telephone Encounter (Signed)
-----   Message from Butch Penny, NP sent at 08/14/2016  1:07 PM EDT ----- Lab work shows mildly decreased hgb and hct, mcv and mch. Neutrophils and monocytes mildly elevated. Will continue to monitor. Please forward to PCP and callpatient  with results.

## 2016-08-19 NOTE — Progress Notes (Signed)
I have reviewed and agreed above plan. 

## 2016-09-11 ENCOUNTER — Ambulatory Visit (INDEPENDENT_AMBULATORY_CARE_PROVIDER_SITE_OTHER): Payer: 59

## 2016-09-11 DIAGNOSIS — G35 Multiple sclerosis: Secondary | ICD-10-CM | POA: Diagnosis not present

## 2016-09-16 MED ORDER — GADOPENTETATE DIMEGLUMINE 469.01 MG/ML IV SOLN: 20.0000 mL | Freq: Once | INTRAVENOUS | Status: AC | PRN

## 2016-09-19 ENCOUNTER — Telehealth: Payer: Self-pay | Admitting: *Deleted

## 2016-09-19 NOTE — Telephone Encounter (Signed)
Spoke to pt and relayed that she has 2 new lesions since last MRI done 2016.  She had been off Gilenya for 5months.  Will repeat MRI in 6 months, has appt 02-10-17 RV.  MRI to be reviewed by Dr. Terrace ArabiaYan, no sx per pt.  She is to call if things change.  She verbalized understanding.

## 2016-09-19 NOTE — Telephone Encounter (Signed)
-----   Message from Butch Penny, NP sent at 09/18/2016  4:32 PM EST ----- 2 lesions are present that was not seen on exam in 2016 however the patient was off Gilenya for a while. Will need to repeat scans in 6 months or so. Please call patient. I will also forward to Dr. Terrace Arabia for her review. On exam she did not have any new symptoms.

## 2016-10-11 ENCOUNTER — Telehealth: Payer: Self-pay | Admitting: Neurology

## 2016-10-11 NOTE — Telephone Encounter (Signed)
Patient is calling to discuss FMLA paperwork. She says there are discrepancies. Please call and discuss.

## 2016-10-11 NOTE — Telephone Encounter (Signed)
Note    Patient is calling to discuss FMLA paperwork. She says there are discrepancies. Please call and discuss.

## 2016-10-14 ENCOUNTER — Encounter (HOSPITAL_COMMUNITY): Payer: Self-pay | Admitting: Emergency Medicine

## 2016-10-14 ENCOUNTER — Emergency Department (HOSPITAL_COMMUNITY)
Admission: EM | Admit: 2016-10-14 | Discharge: 2016-10-14 | Disposition: A | Discharge status: Home / Self Care | Payer: 59 | Attending: Emergency Medicine | Admitting: Emergency Medicine

## 2016-10-14 DIAGNOSIS — Y92410 Unspecified street and highway as the place of occurrence of the external cause: Secondary | ICD-10-CM | POA: Diagnosis not present

## 2016-10-14 DIAGNOSIS — Y999 Unspecified external cause status: Secondary | ICD-10-CM | POA: Diagnosis not present

## 2016-10-14 DIAGNOSIS — M5417 Radiculopathy, lumbosacral region: Secondary | ICD-10-CM | POA: Diagnosis not present

## 2016-10-14 DIAGNOSIS — Y9389 Activity, other specified: Secondary | ICD-10-CM | POA: Diagnosis not present

## 2016-10-14 DIAGNOSIS — S73101A Unspecified sprain of right hip, initial encounter: Secondary | ICD-10-CM | POA: Diagnosis not present

## 2016-10-14 DIAGNOSIS — S79911A Unspecified injury of right hip, initial encounter: Secondary | ICD-10-CM | POA: Diagnosis present

## 2016-10-14 MED ORDER — MELOXICAM 15 MG PO TABS: 15.0000 mg | ORAL_TABLET | Freq: Every day | ORAL | 0 refills | Status: DC

## 2016-10-14 MED ORDER — METHOCARBAMOL 500 MG PO TABS: 500.0000 mg | ORAL_TABLET | Freq: Two times a day (BID) | ORAL | 0 refills | Status: DC

## 2016-10-14 NOTE — Discharge Instructions (Signed)
Mobic for pain and inflammation. Robaxin for spasms. Ice/heat to your back/hip. Follow up with family doctor if not improving in 5 days. Return if any numbness or weakness in extremities or any new concerning symptoms.
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
## 2016-10-16 NOTE — Telephone Encounter (Signed)
States her employer told her we put an end date on her FLMA paperwork.  I pulled the paperwork and her condition is marked "lifelong".  She is going to check with her HR dept to see what she will need to keep her paperwork current.

## 2016-10-29 ENCOUNTER — Other Ambulatory Visit: Payer: Self-pay | Admitting: *Deleted

## 2016-10-29 ENCOUNTER — Telehealth: Payer: Self-pay | Admitting: Neurology

## 2016-10-29 MED ORDER — FINGOLIMOD HCL 0.5 MG PO CAPS: ORAL_CAPSULE | ORAL | Status: DC

## 2016-10-29 NOTE — Telephone Encounter (Signed)
Pt called said she rec'd a call from [facility] yesterday and the FMLA was filled out the same as the 1st time so it has been denied again. She has information on what needs to be completed.  She can be reached a t [phone]

## 2016-10-29 NOTE — Telephone Encounter (Signed)
Called and spoke to Patient she is going to fax me proof of income and her signed paper for [employer] . Fax number she is sending to [phone]. Att. Annabelle Harman Cox

## 2016-10-29 NOTE — Telephone Encounter (Signed)
Returned call to patient - discussed requested changes to her FMLA - updated and faxed to her employer again.  We will keep a copy here to address any continued problems.  She will let me know if she needs further help.

## 2016-10-31 NOTE — Telephone Encounter (Signed)
Patient did not show on 10/30/2016 to bring me proof of income.

## 2016-11-05 ENCOUNTER — Telehealth: Payer: Self-pay | Admitting: Neurology

## 2016-11-05 ENCOUNTER — Other Ambulatory Visit: Payer: Self-pay | Admitting: *Deleted

## 2016-11-05 MED ORDER — FINGOLIMOD HCL 0.5 MG PO CAPS: ORAL_CAPSULE | ORAL | 3 refills | Status: DC

## 2016-11-05 NOTE — Telephone Encounter (Signed)
Returned call to [employer] - updated start form and prescription faxed to program.

## 2016-11-05 NOTE — Telephone Encounter (Signed)
[facility]/[phone] (can call with VO)   (f) [phone] said pt 's ins has changed, they are in the process of verifying it but the pt is down to 3 pills. They would like temporary RX. She said they also need an updated start form for the program.

## 2016-11-28 ENCOUNTER — Telehealth: Payer: Self-pay | Admitting: Neurology

## 2016-11-28 NOTE — Telephone Encounter (Signed)
Chris with [employer] called office in reference to patients Fingolimod HCl (GILENYA) 0.5 MG CAPS.  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  Please call

## 2016-11-28 NOTE — Telephone Encounter (Signed)
PA approved by [facility] [phone]) through 11/28/2021 - QI#[id number] - member GL#[id number].

## 2016-11-29 NOTE — Telephone Encounter (Signed)
Received fax from [facility] confirming approval.

## 2017-02-10 ENCOUNTER — Telehealth: Payer: Self-pay

## 2017-02-10 ENCOUNTER — Ambulatory Visit: Payer: 59 | Admitting: Adult Health

## 2017-02-10 NOTE — Telephone Encounter (Signed)
Patient did not show for appt today.                         

## 2017-02-11 ENCOUNTER — Encounter: Payer: Self-pay | Admitting: Adult Health

## 2017-03-03 ENCOUNTER — Encounter: Payer: Self-pay | Admitting: Obstetrics and Gynecology

## 2017-03-03 ENCOUNTER — Ambulatory Visit (INDEPENDENT_AMBULATORY_CARE_PROVIDER_SITE_OTHER): Payer: 59 | Admitting: Obstetrics and Gynecology

## 2017-03-03 ENCOUNTER — Ambulatory Visit: Payer: 59 | Admitting: Obstetrics and Gynecology

## 2017-03-03 VITALS — BP 104/66 | HR 80 | Resp 14 | Ht 71.0 in | Wt 211.0 lb

## 2017-03-03 DIAGNOSIS — Z0000 Encounter for general adult medical examination without abnormal findings: Secondary | ICD-10-CM

## 2017-03-03 DIAGNOSIS — Z01419 Encounter for gynecological examination (general) (routine) without abnormal findings: Secondary | ICD-10-CM | POA: Diagnosis not present

## 2017-03-03 DIAGNOSIS — Z23 Encounter for immunization: Secondary | ICD-10-CM

## 2017-03-03 DIAGNOSIS — Z862 Personal history of diseases of the blood and blood-forming organs and certain disorders involving the immune mechanism: Secondary | ICD-10-CM

## 2017-03-03 DIAGNOSIS — N915 Oligomenorrhea, unspecified: Secondary | ICD-10-CM

## 2017-03-03 DIAGNOSIS — Z3169 Encounter for other general counseling and advice on procreation: Secondary | ICD-10-CM | POA: Diagnosis not present

## 2017-03-03 DIAGNOSIS — Z833 Family history of diabetes mellitus: Secondary | ICD-10-CM | POA: Diagnosis not present

## 2017-03-03 LAB — CBC
HCT: 32.8 % — ABNORMAL LOW (ref 35.0–45.0)
Hemoglobin: 10.6 g/dL — ABNORMAL LOW (ref 11.7–15.5)
MCH: 23 pg — AB (ref 27.0–33.0)
MCHC: 32.3 g/dL (ref 32.0–36.0)
MCV: 71.3 fL — AB (ref 80.0–100.0)
PLATELETS: 195 10*3/uL (ref 140–400)
RBC: 4.6 MIL/uL (ref 3.80–5.10)
RDW: 17.7 % — ABNORMAL HIGH (ref 11.0–15.0)
WBC: 7.7 10*3/uL (ref 3.8–10.8)

## 2017-03-03 MED ORDER — MEDROXYPROGESTERONE ACETATE 5 MG PO TABS: 5.0000 mg | ORAL_TABLET | Freq: Every day | ORAL | 1 refills | Status: DC

## 2017-03-03 NOTE — Progress Notes (Signed)
31 y.o. G0P0000 MarriedAfrican AmericanF here for annual exam.   She is sexually active without contraception for 8-9 years. She would like to get pregnant. She is on vitamins.  Prior normal blood work for oligomenorrhea (in the chart). Cycles q 4-12, mostly about 6 weeks.  No dyspareunia.  Her MS has been in remission.  Period Duration (Days): 3-5 days  Period Pattern: (!) Irregular Menstrual Flow: Moderate Menstrual Control: Tampon, Thin pad, Maxi pad Menstrual Control Change Freq (Hours): changes pad or tampon every 3 hours  Dysmenorrhea: None  Patient's last menstrual period was 02/10/2017.          Sexually active: Yes.    The current method of family planning is none.    Exercising: Yes.    walking Smoker:  no  Health Maintenance: Pap:  02-29-16 WNL NEG HR HPV 01-04-14 WNL History of abnormal Pap:  no TDaP:  unsure Gardasil: No   reports that she has never smoked. She has never used smokeless tobacco. She reports that she does not drink alcohol or use drugs.Homemaker.   Past Medical History:  Diagnosis Date  . Low back pain 01/20/2013  . MS (multiple sclerosis) (HCC)   . Multiple sclerosis (HCC) 01/12/2013    Past Surgical History:  Procedure Laterality Date  . NONE      Current Outpatient Prescriptions  Medication Sig Dispense Refill  . Fingolimod HCl (GILENYA) 0.5 MG CAPS TAKE 1 CAPSULE (0.5MG  TOTAL) BY MOUTH DAILY 30 capsule 3  . gabapentin (NEURONTIN) 300 MG capsule Take 1 capsule (300 mg total) by mouth 3 (three) times daily. 90 capsule 11   No current facility-administered medications for this visit.    Facility-Administered Medications Ordered in Other Visits  Medication Dose Route Frequency Provider Last Rate Last Dose  . gadopentetate dimeglumine (MAGNEVIST) injection 20 mL  20 mL Intravenous Once PRN Butch Penny, NP        Family History  Problem Relation Age of Onset  . Hypertension Maternal Grandmother   . Diabetes Maternal Grandmother   .  Hypertension Paternal Grandmother     Review of Systems  Constitutional: Negative.   HENT: Negative.   Eyes: Negative.   Respiratory: Negative.   Cardiovascular: Negative.   Gastrointestinal: Negative.   Endocrine: Negative.   Genitourinary: Negative.   Musculoskeletal: Negative.   Skin: Negative.   Allergic/Immunologic: Negative.   Neurological: Negative.   Psychiatric/Behavioral: Negative.     Exam:   BP 104/66 (BP Location: Right Arm, Patient Position: Sitting, Cuff Size: Normal)   Pulse 80   Resp 14   Ht 5\' 11"  (1.803 m)   Wt 211 lb (95.7 kg)   LMP 02/10/2017   BMI 29.43 kg/m   Weight change: @WEIGHTCHANGE @ Height:   Height: 5\' 11"  (180.3 cm)  Ht Readings from Last 3 Encounters:  03/03/17 5\' 11"  (1.803 m)  10/14/16 5\' 11"  (1.803 m)  08/12/16 5\' 11"  (1.803 m)    General appearance: alert, cooperative and appears stated age Head: Normocephalic, without obvious abnormality, atraumatic Neck: no adenopathy, supple, symmetrical, trachea midline and thyroid normal to inspection and palpation Lungs: clear to auscultation bilaterally Cardiovascular: regular rate and rhythm Breasts: normal appearance, no masses or tenderness Abdomen: soft, non-tender; bowel sounds normal; no masses,  no organomegaly Extremities: extremities normal, atraumatic, no cyanosis or edema Skin: Skin color, texture, turgor normal. No rashes or lesions Lymph nodes: Cervical, supraclavicular, and axillary nodes normal. No abnormal inguinal nodes palpated Neurologic: Grossly normal   Pelvic: External genitalia:  no lesions              Urethra:  normal appearing urethra with no masses, tenderness or lesions              Bartholins and Skenes: normal                 Vagina: normal appearing vagina with normal color and discharge, no lesions              Cervix: no lesions               Bimanual Exam:  Uterus:  normal size, contour, position, consistency, mobility, non-tender              Adnexa:  no mass, fullness, tenderness               Rectovaginal: Confirms               Anus:  normal sphincter tone, no lesions  Chaperone was present for exam.  A:  Well Woman with normal exam  H/O anemia (bleeding doesn't sound excessive)  Oligomenorrhea, prior normal labs  Discussed the importance of endometrial protection  Family history of diabetes  P:   CBC, Ferritin, lipid panel, HgbA1C (normal CMP last fall)  TDAP  No pap this year  Will treat with provera (knows to abstain x 2 weeks and check a UPT first)  She will call on her cycle to set up an HSG, will pre-treat with doxycycline  Husband will get a semen analysis Luticia Tadros, [birth year])  BBT charts given  If HSG is normal will treat with Clomid  Discussed breast self exam  Discussed calcium and vit D intake  Continue MV  She will talk to her Neurolgist about her medication for MS  In addition to her annual exam, over 10 minutes was spent face to face counseling the patient on infertility

## 2017-03-03 NOTE — Patient Instructions (Addendum)
No unprotected intercourse x 2 weeks  Check a pregnancy test, if negative, start provera  Call with cycle, if you don't bleed within a week of ending the provera call. We willset up the HSG right after your cycle.   As long as your testing is normal I will call in clomid for you with your next cycle  Start the BBT chart as soon as you finish clomid  You are most likely to ovulate 5-12 days after taking the clomid, intercourse every 24-36 hours starting 2-3 days after finishing the clomid  EXERCISE AND DIET:  We recommended that you start or continue a regular exercise program for good health. Regular exercise means any activity that makes your heart beat faster and makes you sweat.  We recommend exercising at least 30 minutes per day at least 3 days a week, preferably 4 or 5.  We also recommend a diet low in fat and sugar.  Inactivity, poor dietary choices and obesity can cause diabetes, heart attack, stroke, and kidney damage, among others.    ALCOHOL AND SMOKING:  Women should limit their alcohol intake to no more than 7 drinks/beers/glasses of wine (combined, not each!) per week. Moderation of alcohol intake to this level decreases your risk of breast cancer and liver damage. And of course, no recreational drugs are part of a healthy lifestyle.  And absolutely no smoking or even second hand smoke. Most people know smoking can cause heart and lung diseases, but did you know it also contributes to weakening of your bones? Aging of your skin?  Yellowing of your teeth and nails?  CALCIUM AND VITAMIN D:  Adequate intake of calcium and Vitamin D are recommended.  The recommendations for exact amounts of these supplements seem to change often, but generally speaking 600 mg of calcium (either carbonate or citrate) and 800 units of Vitamin D per day seems prudent. Certain women may benefit from higher intake of Vitamin D.  If you are among these women, your doctor will have told you during your visit.     PAP SMEARS:  Pap smears, to check for cervical cancer or precancers,  have traditionally been done yearly, although recent scientific advances have shown that most women can have pap smears less often.  However, every woman still should have a physical exam from her gynecologist every year. It will include a breast check, inspection of the vulva and vagina to check for abnormal growths or skin changes, a visual exam of the cervix, and then an exam to evaluate the size and shape of the uterus and ovaries.  And after 31 years of age, a rectal exam is indicated to check for rectal cancers. We will also provide age appropriate advice regarding health maintenance, like when you should have certain vaccines, screening for sexually transmitted diseases, bone density testing, colonoscopy, mammograms, etc.   MAMMOGRAMS:  All women over 81 years old should have a yearly mammogram. Many facilities now offer a "3D" mammogram, which may cost around $50 extra out of pocket. If possible,  we recommend you accept the option to have the 3D mammogram performed.  It both reduces the number of women who will be called back for extra views which then turn out to be normal, and it is better than the routine mammogram at detecting truly abnormal areas.    COLONOSCOPY:  Colonoscopy to screen for colon cancer is recommended for all women at age 104.  We know, you hate the idea of the prep.  We agree, BUT, having colon cancer and not knowing it is worse!!  Colon cancer so often starts as a polyp that can be seen and removed at colonscopy, which can quite literally save your life!  And if your first colonoscopy is normal and you have no family history of colon cancer, most women don't have to have it again for 10 years.  Once every ten years, you can do something that may end up saving your life, right?  We will be happy to help you get it scheduled when you are ready.  Be sure to check your insurance coverage so you understand how  much it will cost.  It may be covered as a preventative service at no cost, but you should check your particular policy.     Hysterosalpingography Hysterosalpingography is a procedure to look inside your uterus and fallopian tubes. During this procedure, contrast dye is injected into your uterus through your vagina and cervix to illuminate your uterus while X-ray pictures are taken. This procedure may help your health care provider determine whether you have uterine tumors, adhesions, or structural abnormalities. It is commonly used to help determine why a woman is unable to have children (infertility). The procedure usually lasts about 15-30 minutes. LET [facility] CARE PROVIDER KNOW ABOUT:  Any allergies you have.  All medicines you are taking, including vitamins, herbs, eye drops, creams, and over-the-counter medicines.  Previous problems you or members of your family have had with the use of anesthetics.  Any blood disorders you have.  Previous surgeries you have had.  Medical conditions you have. RISKS AND COMPLICATIONS Generally, this is a safe procedure. However, as with any procedure, problems can occur. Possible problems include:  Infection in the lining of the uterus (endometritis) or fallopian tubes (salpingitis).  Damage or perforation of the uterus or fallopian tubes.  An allergic reaction to the contrast dye used to perform the X-ray. BEFORE THE PROCEDURE  Schedule the procedure after your period stops, but before your next ovulation. This is usually between day 5 and day 10 of your last period. Day 1 is the first day of your period.  Ask your health care provider about changing or stopping your regular medicines.  You may eat and drink as normal.  Empty your bladder before the procedure begins. PROCEDURE  You may be given a medicine to relax you (sedative) or an over-the-counter pain medicine to lessen any discomfort during the procedure.  You will lie down on  an X-ray table with your feet in stirrups.  A device called a speculum will be placed into your vagina. This allows your health care provider to see inside your vagina to the cervix.  The cervix will be washed with a special soap.  A thin, flexible tube will be passed through the cervix into the uterus.  Contrast dye will be put into this tube.  Several X-rays will be taken as the contrast dye spreads through the uterus and fallopian tubes.  The tube will be taken out after the procedure. What to expect after the procedure  Most of the contrast dye will flow out of your vagina naturally. You may want to wear a sanitary pad.  You may feel mild cramping and notice a little bleeding from your vagina. This should go away in 24 hours.  Ask when your test results will be ready. Make sure you get your test results. This information is not intended to replace advice given to you by your health  care provider. Make sure you discuss any questions you have with your health care provider. Document Released: 11/09/2004 Document Revised: 03/14/2016 Document Reviewed: 04/09/2013 Elsevier Interactive Patient Education  2017 [employer].

## 2017-03-04 LAB — LIPID PANEL
Cholesterol: 159 mg/dL (ref <200)
HDL: 50 mg/dL — ABNORMAL LOW (ref >50)
LDL Cholesterol: 96 mg/dL (ref <100)
Total CHOL/HDL Ratio: 3.2 Ratio (ref <5.0)
Triglycerides: 63 mg/dL (ref <150)
VLDL: 13 mg/dL (ref <30)

## 2017-03-04 LAB — HEMOGLOBIN A1C
HEMOGLOBIN A1C: 5.2 % (ref <5.7)
MEAN PLASMA GLUCOSE: 103 mg/dL

## 2017-03-04 LAB — FERRITIN: Ferritin: 8 ng/mL — ABNORMAL LOW (ref 10–154)

## 2017-03-06 ENCOUNTER — Telehealth: Payer: Self-pay

## 2017-03-06 DIAGNOSIS — D509 Iron deficiency anemia, unspecified: Secondary | ICD-10-CM

## 2017-03-06 NOTE — Telephone Encounter (Signed)
Spoke with patient. Advised of message as seen below from Dr.Jertson. Patient verbalizes understanding. Will start taking OTC iron supplement daily. Referral placed to Dr.Ennever. Patient is aware she will be contacted regarding scheduling this appointment.  Routing to provider for final review. Patient agreeable to disposition. Will close encounter.

## 2017-03-06 NOTE — Telephone Encounter (Signed)
-----   Message from Romualdo Bolk, MD sent at 03/05/2017  5:23 PM EDT ----- Please let the patient know that she is still anemic with low iron stores. She should be on an OTC daily iron supplement. Please set her up for a hematology consult for evaluation of her anemia. Her bleeding doesn't sound heavy enough to cause her anemia.  Her HDL (good cholesterol) is minimally low, the rest of her lab work is normal. She should eat a healthy diet low in saturated fats and have her fasting lipid panel checked next year.

## 2017-03-28 ENCOUNTER — Ambulatory Visit: Payer: No Typology Code available for payment source | Admitting: Family

## 2017-03-28 ENCOUNTER — Other Ambulatory Visit: Payer: No Typology Code available for payment source

## 2017-03-28 ENCOUNTER — Ambulatory Visit: Payer: No Typology Code available for payment source

## 2017-03-31 ENCOUNTER — Other Ambulatory Visit: Payer: Self-pay | Admitting: Family

## 2017-03-31 DIAGNOSIS — D649 Anemia, unspecified: Secondary | ICD-10-CM

## 2017-04-01 ENCOUNTER — Ambulatory Visit: Payer: 59

## 2017-04-01 ENCOUNTER — Ambulatory Visit: Payer: 59 | Admitting: Family

## 2017-04-01 ENCOUNTER — Other Ambulatory Visit: Payer: 59

## 2017-04-02 ENCOUNTER — Ambulatory Visit (HOSPITAL_BASED_OUTPATIENT_CLINIC_OR_DEPARTMENT_OTHER): Payer: No Typology Code available for payment source | Admitting: Family

## 2017-04-02 ENCOUNTER — Ambulatory Visit: Payer: 59

## 2017-04-02 ENCOUNTER — Other Ambulatory Visit: Payer: 59

## 2017-04-02 ENCOUNTER — Other Ambulatory Visit (HOSPITAL_BASED_OUTPATIENT_CLINIC_OR_DEPARTMENT_OTHER): Payer: 59

## 2017-04-02 ENCOUNTER — Ambulatory Visit: Payer: 59 | Admitting: Family

## 2017-04-02 VITALS — BP 135/89 | HR 69 | Temp 97.9°F | Resp 16 | Ht 71.0 in | Wt 208.0 lb

## 2017-04-02 DIAGNOSIS — D509 Iron deficiency anemia, unspecified: Secondary | ICD-10-CM | POA: Insufficient documentation

## 2017-04-02 DIAGNOSIS — D560 Alpha thalassemia: Secondary | ICD-10-CM

## 2017-04-02 DIAGNOSIS — D500 Iron deficiency anemia secondary to blood loss (chronic): Secondary | ICD-10-CM

## 2017-04-02 DIAGNOSIS — D649 Anemia, unspecified: Secondary | ICD-10-CM

## 2017-04-02 LAB — CBC WITH DIFFERENTIAL (CANCER CENTER ONLY)
BASO#: 0 10e3/uL (ref 0.0–0.2)
BASO%: 0.1 % (ref 0.0–2.0)
EOS%: 0.5 % (ref 0.0–7.0)
Eosinophils Absolute: 0 10e3/uL (ref 0.0–0.5)
HCT: 31.8 % — ABNORMAL LOW (ref 34.8–46.6)
HGB: 10.9 g/dL — ABNORMAL LOW (ref 11.6–15.9)
LYMPH#: 0.7 10e3/uL — ABNORMAL LOW (ref 0.9–3.3)
LYMPH%: 9.9 % — ABNORMAL LOW (ref 14.0–48.0)
MCH: 23.8 pg — ABNORMAL LOW (ref 26.0–34.0)
MCHC: 34.3 g/dL (ref 32.0–36.0)
MCV: 69 fL — ABNORMAL LOW (ref 81–101)
MONO#: 0.6 10e3/uL (ref 0.1–0.9)
MONO%: 8.4 % (ref 0.0–13.0)
NEUT#: 6 10e3/uL (ref 1.5–6.5)
NEUT%: 81.1 % — ABNORMAL HIGH (ref 39.6–80.0)
Platelets: 191 10e3/uL (ref 145–400)
RBC: 4.58 10e6/uL (ref 3.70–5.32)
RDW: 15.4 % (ref 11.1–15.7)
WBC: 7.4 10e3/uL (ref 3.9–10.0)

## 2017-04-02 LAB — COMPREHENSIVE METABOLIC PANEL (CC13)
ALK PHOS: 92 IU/L (ref 39–117)
ALT: 20 IU/L (ref 0–32)
AST: 18 IU/L (ref 0–40)
Albumin, Serum: 4.2 g/dL (ref 3.5–5.5)
Albumin/Globulin Ratio: 1.2 (ref 1.2–2.2)
BILIRUBIN TOTAL: 0.3 mg/dL (ref 0.0–1.2)
BUN/Creatinine Ratio: 10 (ref 9–23)
BUN: 7 mg/dL (ref 6–20)
CALCIUM: 9.6 mg/dL (ref 8.7–10.2)
CHLORIDE: 103 mmol/L (ref 96–106)
Carbon Dioxide, Total: 29 mmol/L (ref 20–29)
Creatinine, Ser: 0.71 mg/dL (ref 0.57–1.00)
GFR calc Af Amer: 131 mL/min/{1.73_m2} (ref >59)
GFR calc non Af Amer: 114 mL/min/{1.73_m2} (ref >59)
Globulin, Total: 3.4 g/dL (ref 1.5–4.5)
Glucose: 90 mg/dL (ref 65–99)
Potassium, Ser: 3.7 mmol/L (ref 3.5–5.2)
Sodium: 137 mmol/L (ref 134–144)
Total Protein: 7.6 g/dL (ref 6.0–8.5)

## 2017-04-02 LAB — TECHNOLOGIST REVIEW CHCC SATELLITE

## 2017-04-02 LAB — CHCC SATELLITE - SMEAR

## 2017-04-02 MED ORDER — FUSION PLUS PO CAPS: 1.0000 | ORAL_CAPSULE | Freq: Every day | ORAL | 6 refills | Status: DC

## 2017-04-02 MED ORDER — FOLIC ACID 1 MG PO TABS: 1.0000 mg | ORAL_TABLET | Freq: Every day | ORAL | 6 refills | Status: DC

## 2017-04-02 NOTE — Progress Notes (Signed)
Hematology/Oncology Consultation   Name: Carol [name]      MRN: [id number]    Location: Room/bed info not found  Date: 04/02/2017 Time:2:38 PM   REFERRING PHYSICIAN: Gertie Exon, MD  REASON FOR CONSULT: Iron deficiency anemia    DIAGNOSIS:  Iron deficiency anemia   HISTORY OF PRESENT ILLNESS: Carol [name] is a very pleasant 31 yo African American female with iron deficiency anemia. Hgb is 10.9 with a low MCV of 69. Alpha thalassemia genotype and iron studies for today are pending. Ferritin last month was 8.  She has not tried taking an oral iron supplement yet.  She denies fatigue. She has had chills and craves ice.  Her paternal grandmother, paternal cousin and maternal cousin also have iron deficiency but she is unaware of any treatment, if any, they receive at this time.  Her cycles are irregular but are not heavy. She has not noticed any clots. She has not yet started the Provera her gynecologist was going to have her try.  She has history of miscarriage.  No history of thrombus. No personal or familial history of sickle cell or cancer that she is aware of.  No history of surgery. She has multiple tattoos. No history of bleeding, bruising or petechiae.  She has MS and does experience some numbness and tingling in her extremities that comes and goes. This also effects her balance. She is on Neurontin and Gilenya daily. She denies any falls or syncopal episodes.  No fever, chills, n/v, cough, rash, dizziness, SOB, chest pain, palpitations, abdominal pain or changes in bowel or bladder habits.  No swelling or tenderness in her extremities at this time. No c/o pain.  She has maintained a good appetite and is staying well hydrated. Her weight is stable.  She is not a smoker, no ETOH. 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   ROS: All other 10 point review of systems is negative.   PAST MEDICAL HISTORY:   Past Medical History:  Diagnosis Date  .  Low back pain 01/20/2013  . MS (multiple sclerosis) (HCC)   . Multiple sclerosis (HCC) 01/12/2013    ALLERGIES: No Known Allergies    MEDICATIONS:  Current Outpatient Prescriptions on File Prior to Visit  Medication Sig Dispense Refill  . Fingolimod HCl (GILENYA) 0.5 MG CAPS TAKE 1 CAPSULE (0.5MG  TOTAL) BY MOUTH DAILY 30 capsule 3  . gabapentin (NEURONTIN) 300 MG capsule Take 1 capsule (300 mg total) by mouth 3 (three) times daily. 90 capsule 11   Current Facility-Administered Medications on File Prior to Visit  Medication Dose Route Frequency Provider Last Rate Last Dose  . gadopentetate dimeglumine (MAGNEVIST) injection 20 mL  20 mL Intravenous Once PRN Butch Penny, NP         PAST SURGICAL HISTORY Past Surgical History:  Procedure Laterality Date  . NONE      FAMILY HISTORY: Family History  Problem Relation Age of Onset  . Hypertension Maternal Grandmother   . Diabetes Maternal Grandmother   . Hypertension Paternal Grandmother     SOCIAL HISTORY:  reports that she has never smoked. She has never used smokeless tobacco. She reports that she does not drink alcohol or use drugs.  PERFORMANCE STATUS: The patient's performance status is 1 - Symptomatic but completely ambulatory  PHYSICAL EXAM: Most Recent Vital Signs: Blood pressure 135/89, pulse 69, temperature 97.9 F (36.6 C), temperature source Oral, resp. rate 16, height 5\' 11"  (1.803 m), weight 208 lb (  94.3 kg), SpO2 100 %. BP 135/89 (BP Location: Left Arm, Patient Position: Sitting)   Pulse 69   Temp 97.9 F (36.6 C) (Oral)   Resp 16   Ht 5\' 11"  (1.803 m)   Wt 208 lb (94.3 kg)   SpO2 100%   BMI 29.01 kg/m   General Appearance:    Alert, cooperative, no distress, appears stated age  Head:    Normocephalic, without obvious abnormality, atraumatic  Eyes:    PERRL, conjunctiva/corneas clear, EOM's intact, fundi    benign, both eyes        Throat:   Lips, mucosa, and tongue normal; teeth and gums normal   Neck:   Supple, symmetrical, trachea midline, no adenopathy;    thyroid:  no enlargement/tenderness/nodules; no carotid   bruit or JVD  Back:     Symmetric, no curvature, ROM normal, no CVA tenderness  Lungs:     Clear to auscultation bilaterally, respirations unlabored  Chest Wall:    No tenderness or deformity   Heart:    Regular rate and rhythm, S1 and S2 normal, no murmur, rub   or gallop     Abdomen:     Soft, non-tender, bowel sounds active all four quadrants,    no masses, no organomegaly        Extremities:   Extremities normal, atraumatic, no cyanosis or edema  Pulses:   2+ and symmetric all extremities  Skin:   Skin color, texture, turgor normal, no rashes or lesions  Lymph nodes:   Cervical, supraclavicular, and axillary nodes normal  Neurologic:   CNII-XII intact, normal strength, sensation and reflexes    throughout    LABORATORY DATA:  Results for orders placed or performed in visit on 04/02/17 (from the past 48 hour(s))  CBC w/Diff     Status: Abnormal   Collection Time: 04/02/17 12:56 PM  Result Value Ref Range   WBC 7.4 3.9 - 10.0 10e3/uL   RBC 4.58 3.70 - 5.32 10e6/uL   HGB 10.9 (L) 11.6 - 15.9 g/dL   HCT 46.8 (L) 03.2 - 12.2 %   MCV 69 (L) 81 - 101 fL   MCH 23.8 (L) 26.0 - 34.0 pg   MCHC 34.3 32.0 - 36.0 g/dL   RDW 48.2 50.0 - 37.0 %   Platelets 191 Large platelets present 145 - 400 10e3/uL   NEUT# 6.0 1.5 - 6.5 10e3/uL   LYMPH# 0.7 (L) 0.9 - 3.3 10e3/uL   MONO# 0.6 0.1 - 0.9 10e3/uL   Eosinophils Absolute 0.0 0.0 - 0.5 10e3/uL   BASO# 0.0 0.0 - 0.2 10e3/uL   NEUT% 81.1 (H) 39.6 - 80.0 %   LYMPH% 9.9 (L) 14.0 - 48.0 %   MONO% 8.4 0.0 - 13.0 %   EOS% 0.5 0.0 - 7.0 %   BASO% 0.1 0.0 - 2.0 %  Smear     Status: None   Collection Time: 04/02/17 12:56 PM  Result Value Ref Range   Smear Result Smear Available   TECHNOLOGIST REVIEW [facility]     Status: None   Collection Time: 04/02/17 12:56 PM  Result Value Ref Range   Tech Review few ovalo &  targets       RADIOGRAPHY: No results found.     PATHOLOGY: None  ASSESSMENT/PLAN: Carol [name] is a very pleasant 31 yo African American female with iron deficiency anemia. She is symptomatic with chills and craving ice.  Hgb is 10.9 with an MCV of 69. Ferritin last  month was 8.  She would prefer to try an oral iron supplement first and avoid the IV infusions if possible.  We will have her try taking Fusion plus iron supplement (with vitamin C) and folic acid daily.  We will see her back in 3 months for repeat lab work and follow-up.   All questions were answered and both she and her husband are in agreement with the plan. They will contact our office with any questions or concerns. We can certainly see her much sooner if necessary.  She was discussed with and also seen by Dr. Myna Hidalgo and he is in agreement with the aforementioned.   [facility]      Addendum:  I saw and examined patient with Sarah. I agree with the above assessment.  We will try her on oral iron.  Her iron studies still are quite low. Her ferritin is only 10. Her iron saturation is 6%. Her vitamin B-12 level is 749.  She was not that symptomatic. Hopefully, with oral iron, she will improve.  We will plan to get her back in 3 months. I think this would be very reasonable. If her blood counts are not better in 3 months, then we will definitely need to consider IV iron.  We spent about 45 minutes with her. We answered all of her questions. We reviewed the labs with her.  Christin Bach, MD

## 2017-04-03 LAB — RETICULOCYTES: Reticulocyte Count: 1 % (ref 0.6–2.6)

## 2017-04-03 LAB — VITAMIN B12: Vitamin B12: 749 pg/mL (ref 232–1245)

## 2017-04-03 LAB — FERRITIN: FERRITIN: 10 ng/mL (ref 9–269)

## 2017-04-03 LAB — IRON AND TIBC
%SAT: 6 % — ABNORMAL LOW (ref 21–57)
Iron: 26 ug/dL — ABNORMAL LOW (ref 41–142)
TIBC: 418 ug/dL (ref 236–444)
UIBC: 391 ug/dL — ABNORMAL HIGH (ref 120–384)

## 2017-04-11 LAB — ALPHA-THALASSEMIA GENOTYPR

## 2017-04-15 ENCOUNTER — Encounter: Payer: Self-pay | Admitting: Hematology & Oncology

## 2017-04-18 ENCOUNTER — Encounter: Payer: Self-pay | Admitting: Hematology & Oncology

## 2017-07-03 ENCOUNTER — Ambulatory Visit: Payer: No Typology Code available for payment source | Admitting: Family

## 2017-07-03 ENCOUNTER — Other Ambulatory Visit: Payer: No Typology Code available for payment source

## 2017-07-28 ENCOUNTER — Encounter: Payer: Self-pay | Admitting: Adult Health

## 2017-07-28 ENCOUNTER — Ambulatory Visit (INDEPENDENT_AMBULATORY_CARE_PROVIDER_SITE_OTHER): Payer: 59 | Admitting: Adult Health

## 2017-07-28 VITALS — BP 120/85 | HR 68 | Ht 71.0 in | Wt 214.2 lb

## 2017-07-28 DIAGNOSIS — R200 Anesthesia of skin: Secondary | ICD-10-CM | POA: Diagnosis not present

## 2017-07-28 DIAGNOSIS — G35 Multiple sclerosis: Secondary | ICD-10-CM

## 2017-07-28 DIAGNOSIS — R202 Paresthesia of skin: Secondary | ICD-10-CM | POA: Diagnosis not present

## 2017-07-28 DIAGNOSIS — Z5181 Encounter for therapeutic drug level monitoring: Secondary | ICD-10-CM | POA: Diagnosis not present

## 2017-07-28 MED ORDER — GABAPENTIN 400 MG PO CAPS: 400.0000 mg | ORAL_CAPSULE | Freq: Three times a day (TID) | ORAL | 5 refills | Status: DC

## 2017-07-28 NOTE — Progress Notes (Signed)
I have reviewed and agreed above plan. 

## 2017-07-28 NOTE — Addendum Note (Signed)
Addended by: Enedina Finner on: 07/28/2017 04:18 PM   Modules accepted: Orders

## 2017-07-28 NOTE — Progress Notes (Signed)
PATIENT: Carol [name] DOB: Feb 21, 1986  REASON FOR VISIT: follow up- multiple sclerosis HISTORY FROM: patient  HISTORY OF PRESENT ILLNESS: Today 07/28/17 Carol [name] is a 31 year old female with a history of relapse remitting multiple sclerosis. She returns today for follow-up. She remains on Gilenya and is tolerating it well. She denies any new numbness or weakness. She continues to report numbness in the hands and lower extremity. She states occasionally she'll have burning and tingling pain in the right leg. The symptoms are intermittent and has occurred before. She denies any changes with her gait or balance. Denies any changes with her vision. No change in her mood or behavior. Her last MRI of the brain was in 2017. At that time he did show 2 new lesions however the patient had been off her Gilenya for 5 months. The patient also sees a hematologist for anemia. She denies any new neurological symptoms. She returns today for an evaluation.  HISTORY 08/12/16: Carol [name] is a 31 year old female with a history of relapsed remitting multiple sclerosis. She returns today for follow-up. She is currently on Gilenya and tolerating it well. Denies any new numbness or weakness. Denies any changes with her gait or balance. Denies any changes with her bowels or bladder. Denies any changes with the vision. She continues to use gabapentin for muscle spasms. She reports that this works well for her. She states that she does have some weakness in the right upper thigh but this has been present since her diagnosis. She denies any new neurological symptoms. Her last MRI was in October 2016. She returns today for an evaluation.  HISTORY per Dr. Zannie Cove notes:  Carol [name] is a 31 year old female with a history of relapsing remitting multiple sclerosis.  In March 2013, she developed new-onset right eye pain and blurred vision. She was initially diagnosed with corneal abrasion. She went to the emergency room twice for  this. She then followed up with ophthalmology, who ordered MRI of the brain and orbits. This showed acute right optic neuritis and 7-8 chronic to moderate plaques. She was diagnosed with probable multiple sclerosis  Around beginning of December 2013, patient developed new symptoms of numbness in her bilateral fingertips and bilateral toes, lasted for 2 weeks.  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 Patient also has intermittent muscle spasms and balance difficulty. No family history of MS. MRI brain showed approximately six lesions in the brain which are consistent with multiple sclerosis. there isabnormality in the right optic nerve consistent with multiple sclerosis, see below report. No enhancing lesions are identified. MRI ORBITS: Mild swelling of hyperintensity in the right optic nerve is present. This shows abnormal enhancement with slight stranding in the surrounding orbital fat suggesting acute inflammation. Enhancement is most prominent within the optic canal on the right. Optic chiasm is normal. The left optic nerve is normal. She also complains of right low back pain, radiating pain to right leg, bilateral hands, and feet paresthesia, She was treated with Gilenya since March 2014, tolerated well, but has stopped Gilenya since March 2016 due to insurance reasons. She denies gait difficulty, moderate right side blurry vision at baseline  UPDATE Oct 6th 2016:She was treated at the end of August 2016 with prednisone tapering starting from 60 mg, for her bilateral feet numbness, right hand and arm numbness, which did help her symptoms Medrol pack was called in July 13 2015 for recurrent  symptoms, she has not taken the second round of steroid yet, she complains of right-sided blurry vision, bilateral feet paresthesia, stiffness, right hand numbness, she denies significant gait  difficulty, no bowel bladder incontinence. She has stopped Gilenya since March 2016, MRI of the brain and cervical is pending UPDATE 12/06/2016CMMs. Carol [name], 31 year old female returns for follow-up. She has been previously followed by Dr. Terrace Arabia and Everlene Other nurse practitioner. She was last seen October 6. She had a flare of symptoms in August and also September and received prednisone Dosepaks. She stopped her Gilenya in March 2016 because of insurance reasons and the patient was not aware she can get it through patient assistance. MRI of the brain with and without contrast on 10/17/2016was abnormal 1. There are 3 small DWI hyperintensities in the bilateral parietal regions. 1 of these lesions on the left side is slightly hypointense on ADC map. No abnormal enhancing lesions. May represent acute-subacute demyelinating plaques. 2. Several small periventricular, subcortical, juxtacortical and left posterior midbrain chronic demyelinating plaques.  3. Compared to MRI brain from 01/23/12, there are at least 6 new demyelinating plaques (3 are chronic, 2 are subacute, and 1 may be acute). She received first dose Gilenya 2 weeks ago and has restarted the medication. The numbness in her feet and hands is better but not completely gone she has not had any falls no bowel or bladder incontinence, denies blurred vision or loss of vision. She returns for reevaluation. She continues to work full-time   REVIEW OF SYSTEMS: Out of a complete 14 system review of symptoms, the patient complains only of the following symptoms, and all other reviewed systems are negative.  See HPI  ALLERGIES: No Known Allergies  HOME MEDICATIONS: Outpatient Medications Prior to Visit  Medication Sig Dispense Refill  . Fingolimod HCl (GILENYA) 0.5 MG CAPS TAKE 1 CAPSULE (0.5MG  TOTAL) BY MOUTH DAILY 30 capsule 3  . folic acid (FOLVITE) 1 MG tablet Take 1 tablet (1 mg total) by mouth daily. 30 tablet 6  . gabapentin (NEURONTIN)  300 MG capsule Take 1 capsule (300 mg total) by mouth 3 (three) times daily. 90 capsule 11  . Iron-FA-B Cmp-C-Biot-Probiotic (FUSION PLUS) CAPS Take 1 capsule by mouth daily. 30 capsule 6   Facility-Administered Medications Prior to Visit  Medication Dose Route Frequency Provider Last Rate Last Dose  . gadopentetate dimeglumine (MAGNEVIST) injection 20 mL  20 mL Intravenous Once PRN Butch Penny, NP        PAST MEDICAL HISTORY: Past Medical History:  Diagnosis Date  . Low back pain 01/20/2013  . MS (multiple sclerosis) (HCC)   . Multiple sclerosis (HCC) 01/12/2013    PAST SURGICAL HISTORY: Past Surgical History:  Procedure Laterality Date  . NONE      FAMILY HISTORY: Family History  Problem Relation Age of Onset  . Hypertension Maternal Grandmother   . Diabetes Maternal Grandmother   . Hypertension Paternal Grandmother     SOCIAL HISTORY: Social History   Social History  . Marital status: Married    Spouse name: N/A  . Number of children: 0  . Years of education: 12   Occupational History  .      [employer]   Social History Main Topics  . Smoking status: Never Smoker  . Smokeless tobacco: Never Used  . Alcohol use No  . Drug use: No  . Sexual activity: Yes    Partners: Male    Birth control/ protection: None   Other Topics Concern  .  Not on file   Social History Narrative   Patient is working at [employer] time., lives with her husband, no children.   Patient is right handed.   Patient has a high school education and some college.   Patient does not drink caffeine.      PHYSICAL EXAM  Vitals:   07/28/17 1302  BP: 120/85  Pulse: 68  Weight: 214 lb 3.2 oz (97.2 kg)  Height:  (1.803 m)   Body mass index is 29.87 kg/m.  Generalized: Well developed, in no acute distress   Neurological examination  Mentation: Alert oriented to time, place, history taking. Follows all commands speech and language fluent Cranial nerve  II-XII: Pupils were equal round reactive to light. Extraocular movements were full, visual field were full on confrontational test. Facial sensation and strength were normal. Uvula tongue midline. Head turning and shoulder shrug  were normal and symmetric. Motor: The motor testing reveals 5 over 5 strength of all 4 extremities. Good symmetric motor tone is noted throughout.  Sensory: Sensory testing is intact to soft touch on all 4 extremities. No evidence of extinction is noted.  Coordination: Cerebellar testing reveals good finger-nose-finger and heel-to-shin bilaterally.  Gait and station: Gait is normal. Tandem gait is normal. Romberg is negative. No drift is seen.  Reflexes: Deep tendon reflexes are symmetric and normal bilaterally.   DIAGNOSTIC DATA (LABS, IMAGING, TESTING) - I reviewed patient records, labs, notes, testing and imaging myself where available.  Lab Results  Component Value Date   WBC 7.4 04/02/2017   HGB 10.9 (L) 04/02/2017   HCT 31.8 (L) 04/02/2017   MCV 69 (L) 04/02/2017   PLT 191 Large platelets present 04/02/2017      Component Value Date/Time   NA 137 04/02/2017 1256   K 3.7 04/02/2017 1256   CL 103 04/02/2017 1256   CO2 29 04/02/2017 1256   GLUCOSE 90 04/02/2017 1256   BUN 7 04/02/2017 1256   CREATININE 0.71 04/02/2017 1256   CALCIUM 9.6 04/02/2017 1256   PROT 7.6 04/02/2017 1256   ALBUMIN 4.2 04/02/2017 1256   AST 18 04/02/2017 1256   ALT 20 04/02/2017 1256   ALKPHOS 92 04/02/2017 1256   BILITOT 0.3 04/02/2017 1256   GFRNONAA 114 04/02/2017 1256   GFRAA 131 04/02/2017 1256   Lab Results  Component Value Date   CHOL 159 03/03/2017   HDL 50 (L) 03/03/2017   LDLCALC 96 03/03/2017   TRIG 63 03/03/2017   CHOLHDL 3.2 03/03/2017   Lab Results  Component Value Date   HGBA1C 5.2 03/03/2017   Lab Results  Component Value Date   VITAMINB12 749 04/02/2017   Lab Results  Component Value Date   TSH 0.719 06/13/2015      ASSESSMENT AND  PLAN 31 y.o. year old female  has a past medical history of Low back pain (01/20/2013); MS (multiple sclerosis) (HCC); and Multiple sclerosis (HCC) (01/12/2013). here with:  1. Multiple sclerosis 2. Numbness and tingling  The patient will continue on Gilenya. I will check blood work today. We will also recheck an MRI of the brain with and without contrast to look for progression of MS. The patient does report increase in burning tingling pain in the right leg. We will increase gabapentin to 400 mg 3 times a day. She is advised that if her symptoms worsen or she develops new symptoms she should let us know. She will follow-up in 6 months with Dr. Terrace Arabia.  Butch Penny, MSN, NP-C 07/28/2017, 1:16 PM [facility] [address], [address] [place], [place] [address] [phone]

## 2017-07-28 NOTE — Patient Instructions (Signed)
Your Plan:  Continue Gilenya Blood work today MRI brain If your symptoms worsen or you develop new symptoms please let us know.   Thank you for coming to see us at [facility]. I hope we have been able to provide you high quality care today.  You may receive a patient satisfaction survey over the next few weeks. We would appreciate your feedback and comments so that we may continue to improve ourselves and the health of our patients.  

## 2017-07-29 ENCOUNTER — Telehealth: Payer: Self-pay | Admitting: Adult Health

## 2017-07-29 DIAGNOSIS — G35 Multiple sclerosis: Secondary | ICD-10-CM

## 2017-07-29 LAB — CBC WITH DIFFERENTIAL/PLATELET
BASOS: 0 %
Basophils Absolute: 0 10*3/uL (ref 0.0–0.2)
EOS (ABSOLUTE): 0.1 10*3/uL (ref 0.0–0.4)
Eos: 1 %
Hematocrit: 34.3 % (ref 34.0–46.6)
Hemoglobin: 11.9 g/dL (ref 11.1–15.9)
Immature Grans (Abs): 0 10*3/uL (ref 0.0–0.1)
Immature Granulocytes: 0 %
Lymphocytes Absolute: 0.8 10*3/uL (ref 0.7–3.1)
Lymphs: 11 %
MCH: 24.9 pg — AB (ref 26.6–33.0)
MCHC: 34.7 g/dL (ref 31.5–35.7)
MCV: 72 fL — AB (ref 79–97)
MONOS ABS: 0.6 10*3/uL (ref 0.1–0.9)
Monocytes: 9 %
NEUTROS ABS: 5.5 10*3/uL (ref 1.4–7.0)
NEUTROS PCT: 79 %
PLATELETS: 177 10*3/uL (ref 150–379)
RBC: 4.77 x10E6/uL (ref 3.77–5.28)
RDW: 16 % — AB (ref 12.3–15.4)
WBC: 7 10*3/uL (ref 3.4–10.8)

## 2017-07-29 LAB — COMPREHENSIVE METABOLIC PANEL
A/G RATIO: 1.8 (ref 1.2–2.2)
ALBUMIN: 4.1 g/dL (ref 3.5–5.5)
ALT: 65 IU/L — ABNORMAL HIGH (ref 0–32)
AST: 50 IU/L — AB (ref 0–40)
Alkaline Phosphatase: 126 IU/L — ABNORMAL HIGH (ref 39–117)
BUN / CREAT RATIO: 10 (ref 9–23)
BUN: 7 mg/dL (ref 6–20)
Bilirubin Total: 0.4 mg/dL (ref 0.0–1.2)
CO2: 25 mmol/L (ref 20–29)
Calcium: 9 mg/dL (ref 8.7–10.2)
Chloride: 102 mmol/L (ref 96–106)
Creatinine, Ser: 0.68 mg/dL (ref 0.57–1.00)
GFR, EST AFRICAN AMERICAN: 135 mL/min/{1.73_m2} (ref >59)
GFR, EST NON AFRICAN AMERICAN: 117 mL/min/{1.73_m2} (ref >59)
GLOBULIN, TOTAL: 2.3 g/dL (ref 1.5–4.5)
GLUCOSE: 81 mg/dL (ref 65–99)
POTASSIUM: 3.9 mmol/L (ref 3.5–5.2)
SODIUM: 142 mmol/L (ref 134–144)
Total Protein: 6.4 g/dL (ref 6.0–8.5)

## 2017-07-29 NOTE — Telephone Encounter (Signed)
Pt saw lab results on mychart. Please call to explain.

## 2017-07-30 NOTE — Telephone Encounter (Signed)
Spoke to pt and relayed that per MM/NP her AST/ALT slightly elevated and will recheck in a month.  She would like a reminder call.  She verbalized understanding.

## 2017-07-30 NOTE — Telephone Encounter (Signed)
Just a CMP

## 2017-07-30 NOTE — Telephone Encounter (Signed)
Noted  

## 2017-07-30 NOTE — Addendum Note (Signed)
Addended by: Guy Begin on: 07/30/2017 10:51 AM   Modules accepted: Orders

## 2017-07-30 NOTE — Telephone Encounter (Signed)
-----   Message from Butch Penny, NP sent at 07/29/2017  3:42 PM EDT ----- AST and ALT slightly elevated. We will recheck in 1 month.

## 2017-08-04 ENCOUNTER — Telehealth: Payer: Self-pay

## 2017-08-04 NOTE — Telephone Encounter (Signed)
Patient called inquiring about her FMLA renewal and asked if she could speak with Marcelino Duster. She says that it is no rush and can wait until tomorrow if needed. Patient can be reached at [phone].

## 2017-08-05 NOTE — Telephone Encounter (Addendum)
I received the paperwork today, completed it and placed it in scan box to be processed by medical records.

## 2017-08-05 NOTE — Telephone Encounter (Signed)
Spoke to patient - states she was out of work due to right leg pain and the side effects of her increased dose of gabapentin from 07/25/17 through 07/31/17.  She also has an appt for MRI on 08/13/17.  She is requesting these dates be added under the "single continuous period of time" on her FMLA forms so that they do not take away from her intermittent time allowed.

## 2017-08-07 ENCOUNTER — Telehealth: Payer: Self-pay | Admitting: *Deleted

## 2017-08-07 NOTE — Telephone Encounter (Signed)
Pt [place] ready to be faxed on 08/08/17. Pt will call to make payment.

## 2017-08-13 ENCOUNTER — Ambulatory Visit
Admission: RE | Admit: 2017-08-13 | Discharge: 2017-08-13 | Disposition: A | Discharge status: Home / Self Care | Payer: 59 | Source: Ambulatory Visit | Attending: Adult Health | Admitting: Adult Health

## 2017-08-13 DIAGNOSIS — G35 Multiple sclerosis: Secondary | ICD-10-CM | POA: Diagnosis not present

## 2017-08-13 MED ORDER — GADOBENATE DIMEGLUMINE 529 MG/ML IV SOLN
20.0000 mL | Freq: Once | INTRAVENOUS | Status: AC | PRN
  Administered 2017-08-13: 20 mL via INTRAVENOUS

## 2017-08-18 ENCOUNTER — Telehealth: Payer: Self-pay | Admitting: *Deleted

## 2017-08-18 NOTE — Telephone Encounter (Signed)
Spoke with patient and informed her that her MRI brain showed no change from her previous MRI Nov 2017.  She had no questions, verbalized understanding, appreciation for call.

## 2017-09-24 ENCOUNTER — Telehealth: Payer: Self-pay | Admitting: Neurology

## 2017-09-24 NOTE — Telephone Encounter (Signed)
Called patient and left her a message to call me back its time  renew her Fingolimod HCl (GILENYA) 0.5 MG CAPS for 2019.

## 2017-10-02 NOTE — Telephone Encounter (Signed)
Spoke to Patient and she is going to fax me her enrollment form for 2019 for her Gilenya

## 2017-10-07 NOTE — Telephone Encounter (Signed)
[employer]  Patient assistance program 2019 form has been fax Telephone [phone] - fax [phone] .

## 2017-10-08 ENCOUNTER — Encounter: Payer: Self-pay | Admitting: Obstetrics and Gynecology

## 2017-11-05 NOTE — Telephone Encounter (Signed)
Patient has been approved for Gilenya  For 2019 [employer] 2019 .

## 2017-12-02 ENCOUNTER — Telehealth: Payer: Self-pay | Admitting: Neurology

## 2017-12-02 ENCOUNTER — Other Ambulatory Visit: Payer: Self-pay | Admitting: *Deleted

## 2017-12-02 MED ORDER — FINGOLIMOD HCL 0.5 MG PO CAPS: ORAL_CAPSULE | ORAL | 11 refills | Status: DC

## 2017-12-02 NOTE — Telephone Encounter (Signed)
Rx printed, signed and faxed to Gilenya Go Program for PAP (Fax#[phone]).

## 2017-12-02 NOTE — Telephone Encounter (Signed)
Pt request new RX for Gilenya be sent to [employer] PAP.

## 2017-12-03 NOTE — Telephone Encounter (Signed)
Patient calling requesting a verbal be sent to [facility] for Gilenya. I advised Rx was faxed on 12-02-17 but she states [employer] says it takes a couple of days to fill. Patient is almost out of medication and would like a verbal called to [facility].

## 2017-12-03 NOTE — Telephone Encounter (Signed)
Called Gilenya Go Program and provided verbal prescription for bridge supply of medication while her patient assistance is being processed.  They will overnight the medication to her.  She is aware and will call back if she does not receive her Gilenya.

## 2018-01-07 ENCOUNTER — Telehealth: Payer: Self-pay | Admitting: Adult Health

## 2018-01-07 ENCOUNTER — Encounter (HOSPITAL_COMMUNITY): Payer: Self-pay | Admitting: Emergency Medicine

## 2018-01-07 ENCOUNTER — Emergency Department (HOSPITAL_COMMUNITY)
Admission: EM | Admit: 2018-01-07 | Discharge: 2018-01-07 | Disposition: A | Discharge status: Home / Self Care | Payer: Managed Care, Other (non HMO) | Attending: Emergency Medicine | Admitting: Emergency Medicine

## 2018-01-07 DIAGNOSIS — G35 Multiple sclerosis: Secondary | ICD-10-CM | POA: Insufficient documentation

## 2018-01-07 DIAGNOSIS — R339 Retention of urine, unspecified: Secondary | ICD-10-CM | POA: Diagnosis not present

## 2018-01-07 DIAGNOSIS — Z79899 Other long term (current) drug therapy: Secondary | ICD-10-CM | POA: Diagnosis not present

## 2018-01-07 LAB — URINALYSIS, ROUTINE W REFLEX MICROSCOPIC
Bacteria, UA: NONE SEEN
Bilirubin Urine: NEGATIVE
Glucose, UA: NEGATIVE mg/dL
KETONES UR: NEGATIVE mg/dL
Leukocytes, UA: NEGATIVE
Nitrite: NEGATIVE
PH: 8 (ref 5.0–8.0)
Protein, ur: 30 mg/dL — AB
SPECIFIC GRAVITY, URINE: 1.017 (ref 1.005–1.030)

## 2018-01-07 LAB — PREGNANCY, URINE: PREG TEST UR: NEGATIVE

## 2018-01-07 NOTE — Telephone Encounter (Signed)
Noted  

## 2018-01-07 NOTE — Telephone Encounter (Signed)
Pt states that for a couple of weeks she has had issues peeing.  Pt states it is delayed. Even when she feels the need she often can not.  Please call, pt wants it noted that she is going to work and is asking a detailed message be left.  Pt will have a 15 min break at 2:00 and an hour lunch at 4:00

## 2018-01-07 NOTE — ED Triage Notes (Signed)
Pt reports that she was having urinary retention since last night at 11pm but she was able to urinate when got here (before triage) and able to get a lot of urine out and now reports doesn't have any back pain. Denies any pain with urination.

## 2018-01-07 NOTE — ED Provider Notes (Signed)
[facility]-EMERGENCY DEPT Provider Note   CSN: [id number] Arrival date & time: 01/07/18  1217     History   Chief Complaint Chief Complaint  Patient presents with  . Urinary Retention    HPI Carol [name] is a 32 y.o. female.  Patient is a 32 year old female with a history of MS currently on therapy presenting with 2 weeks of intermittent symptoms of urinary retention.  She states she has had episodes in the past but none lasting this long or this severe.  She states until she had arrived here she had not urinated since 11:00 last night.  When she starts having urinary retention she will develop discomfort in her lower abdomen and some pain in her back bilaterally.  However after being able to urinate a large amount here she feels much better.  She denies any burning, frequency or foul odor.  She has had no vaginal discharge but does state that she is currently on her menses.  She denies fever, leg pain, lower extremity weakness or bowel incontinence.  She currently has no complaints at this time.  She spoke with her neurologist earlier today who recommended she come here because she was unable to get an appointment with them.  She denies any visual changes, headaches.   The history is provided by the patient.    Past Medical History:  Diagnosis Date  . Low back pain 01/20/2013  . MS (multiple sclerosis) (HCC)   . Multiple sclerosis (HCC) 01/12/2013    Patient Active Problem List   Diagnosis Date Noted  . IDA (iron deficiency anemia) 04/02/2017  . Right optic neuritis 07/27/2015  . Numbness and tingling 06/10/2014  . Low back pain 01/20/2013  . Multiple sclerosis (HCC) 01/12/2013    Past Surgical History:  Procedure Laterality Date  . NONE      OB History    Gravida Para Term Preterm AB Living   0 0 0 0 0 0   SAB TAB Ectopic Multiple Live Births   0 0 0 0         Home Medications    Prior to Admission medications   Medication Sig Start Date  End Date Taking? Authorizing Provider  Aspirin-Salicylamide-Caffeine (BC HEADACHE PO) Take 1 packet by mouth daily as needed (headache).   Yes [provider]  Fingolimod HCl (GILENYA) 0.5 MG CAPS TAKE 1 CAPSULE (0.5MG  TOTAL) BY MOUTH DAILY 12/02/17  Yes Levert Feinstein, MD  folic acid (FOLVITE) 1 MG tablet Take 1 tablet (1 mg total) by mouth daily. 04/02/17  Yes [place], Brand Males, NP  gabapentin (NEURONTIN) 400 MG capsule Take 1 capsule (400 mg total) by mouth 3 (three) times daily. 07/28/17  Yes Butch Penny, NP  Iron-FA-B Cmp-C-Biot-Probiotic (FUSION PLUS) CAPS Take 1 capsule by mouth daily. 04/02/17  Yes [place], Brand Males, NP    Family History Family History  Problem Relation Age of Onset  . Hypertension Maternal Grandmother   . Diabetes Maternal Grandmother   . Hypertension Paternal Grandmother     Social History Social History   Tobacco Use  . Smoking status: Never Smoker  . Smokeless tobacco: Never Used  Substance Use Topics  . Alcohol use: No    Alcohol/week: 0.0 oz  . Drug use: No     Allergies   Patient has no known allergies.   Review of Systems Review of Systems  All other systems reviewed and are negative.    Physical Exam Updated Vital Signs BP (!) 132/97 (  BP Location: Left Arm)   Pulse 68   Temp 98.2 F (36.8 C) (Oral)   Resp 18   LMP 01/05/2018   SpO2 100%   Physical Exam  Constitutional: She is oriented to person, place, and time. She appears well-developed and well-nourished. No distress.  HENT:  Head: Normocephalic and atraumatic.  Mouth/Throat: Oropharynx is clear and moist.  Eyes: Conjunctivae and EOM are normal. Pupils are equal, round, and reactive to light.  Neck: Normal range of motion. Neck supple.  Cardiovascular: Normal rate, regular rhythm and intact distal pulses.  No murmur heard. Pulmonary/Chest: Effort normal and breath sounds normal. No respiratory distress. She has no wheezes. She has no rales.  Abdominal: Soft. She  exhibits no distension. There is no tenderness. There is no rebound and no guarding.  No CVA tenderness  Musculoskeletal: Normal range of motion. She exhibits no edema or tenderness.  Neurological: She is alert and oriented to person, place, and time.  5 out of 5 strength bilaterally in upper and lower extremities.  Gait within normal limits.  Skin: Skin is warm and dry. No rash noted. No erythema.  Psychiatric: She has a normal mood and affect. Her behavior is normal.  Nursing note and vitals reviewed.    ED Treatments / Results  Labs (all labs ordered are listed, but only abnormal results are displayed) Labs Reviewed  URINALYSIS, ROUTINE W REFLEX MICROSCOPIC - Abnormal; Notable for the following components:      Result Value   APPearance CLOUDY (*)    Hgb urine dipstick LARGE (*)    Protein, ur 30 (*)    Squamous Epithelial / LPF 0-5 (*)    All other components within normal limits  PREGNANCY, URINE  POC URINE PREG, ED    EKG  EKG Interpretation None       Radiology No results found.  Procedures Procedures (including critical care time)  Medications Ordered in ED Medications - No data to display   Initial Impression / Assessment and Plan / ED Course  I have reviewed the triage vital signs and the nursing notes.  Pertinent labs & imaging results that were available during my care of the patient were reviewed by me and considered in my medical decision making (see chart for details).     Patient presenting today with intermittent symptoms of urinary retention.  She had not urinated since 11 PM last night however upon arrival here she was able to alert urinate a large amount and states she feels much better.  She is denying any infectious symptoms and is currently on her menses.  Feel most likely the patient's symptoms are related to her MS as she has had stuff like this before just never this severe.  Recommend that she speak with her neurologist about further  treatment.  However will ensure no UTI or pregnancy today.  6:01 PM UA neg  For UTI.  UPT is pending.  Low suspicion for preg as pt is currently on menses  Final Clinical Impressions(s) / ED Diagnoses   Final diagnoses:  Urinary retention  MS (multiple sclerosis) [facility])    ED Discharge Orders    None       Gwyneth Sprout, MD 01/07/18 [phone]

## 2018-01-07 NOTE — Telephone Encounter (Signed)
Spoke with patient who denies any signs of infection or UTI. She stated she has not urinated in past 12 hours and has pain in the left side of her back and lower back. She does not have PCP. Patient sounded anxious over the phone and stated she is thinking of going to the hospital.  This RN encouraged her to go to the ED to be evaluated for cause of urinary issues.  Patient stated she is going and will call this office back after she leaves ED.  She verbalized understanding, appreciation of call back.

## 2018-01-13 ENCOUNTER — Encounter (HOSPITAL_COMMUNITY): Payer: Self-pay

## 2018-01-13 ENCOUNTER — Inpatient Hospital Stay (HOSPITAL_COMMUNITY)
Admission: AD | Admit: 2018-01-13 | Discharge: 2018-01-14 | Disposition: A | Discharge status: Home / Self Care | Payer: Managed Care, Other (non HMO) | Source: Ambulatory Visit | Attending: Family Medicine | Admitting: Family Medicine

## 2018-01-13 ENCOUNTER — Other Ambulatory Visit: Payer: Self-pay

## 2018-01-13 DIAGNOSIS — Z7982 Long term (current) use of aspirin: Secondary | ICD-10-CM | POA: Diagnosis not present

## 2018-01-13 DIAGNOSIS — N926 Irregular menstruation, unspecified: Secondary | ICD-10-CM

## 2018-01-13 DIAGNOSIS — N939 Abnormal uterine and vaginal bleeding, unspecified: Secondary | ICD-10-CM | POA: Insufficient documentation

## 2018-01-13 DIAGNOSIS — N760 Acute vaginitis: Secondary | ICD-10-CM | POA: Diagnosis not present

## 2018-01-13 DIAGNOSIS — B9689 Other specified bacterial agents as the cause of diseases classified elsewhere: Secondary | ICD-10-CM | POA: Diagnosis not present

## 2018-01-13 HISTORY — DX: Anemia, unspecified: D64.9

## 2018-01-13 LAB — POCT PREGNANCY, URINE: PREG TEST UR: NEGATIVE

## 2018-01-13 NOTE — MAU Note (Signed)
Pt states that the last day of her menstrual cycle was Thursday 01/08/2018. She states that she saw pink blood on her toilet tissue yesterday and today she has some blood in her underwear.   She is concerned because she states this has never happened to her before.   She states she is having no pain.

## 2018-01-14 DIAGNOSIS — B9689 Other specified bacterial agents as the cause of diseases classified elsewhere: Secondary | ICD-10-CM

## 2018-01-14 DIAGNOSIS — N760 Acute vaginitis: Secondary | ICD-10-CM | POA: Diagnosis not present

## 2018-01-14 LAB — URINALYSIS, ROUTINE W REFLEX MICROSCOPIC
BILIRUBIN URINE: NEGATIVE
Glucose, UA: NEGATIVE mg/dL
KETONES UR: NEGATIVE mg/dL
Nitrite: NEGATIVE
PH: 8 (ref 5.0–8.0)
PROTEIN: NEGATIVE mg/dL
Specific Gravity, Urine: 1.012 (ref 1.005–1.030)

## 2018-01-14 LAB — GC/CHLAMYDIA PROBE AMP (~~LOC~~) NOT AT ARMC
Chlamydia: NEGATIVE
Neisseria Gonorrhea: NEGATIVE

## 2018-01-14 LAB — WET PREP, GENITAL
Sperm: NONE SEEN
Trich, Wet Prep: NONE SEEN
YEAST WET PREP: NONE SEEN

## 2018-01-14 MED ORDER — METRONIDAZOLE 500 MG PO TABS: 500.0000 mg | ORAL_TABLET | Freq: Two times a day (BID) | ORAL | 0 refills | Status: DC

## 2018-01-14 NOTE — MAU Provider Note (Signed)
History     CSN: [id number]  Arrival date and time: 01/13/18 2240   First Provider Initiated Contact with Patient 01/13/18 2359     Chief Complaint  Patient presents with  . Vaginal Bleeding   HPI  Carol [name] is a 32 y.o. G0P0000 non pregnant female who presents with spotting when she wipes. She states she finished her normal period on Thursday and then started seeing spotting yesterday when she wiped. Denies any pain. She reports a history of irregular periods but has never had bleeding this close to her normal period. She has a gyn physician that she sees but cannot remember her name.    OB History    Gravida  0   Para  0   Term  0   Preterm  0   AB  0   Living  0     SAB  0   TAB  0   Ectopic  0   Multiple  0   Live Births              Past Medical History:  Diagnosis Date  . Anemia   . Low back pain 01/20/2013  . MS (multiple sclerosis) (HCC)   . Multiple sclerosis (HCC) 01/12/2013    Past Surgical History:  Procedure Laterality Date  . NONE      Family History  Problem Relation Age of Onset  . Hypertension Maternal Grandmother   . Diabetes Maternal Grandmother   . Hypertension Paternal Grandmother     Social History   Tobacco Use  . Smoking status: Never Smoker  . Smokeless tobacco: Never Used  Substance Use Topics  . Alcohol use: No    Alcohol/week: 0.0 oz  . Drug use: No    Allergies: No Known Allergies  Medications Prior to Admission  Medication Sig Dispense Refill Last Dose  . Fingolimod HCl (GILENYA) 0.5 MG CAPS TAKE 1 CAPSULE (0.5MG  TOTAL) BY MOUTH DAILY 30 capsule 11 01/13/2018 at Unknown time  . folic acid (FOLVITE) 1 MG tablet Take 1 tablet (1 mg total) by mouth daily. 30 tablet 6 Past Week at Unknown time  . gabapentin (NEURONTIN) 400 MG capsule Take 1 capsule (400 mg total) by mouth 3 (three) times daily. 90 capsule 5 01/13/2018 at Unknown time  . Iron-FA-B Cmp-C-Biot-Probiotic (FUSION PLUS) CAPS Take 1 capsule by mouth  daily. 30 capsule 6 Past Week at Unknown time  . Aspirin-Salicylamide-Caffeine (BC HEADACHE PO) Take 1 packet by mouth daily as needed (headache).   More than a month at Unknown time    Review of Systems  Constitutional: Negative.  Negative for fatigue and fever.  HENT: Negative.   Respiratory: Negative.  Negative for shortness of breath.   Cardiovascular: Negative.  Negative for chest pain.  Gastrointestinal: Negative.  Negative for abdominal pain, constipation, diarrhea, nausea and vomiting.  Genitourinary: Positive for vaginal bleeding. Negative for dysuria and vaginal discharge.  Neurological: Negative.  Negative for dizziness and headaches.   Physical Exam   Blood pressure (!) 135/92, pulse 64, temperature 98.4 F (36.9 C), resp. rate 16, weight 194 lb 7 oz (88.2 kg), last menstrual period 01/05/2018, SpO2 100 %.  Physical Exam  Nursing note and vitals reviewed. Constitutional: She is oriented to person, place, and time. She appears well-developed and well-nourished. No distress.  HENT:  Head: Normocephalic.  Eyes: Pupils are equal, round, and reactive to light.  Cardiovascular: Normal rate, regular rhythm and normal heart sounds.  Respiratory: Effort normal and breath  sounds normal. No respiratory distress.  GI: Soft. Bowel sounds are normal. She exhibits no distension. There is no tenderness.  Genitourinary:  Genitourinary Comments: Scant amount of bright red blood on vaginal walls  Neurological: She is alert and oriented to person, place, and time.  Skin: Skin is warm and dry.  Psychiatric: She has a normal mood and affect. Her behavior is normal. Judgment and thought content normal.   Pelvic exam: Cervix pink, visually closed, without lesion, vaginal walls and external genitalia normal Bimanual exam: Cervix 0/long/high, firm, anterior, neg CMT, uterus nontender, nonenlarged, adnexa without tenderness, enlargement, or mass MAU Course  Procedures Results for orders placed  or performed during the hospital encounter of 01/13/18 (from the past 24 hour(s))  Urinalysis, Routine w reflex microscopic     Status: Abnormal   Collection Time: 01/13/18 11:01 PM  Result Value Ref Range   Color, Urine YELLOW YELLOW   APPearance HAZY (A) CLEAR   Specific Gravity, Urine 1.012 1.005 - 1.030   pH 8.0 5.0 - 8.0   Glucose, UA NEGATIVE NEGATIVE mg/dL   Hgb urine dipstick LARGE (A) NEGATIVE   Bilirubin Urine NEGATIVE NEGATIVE   Ketones, ur NEGATIVE NEGATIVE mg/dL   Protein, ur NEGATIVE NEGATIVE mg/dL   Nitrite NEGATIVE NEGATIVE   Leukocytes, UA TRACE (A) NEGATIVE   RBC / HPF 0-5 0 - 5 RBC/hpf   WBC, UA 0-5 0 - 5 WBC/hpf   Bacteria, UA RARE (A) NONE SEEN   Squamous Epithelial / LPF 0-5 (A) NONE SEEN   Mucus PRESENT   Pregnancy, urine POC     Status: None   Collection Time: 01/13/18 11:55 PM  Result Value Ref Range   Preg Test, Ur NEGATIVE NEGATIVE   MDM UA, UPT Wet prep and gc/chlamydia  Assessment and Plan   1. Bacterial vaginosis   2. Irregular menstrual cycle    -Discharge home in stable condition -Rx for metronidazole given to patient. Discussed no alcohol consumption -Reviewed irregular cycles with patient and history. Discussed normalcy of occasional irregular cycles -Patient advised to follow-up with gyn of choice to manage irregular cycles.  -Patient may return to MAU as needed or if her condition were to change or worsen  Rolm Bookbinder CNM 01/14/2018, 12:04 AM

## 2018-01-14 NOTE — Discharge Instructions (Signed)
Bacterial Vaginosis Bacterial vaginosis is a vaginal infection that occurs when the normal balance of bacteria in the vagina is disrupted. It results from an overgrowth of certain bacteria. This is the most common vaginal infection among women ages 15-44. Because bacterial vaginosis increases your risk for STIs (sexually transmitted infections), getting treated can help reduce your risk for chlamydia, gonorrhea, herpes, and HIV (human immunodeficiency virus). Treatment is also important for preventing complications in pregnant women, because this condition can cause an early (premature) delivery. What are the causes? This condition is caused by an increase in harmful bacteria that are normally present in small amounts in the vagina. However, the reason that the condition develops is not fully understood. What increases the risk? The following factors may make you more likely to develop this condition:  Having a new sexual partner or multiple sexual partners.  Having unprotected sex.  Douching.  Having an intrauterine device (IUD).  Smoking.  Drug and alcohol abuse.  Taking certain antibiotic medicines.  Being pregnant.  You cannot get bacterial vaginosis from toilet seats, bedding, swimming pools, or contact with objects around you. What are the signs or symptoms? Symptoms of this condition include:  Grey or white vaginal discharge. The discharge can also be watery or foamy.  A fish-like odor with discharge, especially after sexual intercourse or during menstruation.  Itching in and around the vagina.  Burning or pain with urination.  Some women with bacterial vaginosis have no signs or symptoms. How is this diagnosed? This condition is diagnosed based on:  Your medical history.  A physical exam of the vagina.  Testing a sample of vaginal fluid under a microscope to look for a large amount of bad bacteria or abnormal cells. Your health care provider may use a cotton swab  or a small wooden spatula to collect the sample.  How is this treated? This condition is treated with antibiotics. These may be given as a pill, a vaginal cream, or a medicine that is put into the vagina (suppository). If the condition comes back after treatment, a second round of antibiotics may be needed. Follow these instructions at home: Medicines  Take over-the-counter and prescription medicines only as told by your health care provider.  Take or use your antibiotic as told by your health care provider. Do not stop taking or using the antibiotic even if you start to feel better. General instructions  If you have a female sexual partner, tell her that you have a vaginal infection. She should see her health care provider and be treated if she has symptoms. If you have a female sexual partner, he does not need treatment.  During treatment: ? Avoid sexual activity until you finish treatment. ? Do not douche. ? Avoid alcohol as directed by your health care provider. ? Avoid breastfeeding as directed by your health care provider.  Drink enough water and fluids to keep your urine clear or pale yellow.  Keep the area around your vagina and rectum clean. ? Wash the area daily with warm water. ? Wipe yourself from front to back after using the toilet.  Keep all follow-up visits as told by your health care provider. This is important. How is this prevented?  Do not douche.  Wash the outside of your vagina with warm water only.  Use protection when having sex. This includes latex condoms and dental dams.  Limit how many sexual partners you have. To help prevent bacterial vaginosis, it is best to have sex with just   one partner (monogamous).  Make sure you and your sexual partner are tested for STIs.  Wear cotton or cotton-lined underwear.  Avoid wearing tight pants and pantyhose, especially during summer.  Limit the amount of alcohol that you drink.  Do not use any products that  contain nicotine or tobacco, such as cigarettes and e-cigarettes. If you need help quitting, ask your health care provider.  Do not use illegal drugs. Where to find more information:  Centers for Disease Control and Prevention: www.cdc.gov/std  American Sexual Health Association (ASHA): www.ashastd.org  U.S. Department of Health and Human Services, Office on Women's Health: www.womenshealth.gov/ or https://www.womenshealth.gov/a-z-topics/bacterial-vaginosis Contact a health care provider if:  Your symptoms do not improve, even after treatment.  You have more discharge or pain when urinating.  You have a fever.  You have pain in your abdomen.  You have pain during sex.  You have vaginal bleeding between periods. Summary  Bacterial vaginosis is a vaginal infection that occurs when the normal balance of bacteria in the vagina is disrupted.  Because bacterial vaginosis increases your risk for STIs (sexually transmitted infections), getting treated can help reduce your risk for chlamydia, gonorrhea, herpes, and HIV (human immunodeficiency virus). Treatment is also important for preventing complications in pregnant women, because the condition can cause an early (premature) delivery.  This condition is treated with antibiotic medicines. These may be given as a pill, a vaginal cream, or a medicine that is put into the vagina (suppository). This information is not intended to replace advice given to you by your health care provider. Make sure you discuss any questions you have with your health care provider. Document Released: 10/07/2005 Document Revised: 02/10/2017 Document Reviewed: 06/22/2016 Elsevier Interactive Patient Education  2018 Elsevier Inc.  

## 2018-01-27 ENCOUNTER — Ambulatory Visit: Payer: 59 | Admitting: Neurology

## 2018-01-27 ENCOUNTER — Telehealth: Payer: Self-pay | Admitting: *Deleted

## 2018-01-27 NOTE — Telephone Encounter (Signed)
No showed follow up appointment. 

## 2018-01-28 ENCOUNTER — Encounter: Payer: Self-pay | Admitting: Neurology

## 2018-02-06 ENCOUNTER — Ambulatory Visit (HOSPITAL_COMMUNITY)
Admission: EM | Admit: 2018-02-06 | Discharge: 2018-02-06 | Disposition: A | Discharge status: Home / Self Care | Payer: Managed Care, Other (non HMO) | Attending: Family Medicine | Admitting: Family Medicine

## 2018-02-06 ENCOUNTER — Encounter (HOSPITAL_COMMUNITY): Payer: Self-pay | Admitting: Emergency Medicine

## 2018-02-06 DIAGNOSIS — Z79899 Other long term (current) drug therapy: Secondary | ICD-10-CM | POA: Insufficient documentation

## 2018-02-06 DIAGNOSIS — Z7982 Long term (current) use of aspirin: Secondary | ICD-10-CM | POA: Insufficient documentation

## 2018-02-06 DIAGNOSIS — Z113 Encounter for screening for infections with a predominantly sexual mode of transmission: Secondary | ICD-10-CM | POA: Diagnosis not present

## 2018-02-06 DIAGNOSIS — G35 Multiple sclerosis: Secondary | ICD-10-CM | POA: Insufficient documentation

## 2018-02-06 DIAGNOSIS — Z202 Contact with and (suspected) exposure to infections with a predominantly sexual mode of transmission: Secondary | ICD-10-CM | POA: Diagnosis not present

## 2018-02-06 MED ORDER — METRONIDAZOLE 500 MG PO TABS: 500.0000 mg | ORAL_TABLET | Freq: Two times a day (BID) | ORAL | 0 refills | Status: AC

## 2018-02-06 NOTE — Discharge Instructions (Signed)
I have sent in metronidazole for trichomonas.  Refrain from drinking alcohol from taking. Please refrain from sexual intercourse for 7 days while medicines eliminating infection.   We are testing you for Gonorrhea, Chlamydia, Trichomonas, Yeast and Bacterial Vaginosis. We will call you if anything is positive and let you know if you require any further treatment. Please inform partners of any positive results.   Please return if symptoms not improving with treatment, development of fever, nausea, vomiting, abdominal pain.

## 2018-02-06 NOTE — ED Triage Notes (Signed)
Pt here for STD testing, states "my partner has something" denies symptoms.

## 2018-02-07 NOTE — ED Provider Notes (Signed)
[facility]    CSN: [id number] Arrival date & time: 02/06/18  1218     History   Chief Complaint Chief Complaint  Patient presents with  . Exposure to STD    HPI Carol [name] is a 32 y.o. female presenting today with positive exposure to an STD.  She states that her partner tested positive for trichomonas.  She denies any symptoms of vaginal discharge, pelvic pain, abdominal pain, fever, nausea, vomiting, urinary symptoms of dysuria, increased frequency, urgency.  Last menstrual period was a couple weeks ago.  HPI  Past Medical History:  Diagnosis Date  . Anemia   . Low back pain 01/20/2013  . MS (multiple sclerosis) (HCC)   . Multiple sclerosis (HCC) 01/12/2013    Patient Active Problem List   Diagnosis Date Noted  . IDA (iron deficiency anemia) 04/02/2017  . Right optic neuritis 07/27/2015  . Numbness and tingling 06/10/2014  . Low back pain 01/20/2013  . Multiple sclerosis (HCC) 01/12/2013    Past Surgical History:  Procedure Laterality Date  . NONE      OB History    Gravida  0   Para  0   Term  0   Preterm  0   AB  0   Living  0     SAB  0   TAB  0   Ectopic  0   Multiple  0   Live Births               Home Medications    Prior to Admission medications   Medication Sig Start Date End Date Taking? Authorizing Provider  Aspirin-Salicylamide-Caffeine (BC HEADACHE PO) Take 1 packet by mouth daily as needed (headache).    [provider]  Fingolimod HCl (GILENYA) 0.5 MG CAPS TAKE 1 CAPSULE (0.5MG  TOTAL) BY MOUTH DAILY 12/02/17   Levert Feinstein, MD  folic acid (FOLVITE) 1 MG tablet Take 1 tablet (1 mg total) by mouth daily. 04/02/17   [place], Brand Males, NP  gabapentin (NEURONTIN) 400 MG capsule Take 1 capsule (400 mg total) by mouth 3 (three) times daily. 07/28/17   Butch Penny, NP  Iron-FA-B Cmp-C-Biot-Probiotic (FUSION PLUS) CAPS Take 1 capsule by mouth daily. 04/02/17   [place], Brand Males, NP  metroNIDAZOLE (FLAGYL)  500 MG tablet Take 1 tablet (500 mg total) by mouth 2 (two) times daily for 7 days. 02/06/18 02/13/18  Jammy Stlouis, Junius Creamer, PA-C    Family History Family History  Problem Relation Age of Onset  . Hypertension Maternal Grandmother   . Diabetes Maternal Grandmother   . Hypertension Paternal Grandmother     Social History Social History   Tobacco Use  . Smoking status: Never Smoker  . Smokeless tobacco: Never Used  Substance Use Topics  . Alcohol use: No    Alcohol/week: 0.0 oz  . Drug use: No     Allergies   Patient has no known allergies.   Review of Systems Review of Systems  Constitutional: Negative for fever.  Respiratory: Negative for shortness of breath.   Cardiovascular: Negative for chest pain.  Gastrointestinal: Negative for abdominal pain, diarrhea, nausea and vomiting.  Genitourinary: Negative for dysuria, flank pain, genital sores, hematuria, menstrual problem, vaginal bleeding, vaginal discharge and vaginal pain.  Musculoskeletal: Negative for back pain.  Skin: Negative for rash.  Neurological: Negative for dizziness, light-headedness and headaches.     Physical Exam Triage Vital Signs ED Triage Vitals [02/06/18 1405]  Enc Vitals Group  BP 122/87     Pulse Rate 71     Resp 20     Temp 98.1 F (36.7 C)     Temp src      SpO2 100 %     Weight      Height      Head Circumference      Peak Flow      Pain Score 0     Pain Loc      Pain Edu?      Excl. in GC?    No data found.  Updated Vital Signs BP 122/87   Pulse 71   Temp 98.1 F (36.7 C)   Resp 20   LMP 01/19/2018   SpO2 100%   Visual Acuity Right Eye Distance:   Left Eye Distance:   Bilateral Distance:    Right Eye Near:   Left Eye Near:    Bilateral Near:     Physical Exam  Constitutional: She appears well-developed and well-nourished. No distress.  HENT:  Head: Normocephalic and atraumatic.  Eyes: Conjunctivae are normal.  Neck: Neck supple.  Cardiovascular: Normal rate  and regular rhythm.  No murmur heard. Pulmonary/Chest: Effort normal and breath sounds normal. No respiratory distress.  Abdominal: Soft. There is no tenderness.  Nontender to light and deep palpation.  Musculoskeletal: She exhibits no edema.  Neurological: She is alert.  Skin: Skin is warm and dry.  Psychiatric: She has a normal mood and affect.  Nursing note and vitals reviewed.    UC Treatments / Results  Labs (all labs ordered are listed, but only abnormal results are displayed) Labs Reviewed  URINE CYTOLOGY ANCILLARY ONLY    EKG None Radiology No results found.  Procedures Procedures (including critical care time)  Medications Ordered in UC Medications - No data to display   Initial Impression / Assessment and Plan / UC Course  I have reviewed the triage vital signs and the nursing notes.  Pertinent labs & imaging results that were available during my care of the patient were reviewed by me and considered in my medical decision making (see chart for details).     Patient with positive exposure to trichomonas, will go ahead and begin on metronidazole.  Urine cytology sent to check for other STDs. Discussed strict return precautions. Patient verbalized understanding and is agreeable with plan.   Final Clinical Impressions(s) / UC Diagnoses   Final diagnoses:  STD exposure    ED Discharge Orders        Ordered    metroNIDAZOLE (FLAGYL) 500 MG tablet  2 times daily     02/06/18 1446       Controlled Substance Prescriptions [place] Controlled Substance Registry consulted? Not Applicable   Lew Dawes, [place] 02/07/18 1203

## 2018-02-09 LAB — URINE CYTOLOGY ANCILLARY ONLY
Chlamydia: NEGATIVE
Neisseria Gonorrhea: NEGATIVE
TRICH (WINDOWPATH): POSITIVE — AB

## 2018-02-10 LAB — URINE CYTOLOGY ANCILLARY ONLY
Bacterial vaginitis: NEGATIVE
Candida vaginitis: NEGATIVE

## 2018-02-11 ENCOUNTER — Telehealth (HOSPITAL_COMMUNITY): Payer: Self-pay

## 2018-02-11 NOTE — Telephone Encounter (Signed)
Trichomonas is positive. Rx metronidazole was given at the urgent care visit. Pt contacted and made aware, educated to please refrain from sexual intercourse for 7 days to give the medicine time to work. Sexual partners need to be notified and tested/treated. Condoms may reduce risk of reinfection. Recheck for further evaluation if symptoms are not improving. Answered all questions. 

## 2018-08-29 ENCOUNTER — Other Ambulatory Visit: Payer: Self-pay | Admitting: Adult Health

## 2018-08-29 DIAGNOSIS — R200 Anesthesia of skin: Secondary | ICD-10-CM

## 2018-08-29 DIAGNOSIS — R202 Paresthesia of skin: Principal | ICD-10-CM

## 2018-08-31 ENCOUNTER — Telehealth: Payer: Self-pay | Admitting: Neurology

## 2018-08-31 DIAGNOSIS — R200 Anesthesia of skin: Secondary | ICD-10-CM

## 2018-08-31 DIAGNOSIS — R202 Paresthesia of skin: Principal | ICD-10-CM

## 2018-08-31 MED ORDER — GABAPENTIN 400 MG PO CAPS
400.0000 mg | ORAL_CAPSULE | Freq: Three times a day (TID) | ORAL | 0 refills | Status: DC
Start: 2018-08-31 — End: 2018-09-30

## 2018-08-31 NOTE — Telephone Encounter (Signed)
I spoke to pt and made appt for her Wednesday at 09/02/2018 at  1100 for MS RV. Important to be seen 6-12 mon for MS.  Last seen 07/28/2017. I will refill gabapentin 30 days.

## 2018-08-31 NOTE — Telephone Encounter (Signed)
Patient called stating she needs a refill on the Gabapentin Rx. The pharmacy told us they reached out to [place] for the script but it was denied. Please call and advise.

## 2018-09-01 ENCOUNTER — Ambulatory Visit: Payer: Self-pay | Admitting: Adult Health

## 2018-09-02 ENCOUNTER — Ambulatory Visit: Payer: Self-pay | Admitting: Adult Health

## 2018-09-30 ENCOUNTER — Ambulatory Visit (INDEPENDENT_AMBULATORY_CARE_PROVIDER_SITE_OTHER): Payer: 59 | Admitting: Adult Health

## 2018-09-30 ENCOUNTER — Encounter: Payer: Self-pay | Admitting: Adult Health

## 2018-09-30 VITALS — BP 115/82 | HR 66 | Ht 71.0 in | Wt 214.4 lb

## 2018-09-30 DIAGNOSIS — R202 Paresthesia of skin: Secondary | ICD-10-CM

## 2018-09-30 DIAGNOSIS — R200 Anesthesia of skin: Secondary | ICD-10-CM

## 2018-09-30 DIAGNOSIS — G35 Multiple sclerosis: Secondary | ICD-10-CM | POA: Diagnosis not present

## 2018-09-30 MED ORDER — GABAPENTIN 400 MG PO CAPS: 400.0000 mg | ORAL_CAPSULE | Freq: Three times a day (TID) | ORAL | 11 refills | Status: DC

## 2018-09-30 NOTE — Progress Notes (Signed)
PATIENT: Carol [name] DOB: August 05, 1986  REASON FOR VISIT: follow up HISTORY FROM: patient  HISTORY OF PRESENT ILLNESS: Today 09/30/18:  [name] is a 32 year old female with a history of multiple sclerosis.  She returns today for follow-up.  She continues on Gilenya.  She denies any new numbness or weakness.  She continues to take gabapentin and that works well for her.  Denies any changes with her gait or balance.  No change in her vision.  No change in the bowels or bladder.  Patient reports that she has about 2-3 headaches a month her headaches typically occur on the right side of the head.  She denies photophobia and phonophobia.  Denies nausea and vomiting.  She reports that she typically can take a Goody powder and her headache will resolve instantly.   she returns today for an evaluation.  HISTORY 07/28/17 [name] is a 32 year old female with a history of relapse remitting multiple sclerosis. She returns today for follow-up. She remains on Gilenya and is tolerating it well. She denies any new numbness or weakness. She continues to report numbness in the hands and lower extremity. She states occasionally she'll have burning and tingling pain in the right leg. The symptoms are intermittent and has occurred before. She denies any changes with her gait or balance. Denies any changes with her vision. No change in her mood or behavior. Her last MRI of the brain was in 2017. At that time he did show 2 new lesions however the patient had been off her Gilenya for 5 months. The patient also sees a hematologist for anemia. She denies any new neurological symptoms. She returns today for an evaluation.  REVIEW OF SYSTEMS: Out of a complete 14 system review of symptoms, the patient complains only of the following symptoms, and all other reviewed systems are negative.   See HPI  ALLERGIES: No Known Allergies  HOME MEDICATIONS: Outpatient Medications Prior to Visit  Medication Sig Dispense  Refill  . Aspirin-Salicylamide-Caffeine (BC HEADACHE PO) Take 1 packet by mouth daily as needed (headache).    . Fingolimod HCl (GILENYA) 0.5 MG CAPS TAKE 1 CAPSULE (0.5MG  TOTAL) BY MOUTH DAILY 30 capsule 11  . folic acid (FOLVITE) 1 MG tablet Take 1 tablet (1 mg total) by mouth daily. 30 tablet 6  . gabapentin (NEURONTIN) 400 MG capsule Take 1 capsule (400 mg total) by mouth 3 (three) times daily. 90 capsule 0  . Iron-FA-B Cmp-C-Biot-Probiotic (FUSION PLUS) CAPS Take 1 capsule by mouth daily. 30 capsule 6   Facility-Administered Medications Prior to Visit  Medication Dose Route Frequency Provider Last Rate Last Dose  . gadopentetate dimeglumine (MAGNEVIST) injection 20 mL  20 mL Intravenous Once PRN Butch Penny, NP        PAST MEDICAL HISTORY: Past Medical History:  Diagnosis Date  . Anemia   . Low back pain 01/20/2013  . MS (multiple sclerosis) (HCC)   . Multiple sclerosis (HCC) 01/12/2013    PAST SURGICAL HISTORY: Past Surgical History:  Procedure Laterality Date  . NONE      FAMILY HISTORY: Family History  Problem Relation Age of Onset  . Hypertension Maternal Grandmother   . Diabetes Maternal Grandmother   . Hypertension Paternal Grandmother     SOCIAL HISTORY: Social History   Socioeconomic History  . Marital status: Married    Spouse name: Not on file  . Number of children: 0  . Years of education: 27  . Highest education level: Not on file  Occupational History    Comment: [employer]  Social Needs  . Financial resource strain: Not on file  . Food insecurity:    Worry: Not on file    Inability: Not on file  . Transportation needs:    Medical: Not on file    Non-medical: Not on file  Tobacco Use  . Smoking status: Never Smoker  . Smokeless tobacco: Never Used  Substance and Sexual Activity  . Alcohol use: No    Alcohol/week: 0.0 standard drinks  . Drug use: No  . Sexual activity: Yes    Partners: Male    Birth control/protection: None     Comment: last sex 01-11-18  Lifestyle  . Physical activity:    Days per week: Not on file    Minutes per session: Not on file  . Stress: Not on file  Relationships  . Social connections:    Talks on phone: Not on file    Gets together: Not on file    Attends religious service: Not on file    Active member of club or organization: Not on file    Attends meetings of clubs or organizations: Not on file    Relationship status: Not on file  . Intimate partner violence:    Fear of current or ex partner: Not on file    Emotionally abused: Not on file    Physically abused: Not on file    Forced sexual activity: Not on file  Other Topics Concern  . Not on file  Social History Narrative   Patient is working at [employer] time., lives with her husband, no children.   Patient is right handed.   Patient has a high school education and some college.   Patient does not drink caffeine.      PHYSICAL EXAM  Vitals:   09/30/18 1443  BP: 115/82  Pulse: 66  Weight: 214 lb 6.4 oz (97.3 kg)  Height: 5\' 11"  (1.803 m)   Body mass index is 29.9 kg/m.  Generalized: Well developed, in no acute distress   Neurological examination  Mentation: Alert oriented to time, place, history taking. Follows all commands speech and language fluent Cranial nerve II-XII: Pupils were equal round reactive to light. Extraocular movements were full, visual field were full on confrontational test. Facial sensation and strength were normal. Uvula tongue midline. Head turning and shoulder shrug  were normal and symmetric. Motor: The motor testing reveals 5 over 5 strength of all 4 extremities. Good symmetric motor tone is noted throughout.  Sensory: Sensory testing is intact to soft touch on all 4 extremities. No evidence of extinction is noted.  Coordination: Cerebellar testing reveals good finger-nose-finger and heel-to-shin bilaterally.  Gait and station: Gait is normal.  Reflexes: Deep tendon  reflexes are symmetric and normal bilaterally.   DIAGNOSTIC DATA (LABS, IMAGING, TESTING) - I reviewed patient records, labs, notes, testing and imaging myself where available.  Lab Results  Component Value Date   WBC 7.0 07/28/2017   HGB 11.9 07/28/2017   HCT 34.3 07/28/2017   MCV 72 (L) 07/28/2017   PLT 177 07/28/2017      Component Value Date/Time   NA 142 07/28/2017 1333   K 3.9 07/28/2017 1333   K 3.7 04/02/2017 1256   CL 102 07/28/2017 1333   CL 103 04/02/2017 1256   CO2 25 07/28/2017 1333   CO2 29 04/02/2017 1256   GLUCOSE 81 07/28/2017 1333   BUN 7 07/28/2017 1333  CREATININE 0.68 07/28/2017 1333   CREATININE 0.71 04/02/2017 1256   CALCIUM 9.0 07/28/2017 1333   CALCIUM 9.6 04/02/2017 1256   PROT 6.4 07/28/2017 1333   ALBUMIN 4.1 07/28/2017 1333   ALBUMIN 4.2 04/02/2017 1256   AST 50 (H) 07/28/2017 1333   AST 18 04/02/2017 1256   ALT 65 (H) 07/28/2017 1333   ALT 20 04/02/2017 1256   ALKPHOS 126 (H) 07/28/2017 1333   ALKPHOS 92 04/02/2017 1256   BILITOT 0.4 07/28/2017 1333   GFRNONAA 117 07/28/2017 1333   GFRAA 135 07/28/2017 1333   Lab Results  Component Value Date   CHOL 159 03/03/2017   HDL 50 (L) 03/03/2017   LDLCALC 96 03/03/2017   TRIG 63 03/03/2017   CHOLHDL 3.2 03/03/2017   Lab Results  Component Value Date   HGBA1C 5.2 03/03/2017   Lab Results  Component Value Date   VITAMINB12 749 04/02/2017   Lab Results  Component Value Date   TSH 0.719 06/13/2015      ASSESSMENT AND PLAN 32 y.o. year old female  has a past medical history of Anemia, Low back pain (01/20/2013), MS (multiple sclerosis) (HCC), and Multiple sclerosis (HCC) (01/12/2013). here with:  1: Multiple sclerosis  The patient will continue on Gilenya.  I will check blood work today.  We will repeat an MRI at the next visit.  She is advised that if her symptoms worsen or she develops new symptoms she should let us know.  She is encouraged to continue monitoring her headaches.  If  they increase in frequency she should let us know.  She will follow-up in 6 months or sooner if needed.     Butch Penny, MSN, NP-C 09/30/2018, 3:08 PM [facility] [address], [address] [place], [place] [address] [phone]

## 2018-09-30 NOTE — Progress Notes (Signed)
I have reviewed and agreed above plan. 

## 2018-09-30 NOTE — Patient Instructions (Signed)
Your Plan:  Continue Gilenya Blood work today MRI at the next visit If your symptoms worsen or you develop new symptoms please let us know.   Thank you for coming to see us at [facility]. I hope we have been able to provide you high quality care today.  You may receive a patient satisfaction survey over the next few weeks. We would appreciate your feedback and comments so that we may continue to improve ourselves and the health of our patients.  

## 2018-10-01 ENCOUNTER — Encounter: Payer: Self-pay | Admitting: *Deleted

## 2018-10-01 LAB — COMPREHENSIVE METABOLIC PANEL
A/G RATIO: 1.6 (ref 1.2–2.2)
ALT: 27 IU/L (ref 0–32)
AST: 21 IU/L (ref 0–40)
Albumin: 4.1 g/dL (ref 3.5–5.5)
Alkaline Phosphatase: 117 IU/L (ref 39–117)
BILIRUBIN TOTAL: 0.4 mg/dL (ref 0.0–1.2)
BUN/Creatinine Ratio: 10 (ref 9–23)
BUN: 8 mg/dL (ref 6–20)
CALCIUM: 9.5 mg/dL (ref 8.7–10.2)
CHLORIDE: 103 mmol/L (ref 96–106)
CO2: 26 mmol/L (ref 20–29)
CREATININE: 0.81 mg/dL (ref 0.57–1.00)
GFR calc non Af Amer: 96 mL/min/{1.73_m2} (ref >59)
GFR, EST AFRICAN AMERICAN: 111 mL/min/{1.73_m2} (ref >59)
GLOBULIN, TOTAL: 2.6 g/dL (ref 1.5–4.5)
GLUCOSE: 78 mg/dL (ref 65–99)
Potassium: 4.3 mmol/L (ref 3.5–5.2)
Sodium: 142 mmol/L (ref 134–144)
Total Protein: 6.7 g/dL (ref 6.0–8.5)

## 2018-10-01 LAB — CBC WITH DIFFERENTIAL/PLATELET
BASOS: 1 %
Basophils Absolute: 0 10*3/uL (ref 0.0–0.2)
EOS (ABSOLUTE): 0.1 10*3/uL (ref 0.0–0.4)
EOS: 1 %
HEMATOCRIT: 37.7 % (ref 34.0–46.6)
Hemoglobin: 12.5 g/dL (ref 11.1–15.9)
IMMATURE GRANS (ABS): 0 10*3/uL (ref 0.0–0.1)
Immature Granulocytes: 0 %
LYMPHS ABS: 0.8 10*3/uL (ref 0.7–3.1)
LYMPHS: 13 %
MCH: 25.8 pg — ABNORMAL LOW (ref 26.6–33.0)
MCHC: 33.2 g/dL (ref 31.5–35.7)
MCV: 78 fL — AB (ref 79–97)
MONOCYTES: 12 %
Monocytes Absolute: 0.7 10*3/uL (ref 0.1–0.9)
Neutrophils Absolute: 4.7 10*3/uL (ref 1.4–7.0)
Neutrophils: 73 %
Platelets: 186 10*3/uL (ref 150–450)
RBC: 4.85 x10E6/uL (ref 3.77–5.28)
RDW: 14.8 % (ref 12.3–15.4)
WBC: 6.4 10*3/uL (ref 3.4–10.8)

## 2018-10-06 NOTE — Progress Notes (Signed)
Fax confirmation received for [employer] PAP  For Gilenya [phone], fax [phone]. sy

## 2018-10-19 ENCOUNTER — Telehealth: Payer: Self-pay | Admitting: Neurology

## 2018-10-19 ENCOUNTER — Telehealth: Payer: Self-pay | Admitting: Adult Health

## 2018-10-19 NOTE — Telephone Encounter (Signed)
Patient calling to follow up on patient assistance for Fingolimod HCl (GILENYA) 0.5 MG CAPS.

## 2018-10-19 NOTE — Telephone Encounter (Signed)
Spoke with the patient and she stated it was a renewal for her Gilenya with her patient assistance program. She stated she dropped her paperwork off on 09-30-18 when she saw [facility] for her office visit.

## 2018-10-28 ENCOUNTER — Telehealth: Payer: Self-pay | Admitting: Neurology

## 2018-10-28 NOTE — Telephone Encounter (Signed)
Returned call to the patient.  States she brought her Gilenya patient assistance paperwork to her last appointment with Aundra Millet.  She was denied patient assistance because the paperwork was not received back by the Gilenya Go Program (ph: [phone]).  She needs this paperwork sent to them as soon as possible (time sensitive matter).  She is also asking that our office call the them to make sure this is all they need from [place].  She is concerned about this situation and would like a follow up call back today to update her on the progress.

## 2018-10-28 NOTE — Telephone Encounter (Signed)
Pt is wanting to know why she was denied Pt assistance on her Gilenya. Please advise.

## 2018-10-28 NOTE — Telephone Encounter (Signed)
Refaxed [employer] PAF papers to [employer], received confirmation of successful fax.  Called [employer] PAF [phone], spoke with Legrand Comoazia who stated they have received papers, are now waiting on the benefits investigation outcome. Typically this takes 48-72 hours for outcome. If more information is needed, they reach out. After outcome is reached provider and patient will then receive a letter.  Pharmacy will reach out to patient to schedule a shipment. If the patient has run out of medicaiotn she can call [employer] and request a gratis refill to bridge her until she can get medication shipped.  Meda Coffeehanked Tazia for assistance.  Called patient and left detailed VM informing her of above conversation. Left [employer] PAF number in the event she needs medication.

## 2018-12-08 ENCOUNTER — Telehealth: Payer: Self-pay | Admitting: *Deleted

## 2018-12-08 NOTE — Telephone Encounter (Signed)
Initiated gilenya PA.  CMM Key # [id number].  [employer] Advice worker.  ID [id number] BIN [id number] PCN [id number] GRP [id number].  Determination in 24 hours.

## 2018-12-09 NOTE — Telephone Encounter (Signed)
Received PA approval for gilenya.  12-08-18 thru 12-09-19.   [facility]  PA# [facility] [id number] SP.  Fax [phone].   I faxed to [employer] PAP [phone], received fax confirmation.

## 2018-12-21 NOTE — Telephone Encounter (Signed)
Refaxed PA approval for gilenya to [phone] received fax confirmation. [employer] PAP.

## 2018-12-23 NOTE — Telephone Encounter (Signed)
Received confirmation that approval for gilenya in the NPAF [employer] Patient Assistance foundation.  ID [id number].  REciev until 10-21-19.    [phone].

## 2019-01-07 ENCOUNTER — Emergency Department (HOSPITAL_COMMUNITY)
Admission: EM | Admit: 2019-01-07 | Discharge: 2019-01-07 | Disposition: A | Discharge status: Home / Self Care | Payer: 59 | Attending: Emergency Medicine | Admitting: Emergency Medicine

## 2019-01-07 ENCOUNTER — Emergency Department (HOSPITAL_COMMUNITY): Payer: 59

## 2019-01-07 ENCOUNTER — Other Ambulatory Visit: Payer: Self-pay

## 2019-01-07 DIAGNOSIS — G35 Multiple sclerosis: Secondary | ICD-10-CM | POA: Insufficient documentation

## 2019-01-07 DIAGNOSIS — R0602 Shortness of breath: Secondary | ICD-10-CM | POA: Diagnosis present

## 2019-01-07 DIAGNOSIS — R5383 Other fatigue: Secondary | ICD-10-CM | POA: Insufficient documentation

## 2019-01-07 DIAGNOSIS — Z79899 Other long term (current) drug therapy: Secondary | ICD-10-CM | POA: Insufficient documentation

## 2019-01-07 DIAGNOSIS — J40 Bronchitis, not specified as acute or chronic: Secondary | ICD-10-CM | POA: Insufficient documentation

## 2019-01-07 DIAGNOSIS — R05 Cough: Secondary | ICD-10-CM | POA: Diagnosis not present

## 2019-01-07 DIAGNOSIS — M791 Myalgia, unspecified site: Secondary | ICD-10-CM | POA: Insufficient documentation

## 2019-01-07 DIAGNOSIS — R509 Fever, unspecified: Secondary | ICD-10-CM | POA: Diagnosis not present

## 2019-01-07 LAB — CBC WITH DIFFERENTIAL/PLATELET
ABS IMMATURE GRANULOCYTES: 0.05 10*3/uL (ref 0.00–0.07)
BASOS PCT: 0 %
Basophils Absolute: 0 10*3/uL (ref 0.0–0.1)
EOS PCT: 1 %
Eosinophils Absolute: 0.1 10*3/uL (ref 0.0–0.5)
HCT: 37.1 % (ref 36.0–46.0)
Hemoglobin: 12.6 g/dL (ref 12.0–15.0)
Immature Granulocytes: 1 %
Lymphocytes Relative: 15 %
Lymphs Abs: 1.3 10*3/uL (ref 0.7–4.0)
MCH: 25.6 pg — AB (ref 26.0–34.0)
MCHC: 34 g/dL (ref 30.0–36.0)
MCV: 75.4 fL — ABNORMAL LOW (ref 80.0–100.0)
MONO ABS: 0.6 10*3/uL (ref 0.1–1.0)
MONOS PCT: 6 %
NEUTROS ABS: 7 10*3/uL (ref 1.7–7.7)
Neutrophils Relative %: 77 %
PLATELETS: 132 10*3/uL — AB (ref 150–400)
RBC: 4.92 MIL/uL (ref 3.87–5.11)
RDW: 13.9 % (ref 11.5–15.5)
WBC: 9 10*3/uL (ref 4.0–10.5)
nRBC: 0 % (ref 0.0–0.2)

## 2019-01-07 LAB — I-STAT BETA HCG BLOOD, ED (MC, WL, AP ONLY): I-stat hCG, quantitative: <5 m[IU]/mL (ref <5)

## 2019-01-07 LAB — BASIC METABOLIC PANEL
Anion gap: 11 (ref 5–15)
CO2: 24 mmol/L (ref 22–32)
CREATININE: 0.74 mg/dL (ref 0.44–1.00)
Calcium: 9 mg/dL (ref 8.9–10.3)
Chloride: 103 mmol/L (ref 98–111)
GFR calc Af Amer: >60 mL/min (ref >60)
GLUCOSE: 115 mg/dL — AB (ref 70–99)
POTASSIUM: 3.3 mmol/L — AB (ref 3.5–5.1)
Sodium: 138 mmol/L (ref 135–145)

## 2019-01-07 LAB — LACTIC ACID, PLASMA: Lactic Acid, Venous: 1.5 mmol/L (ref 0.5–1.9)

## 2019-01-07 MED ORDER — HYDROCODONE-HOMATROPINE 5-1.5 MG/5ML PO SYRP: 5.0000 mL | ORAL_SOLUTION | Freq: Four times a day (QID) | ORAL | 0 refills | Status: DC | PRN

## 2019-01-07 MED ORDER — SODIUM CHLORIDE 0.9 % IV SOLN
2.0000 g | Freq: Once | INTRAVENOUS | Status: DC
  Administered 2019-01-07: 2 g via INTRAVENOUS
  Filled 2019-01-07 (×2): qty 20

## 2019-01-07 MED ORDER — AMOXICILLIN-POT CLAVULANATE 875-125 MG PO TABS: 1.0000 | ORAL_TABLET | Freq: Two times a day (BID) | ORAL | 0 refills | Status: DC

## 2019-01-07 MED ORDER — AMOXICILLIN-POT CLAVULANATE 875-125 MG PO TABS: 1.0000 | ORAL_TABLET | Freq: Once | ORAL | Status: DC

## 2019-01-07 MED ORDER — DOXYCYCLINE HYCLATE 100 MG PO TABS
100.0000 mg | ORAL_TABLET | Freq: Once | ORAL | Status: AC
  Administered 2019-01-07: 100 mg via ORAL
  Filled 2019-01-07: qty 1

## 2019-01-07 MED ORDER — ALBUTEROL SULFATE HFA 108 (90 BASE) MCG/ACT IN AERS
2.0000 | INHALATION_SPRAY | Freq: Once | RESPIRATORY_TRACT | Status: AC
  Administered 2019-01-07: 2 via RESPIRATORY_TRACT
  Filled 2019-01-07: qty 6.7

## 2019-01-07 MED ORDER — DOXYCYCLINE HYCLATE 100 MG PO CAPS: 100.0000 mg | ORAL_CAPSULE | Freq: Two times a day (BID) | ORAL | 0 refills | Status: AC

## 2019-01-07 MED ORDER — SODIUM CHLORIDE 0.9 % IV BOLUS
1000.0000 mL | Freq: Once | INTRAVENOUS | Status: DC
  Administered 2019-01-07: 1000 mL via INTRAVENOUS

## 2019-01-07 NOTE — ED Triage Notes (Signed)
Arrived from home with SOB, Seen in Hospital in [place] last week (about 5 days)  Diagnosed with URI and told to take Tylenol and motrin  Pt states they swabed for the flu and all tests were negative dispite 104 F temp.

## 2019-01-07 NOTE — ED Provider Notes (Signed)
[facility] EMERGENCY DEPARTMENT Provider Note   CSN: [id number] Arrival date & time: 01/07/19  6546    History   Chief Complaint Chief Complaint  Patient presents with  . Shortness of Breath    HPI Carol [name] is a 33 y.o. female.     HPI   33 yo F with PMhx MS here with SOB. Pt states that her sx started approx 1.5 week ago. She states she developed cough, body aches, and fatigue w/ fever to 104. She was seen at an ER in [place] and diagnosed with viral syndrome, has been taking tylenol and advil since then. She reports that her fever, generalized illness sx have markedly improved since then, but she has had persistent cough which is why she is here. Denies any fever, chills since last week. No poor appetite. No recent travel outside of the [place]. No h/o lung problems or asthma. No h/o COPD, does not smoke. No other complaints. Cough is worse at night. No alleviating factors.  Past Medical History:  Diagnosis Date  . Anemia   . Low back pain 01/20/2013  . MS (multiple sclerosis) (HCC)   . Multiple sclerosis (HCC) 01/12/2013    Patient Active Problem List   Diagnosis Date Noted  . IDA (iron deficiency anemia) 04/02/2017  . Right optic neuritis 07/27/2015  . Numbness and tingling 06/10/2014  . Low back pain 01/20/2013  . Multiple sclerosis (HCC) 01/12/2013    Past Surgical History:  Procedure Laterality Date  . NONE       OB History    Gravida  0   Para  0   Term  0   Preterm  0   AB  0   Living  0     SAB  0   TAB  0   Ectopic  0   Multiple  0   Live Births               Home Medications    Prior to Admission medications   Medication Sig Start Date End Date Taking? Authorizing Provider  azelastine (ASTELIN) 0.1 % nasal spray Place 1 spray into both nostrils 2 (two) times daily.  12/30/18  Yes [provider]  Fingolimod HCl (GILENYA) 0.5 MG CAPS TAKE 1 CAPSULE (0.5MG  TOTAL) BY MOUTH DAILY 12/02/17  Yes Levert Feinstein, MD  fluticasone (FLONASE) 50 MCG/ACT nasal spray Place 1 spray into both nostrils 2 (two) times daily.  12/30/18  Yes [provider]  gabapentin (NEURONTIN) 400 MG capsule Take 1 capsule (400 mg total) by mouth 3 (three) times daily. 09/30/18  Yes Butch Penny, NP  amoxicillin-clavulanate (AUGMENTIN) 875-125 MG tablet Take 1 tablet by mouth every 12 (twelve) hours for 7 days. 01/07/19 01/14/19  Shaune Pollack, MD  doxycycline (VIBRAMYCIN) 100 MG capsule Take 1 capsule (100 mg total) by mouth 2 (two) times daily for 7 days. 01/07/19 01/14/19  Shaune Pollack, MD  folic acid (FOLVITE) 1 MG tablet Take 1 tablet (1 mg total) by mouth daily. Patient not taking: Reported on 01/07/2019 04/02/17   [place], Brand Males, NP  HYDROcodone-homatropine [facility]) 5-1.5 MG/5ML syrup Take 5 mLs by mouth every 6 (six) hours as needed for cough. 01/07/19   Shaune Pollack, MD  Iron-FA-B Cmp-C-Biot-Probiotic (FUSION PLUS) CAPS Take 1 capsule by mouth daily. Patient not taking: Reported on 01/07/2019 04/02/17   [place], Brand Males, NP    Family History Family History  Problem Relation Age of Onset  . Hypertension Maternal  Grandmother   . Diabetes Maternal Grandmother   . Hypertension Paternal Grandmother     Social History Social History   Tobacco Use  . Smoking status: Never Smoker  . Smokeless tobacco: Never Used  Substance Use Topics  . Alcohol use: No    Alcohol/week: 0.0 standard drinks  . Drug use: No     Allergies   Patient has no known allergies.   Review of Systems Review of Systems  Constitutional: Positive for fatigue. Negative for chills and fever.  HENT: Negative for congestion and rhinorrhea.   Eyes: Negative for visual disturbance.  Respiratory: Positive for cough and shortness of breath. Negative for wheezing.   Cardiovascular: Negative for chest pain and leg swelling.  Gastrointestinal: Negative for abdominal pain, diarrhea, nausea and vomiting.  Genitourinary:  Negative for dysuria and flank pain.  Musculoskeletal: Negative for neck pain and neck stiffness.  Skin: Negative for rash and wound.  Allergic/Immunologic: Negative for immunocompromised state.  Neurological: Negative for syncope, weakness and headaches.  All other systems reviewed and are negative.    Physical Exam Updated Vital Signs BP (!) 141/87 (BP Location: Right Arm)   Pulse 78   Temp 98.7 F (37.1 C) (Oral)   Resp 16   Ht 5\' 11"  (1.803 m)   Wt 102.1 kg   LMP 01/07/2019   SpO2 96%   BMI 31.38 kg/m   Physical Exam Vitals signs and nursing note reviewed.  Constitutional:      General: She is not in acute distress.    Appearance: She is well-developed.  HENT:     Head: Normocephalic and atraumatic.  Eyes:     Conjunctiva/sclera: Conjunctivae normal.  Neck:     Musculoskeletal: Neck supple.  Cardiovascular:     Rate and Rhythm: Normal rate and regular rhythm.     Heart sounds: Normal heart sounds. No murmur. No friction rub.  Pulmonary:     Effort: Pulmonary effort is normal. No respiratory distress.     Breath sounds: Examination of the right-lower field reveals rales. Examination of the left-lower field reveals rales. Wheezing and rales present.     Comments: Normal WOB, speaking in full sentences Abdominal:     General: There is no distension.     Palpations: Abdomen is soft.     Tenderness: There is no abdominal tenderness.  Skin:    General: Skin is warm.     Capillary Refill: Capillary refill takes less than 2 seconds.  Neurological:     Mental Status: She is alert and oriented to person, place, and time.     Motor: No abnormal muscle tone.      ED Treatments / Results  Labs (all labs ordered are listed, but only abnormal results are displayed) Labs Reviewed  LACTIC ACID, PLASMA  CBC WITH DIFFERENTIAL/PLATELET  BASIC METABOLIC PANEL  LACTIC ACID, PLASMA  I-STAT BETA HCG BLOOD, ED ([facility], [facility], [facility] ONLY)    EKG EKG Interpretation  Date/Time:   Thursday January 07 2019 05:37:58 EDT Ventricular Rate:  81 PR Interval:    QRS Duration: 96 QT Interval:  351 QTC Calculation: 408 R Axis:   44 Text Interpretation:  Sinus rhythm Borderline T abnormalities, anterior leads Baseline wander in lead(s) II III aVR aVL aVF V2 V3 V4 V5 V6 No old tracing to compare Confirmed by Shaune Pollack [phone]) on 01/07/2019 6:06:49 AM   Radiology Dg Chest 2 View  Result Date: 01/07/2019 CLINICAL DATA:  Shortness of breath. EXAM: CHEST - 2 VIEW  COMPARISON:  No recent prior. FINDINGS: Mediastinum hilar structures normal. Borderline cardiomegaly. No pulmonary venous congestion. Low lung volumes with bibasilar atelectasis/infiltrates. No pleural effusion or pneumothorax. IMPRESSION: 1.  Low lung volumes with bibasilar atelectasis/infiltrates. 2.  Borderline cardiomegaly.  No pulmonary venous congestion. Electronically Signed   By: Maisie Fus  Register   On: 01/07/2019 06:27    Procedures Procedures (including critical care time)  Medications Ordered in ED Medications  cefTRIAXone (ROCEPHIN) 2 g in sodium chloride 0.9 % 100 mL IVPB (has no administration in time range)  sodium chloride 0.9 % bolus 1,000 mL (has no administration in time range)  amoxicillin-clavulanate (AUGMENTIN) 875-125 MG per tablet 1 tablet (has no administration in time range)  albuterol (PROVENTIL HFA;VENTOLIN HFA) 108 (90 Base) MCG/ACT inhaler 2 puff (2 puffs Inhalation Given 01/07/19 0611)  doxycycline (VIBRA-TABS) tablet 100 mg (100 mg Oral Given 01/07/19 2694)     Initial Impression / Assessment and Plan / ED Course  I have reviewed the triage vital signs and the nursing notes.  Pertinent labs & imaging results that were available during my care of the patient were reviewed by me and considered in my medical decision making (see chart for details).       33 yo F with PMhx as above here with cough. H/o fever 1 week ago, now resolved. Exam, history is c/w likely post-viral cough,  though cannot r/o PNA given that she is immune suppressed. CXR shows atelectasis, no large focal PNA. She is not febrile or hypoxic. No recent tavel or high risk features for coronavirus. Given her immune suppression, feel its pertinent to cover her empirically but feel the risks of hospitalization outweigh benefits given risk of exposure to flu, covid-19. Encouraged fluids, good return precautions. Will also have her self quarantine at home per [facility] guidelines.  Final Clinical Impressions(s) / ED Diagnoses   Final diagnoses:  Bronchitis    ED Discharge Orders         Ordered    amoxicillin-clavulanate (AUGMENTIN) 875-125 MG tablet  Every 12 hours     01/07/19 0704    doxycycline (VIBRAMYCIN) 100 MG capsule  2 times daily     01/07/19 0704    HYDROcodone-homatropine (HYCODAN) 5-1.5 MG/5ML syrup  Every 6 hours PRN     01/07/19 0704           Shaune Pollack, MD 01/07/19 [phone]

## 2019-01-07 NOTE — ED Notes (Signed)
MD Campos d/c'ed prescription for amoxicillin and pt informed of this change due to getting IV Rocephin. Pt verbalizes understanding to only fill doxycyline.

## 2019-01-07 NOTE — Discharge Instructions (Addendum)
Take the antibiotics as prescribed  Drink plenty of fluids     Person Under Monitoring Name: Carol [name]  Location: [address] [place] [address]   Infection Prevention Recommendations for Individuals Confirmed to have, or Being Evaluated for, 2019 Novel Coronavirus (COVID-19) Infection Who Receive Care at Home  Individuals who are confirmed to have, or are being evaluated for, COVID-19 should follow the prevention steps below until a healthcare provider or local or state health department says they can return to normal activities.  Stay home except to get medical care You should restrict activities outside your home, except for getting medical care. Do not go to work, school, or public areas, and do not use public transportation or taxis.  Call ahead before visiting your doctor Before your medical appointment, call the healthcare provider and tell them that you have, or are being evaluated for, COVID-19 infection. This will help the healthcare providers office take steps to keep other people from getting infected. Ask your healthcare provider to call the local or state health department.  Monitor your symptoms Seek prompt medical attention if your illness is worsening (e.g., difficulty breathing). Before going to your medical appointment, call the healthcare provider and tell them that you have, or are being evaluated for, COVID-19 infection. Ask your healthcare provider to call the local or state health department.  Wear a facemask You should wear a facemask that covers your nose and mouth when you are in the same room with other people and when you visit a healthcare provider. People who live with or visit you should also wear a facemask while they are in the same room with you.  Separate yourself from other people in your home As much as possible, you should stay in a different room from other people in your home. Also, you should use a separate bathroom,  if available.  Avoid sharing household items You should not share dishes, drinking glasses, cups, eating utensils, towels, bedding, or other items with other people in your home. After using these items, you should wash them thoroughly with soap and water.  Cover your coughs and sneezes Cover your mouth and nose with a tissue when you cough or sneeze, or you can cough or sneeze into your sleeve. Throw used tissues in a lined trash can, and immediately wash your hands with soap and water for at least 20 seconds or use an alcohol-based hand rub.  Wash your [employer] your hands often and thoroughly with soap and water for at least 20 seconds. You can use an alcohol-based hand sanitizer if soap and water are not available and if your hands are not visibly dirty. Avoid touching your eyes, nose, and mouth with unwashed hands.   Prevention Steps for Caregivers and Household Members of Individuals Confirmed to have, or Being Evaluated for, COVID-19 Infection Being Cared for in the Home  If you live with, or provide care at home for, a person confirmed to have, or being evaluated for, COVID-19 infection please follow these guidelines to prevent infection:  Follow healthcare providers instructions Make sure that you understand and can help the patient follow any healthcare provider instructions for all care.  Provide for the patients basic needs You should help the patient with basic needs in the home and provide support for getting groceries, prescriptions, and other personal needs.  Monitor the patients symptoms If they are getting sicker, call his or her medical provider and tell them that the patient has, or  is being evaluated for, COVID-19 infection. This will help the healthcare providers office take steps to keep other people from getting infected. Ask the healthcare provider to call the local or state health department.  Limit the number of people who have contact with the  patient If possible, have only one caregiver for the patient. Other household members should stay in another home or place of residence. If this is not possible, they should stay in another room, or be separated from the patient as much as possible. Use a separate bathroom, if available. Restrict visitors who do not have an essential need to be in the home.  Keep older adults, very young children, and other sick people away from the patient Keep older adults, very young children, and those who have compromised immune systems or chronic health conditions away from the patient. This includes people with chronic heart, lung, or kidney conditions, diabetes, and cancer.  Ensure good ventilation Make sure that shared spaces in the home have good air flow, such as from an air conditioner or an opened window, weather permitting.  Wash your hands often Wash your hands often and thoroughly with soap and water for at least 20 seconds. You can use an alcohol based hand sanitizer if soap and water are not available and if your hands are not visibly dirty. Avoid touching your eyes, nose, and mouth with unwashed hands. Use disposable paper towels to dry your hands. If not available, use dedicated cloth towels and replace them when they become wet.  Wear a facemask and gloves Wear a disposable facemask at all times in the room and gloves when you touch or have contact with the patients blood, body fluids, and/or secretions or excretions, such as sweat, saliva, sputum, nasal mucus, vomit, urine, or feces.  Ensure the mask fits over your nose and mouth tightly, and do not touch it during use. Throw out disposable facemasks and gloves after using them. Do not reuse. Wash your hands immediately after removing your facemask and gloves. If your personal clothing becomes contaminated, carefully remove clothing and launder. Wash your hands after handling contaminated clothing. Place all used disposable facemasks,  gloves, and other waste in a lined container before disposing them with other household waste. Remove gloves and wash your hands immediately after handling these items.  Do not share dishes, glasses, or other household items with the patient Avoid sharing household items. You should not share dishes, drinking glasses, cups, eating utensils, towels, bedding, or other items with a patient who is confirmed to have, or being evaluated for, COVID-19 infection. After the person uses these items, you should wash them thoroughly with soap and water.  Wash laundry thoroughly Immediately remove and wash clothes or bedding that have blood, body fluids, and/or secretions or excretions, such as sweat, saliva, sputum, nasal mucus, vomit, urine, or feces, on them. Wear gloves when handling laundry from the patient. Read and follow directions on labels of laundry or clothing items and detergent. In general, wash and dry with the warmest temperatures recommended on the label.  Clean all areas the individual has used often Clean all touchable surfaces, such as counters, tabletops, doorknobs, bathroom fixtures, toilets, phones, keyboards, tablets, and bedside tables, every day. Also, clean any surfaces that may have blood, body fluids, and/or secretions or excretions on them. Wear gloves when cleaning surfaces the patient has come in contact with. Use a diluted bleach solution (e.g., dilute bleach with 1 part bleach and 10 parts water) or a household  disinfectant with a label that says EPA-registered for coronaviruses. To make a bleach solution at home, add 1 tablespoon of bleach to 1 quart (4 cups) of water. For a larger supply, add  cup of bleach to 1 gallon (16 cups) of water. Read labels of cleaning products and follow recommendations provided on product labels. Labels contain instructions for safe and effective use of the cleaning product including precautions you should take when applying the product, such as  wearing gloves or eye protection and making sure you have good ventilation during use of the product. Remove gloves and wash hands immediately after cleaning.  Monitor yourself for signs and symptoms of illness Caregivers and household members are considered close contacts, should monitor their health, and will be asked to limit movement outside of the home to the extent possible. Follow the monitoring steps for close contacts listed on the symptom monitoring form.   ? If you have additional questions, contact your local health department or call the epidemiologist on call at [phone] (available 24/7). ? This guidance is subject to change. For the most up-to-date guidance from [facility], please refer to their website: [email or web address]

## 2019-03-16 ENCOUNTER — Encounter: Payer: Self-pay | Admitting: Neurology

## 2019-03-16 ENCOUNTER — Other Ambulatory Visit: Payer: Self-pay

## 2019-03-16 ENCOUNTER — Ambulatory Visit (INDEPENDENT_AMBULATORY_CARE_PROVIDER_SITE_OTHER): Payer: 59 | Admitting: Neurology

## 2019-03-16 DIAGNOSIS — R200 Anesthesia of skin: Secondary | ICD-10-CM

## 2019-03-16 DIAGNOSIS — G35 Multiple sclerosis: Secondary | ICD-10-CM | POA: Diagnosis not present

## 2019-03-16 DIAGNOSIS — R202 Paresthesia of skin: Secondary | ICD-10-CM

## 2019-03-16 MED ORDER — GABAPENTIN 400 MG PO CAPS
400.0000 mg | ORAL_CAPSULE | Freq: Three times a day (TID) | ORAL | 4 refills | Status: DC
Start: 2019-03-16 — End: 2020-01-27

## 2019-03-16 NOTE — Progress Notes (Signed)
PATIENT: Carol [name] DOB: 1986-06-14  REASON FOR VISIT: follow up HISTORY FROM: patient  HISTORY OF PRESENT ILLNESS: Today 03/16/19:   Carol [name] is a 33 year old female with a history of relapse remitting multiple sclerosis.   She was enrolled in [facility], randomized to Gilenya, since March 5th 2014.   In March 2013, she developed new-onset right eye pain and blurred vision. She was initially diagnosed with corneal abrasion. She went to the emergency room twice for this. She then followed up with ophthalmology, who ordered MRI of the brain and orbits. This showed acute right optic neuritis and 7-8 chronic demyelinating plaques. She was diagnosed with probable multiple sclerosis   Around beginning of December 2013, patient developed new symptoms of numbness in her bilateral fingertips and bilateral toes, lasted for 2 weeks.  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 Patient also has intermittent muscle spasms and balance difficulty. No family history of MS.  MRI brain showed approximately six lesions in the brain which are consistent with multiple sclerosis. there is abnormality in the right optic nerve consistent with  multiple sclerosis, see below report.  No enhancing lesions are identified.   MRI ORBITS in April 2013: Mild swelling of hyperintensity in the right optic nerve is present.    Showed abnormal  enhancement with slight stranding in the surrounding orbital fat suggesting acute inflammation.  Enhancement is most prominent within the optic canal on the right.  Optic chiasm is normal. The left optic nerve is normal.       She remains on Gilenya and is tolerating it well. She denies any new numbness or weakness. She continues to report numbness in the hands and lower extremity. She states occasionally she'll have burning and tingling pain in the right leg. The symptoms  are intermittent and has occurred before. She denies any changes with her gait or balance. Denies any changes with her vision. No change in her mood or behavior. Her last MRI of the brain was in 2017. At that time he did show 2 new lesions however the patient had been off her Gilenya for 5 months. The patient also sees a hematologist for anemia. She denies any new neurological symptoms. She returns today for an evaluation.  Virtual Visit via Video  I connected with Carol [name] on 03/16/19 at  by Video and verified that I am speaking with the correct person using two identifiers.   I discussed the limitations, risks, security and privacy concerns of performing an evaluation and management service by video and the availability of in person appointments. I also discussed with the patient that there may be a patient responsible charge related to this service. The patient expressed understanding and agreed to proceed.  History of Present Illness:  There was no significant worsening of her MS symptoms, she continue have intermittent bilateral upper and lower extremity paresthesia, taking gabapentin 400 mg 3 times a day,  She denies visual loss, no significant gait abnormality, has been taking Gilenya since 2014, tolerating it well,  I personally reviewed MRI brain in Oct 2018: Multiple round and ovoid periventricular and subcortical foci of chronic demyelinating plaque, no change compared to scan in November 2019  She presented to emergency room on January 07, 2019 for fever, 104, whole body achy pain, cough, chest x-ray showed low lung volumes with bibasilar atelectasis, infiltrates, patient symptom last for 1 week, now improved, per patient, COVID-19  RNA testing was negative, but I do not find the report  Laboratory evaluations showed hemoglobin of 12.6, MCV of 75.4, platelet was 132, BMP showed potassium of 3.3, glucose was elevated 115,  Observations/Objective: I have reviewed problem lists,  medications, allergies. Awake, alert, oriented to history taking and casual conversation, facial were symmetric, she can move 4 extremities without difficulties, gait was normal, she can walk on her tiptoes, and heels, no difficulty with tandem walking  Assessment and Plan: Relapsing remitting multiple sclerosis  Has been taking Gilenya since 2014, symptoms are stable,   Last MRI of the brain was in 2018 after discussed with patient, we decided to hold off repeating MRI of the brain   Follow Up Instructions:  In 6 months,    I discussed the assessment and treatment plan with the patient. The patient was provided an opportunity to ask questions and all were answered. The patient agreed with the plan and demonstrated an understanding of the instructions.   The patient was advised to call back or seek an in-person evaluation if the symptoms worsen or if the condition fails to improve as anticipated.  I provided 30 minutes of non-face-to-face time during this encounter.   Levert Feinstein, MD

## 2019-04-05 ENCOUNTER — Ambulatory Visit: Payer: 59 | Admitting: Neurology

## 2019-06-04 ENCOUNTER — Other Ambulatory Visit: Payer: Self-pay

## 2019-06-04 DIAGNOSIS — Z20822 Contact with and (suspected) exposure to covid-19: Secondary | ICD-10-CM

## 2019-06-05 LAB — NOVEL CORONAVIRUS, NAA: SARS-CoV-2, NAA: NOT DETECTED

## 2019-07-20 ENCOUNTER — Telehealth: Payer: Self-pay | Admitting: Neurology

## 2019-07-20 NOTE — Progress Notes (Signed)
PATIENT: Carol [name] DOB: Feb 22, 1986  REASON FOR VISIT: follow up HISTORY FROM: patient  HISTORY OF PRESENT ILLNESS: Today 07/21/19  HISTORY  Carol [name] is a 33 year old female with a history of relapse remitting multiple sclerosis.   She was enrolled in [place], randomized to Gilenya, since March 5th 2014.   In March 2013, she developed new-onset right eye pain and blurred vision. She was initially diagnosed with corneal abrasion. She went to the emergency room twice for this. She then followed up with ophthalmology, who ordered MRI of the brain and orbits. This showed acute right optic neuritis and 7-8 chronic demyelinating plaques. She was diagnosed with probable multiple sclerosis   Around beginning of December 2013, patient developed new symptoms of numbness in her bilateral fingertips and bilateral toes, lasted for 2 weeks.  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 Patient also has intermittent muscle spasms and balance difficulty. No family history of MS.  MRI brain showed approximately six lesions in the brain which are consistent with multiple sclerosis. there is abnormality in the right optic nerve consistent with multiple sclerosis, see below report. No enhancing lesions are identified.  MRI ORBITS in April 2013: Mild swelling of hyperintensity in the right optic nerve is present.   Showed abnormal enhancement with slight stranding in the surrounding orbital fat suggesting acute inflammation. Enhancement is most prominent within the optic canal on the right. Optic chiasm is normal. The left optic nerve is normal.    She remains on Gilenya and is tolerating it well. She denies any new numbness or weakness. She continues to report numbness in the hands and lower extremity. She states occasionally she'll have burning and tingling pain in the right leg. The  symptoms are intermittent and has occurred before. She denies any changes with her gait or balance. Denies any changes with her vision. No change in her mood or behavior. Her last MRI of the brain was in 2017. At that time he did show 2 new lesions however the patient had been off her Gilenya for 5 months. The patient also sees a hematologist for anemia. She denies any new neurological symptoms. She returns today for an evaluation.  Update 03/16/2019 YY: There was no significant worsening of her MS symptoms, she continue have intermittent bilateral upper and lower extremity paresthesia, taking gabapentin 400 mg 3 times a day,  She denies visual loss, no significant gait abnormality, has been taking Gilenya since 2014, tolerating it well,  I personally reviewed MRI brain in Oct 2018: Multiple round and ovoid periventricular and subcortical foci of chronic demyelinating plaque, no change compared to scan in November 2019  She presented to emergency room on January 07, 2019 for fever, 104, whole body achy pain, cough, chest x-ray showed low lung volumes with bibasilar atelectasis, infiltrates, patient symptom last for 1 week, now improved, per patient, COVID-19 RNA testing was negative, but I do not find the report  Laboratory evaluations showed hemoglobin of 12.6, MCV of 75.4, platelet was 132, BMP showed potassium of 3.3, glucose was elevated 115,  Update July 21, 2019 SS: Here today complaining of new problem of daily headache x1 week.  She says the pain has been persistent, describes the location as generalized, feels like her head is " booming".  She denies any changes to her vision, or nausea/vomiting.  She says the pain is 8/10.  She will take a [place] powder  tablet with relief.  She has continued to work.  She denies any new numbness or weakness in her arms or legs.  She denies any changes to her bowels or bladder.  She indicates she has been sleeping well.  She wonders if her headache could be  related to the change in weather/allergies.  She does not have history of headache in the past.  She remains on gabapentin for paresthesia in bilateral hands and muscle spasms.  She has not had recent change in gait or falls.  She had a recent eye exam while taking Gilenya and reports it is normal (10 days ago).  REVIEW OF SYSTEMS: Out of a complete 14 system review of symptoms, the patient complains only of the following symptoms, and all other reviewed systems are negative.  Headache  ALLERGIES: No Known Allergies  HOME MEDICATIONS: Outpatient Medications Prior to Visit  Medication Sig Dispense Refill   Fingolimod HCl (GILENYA) 0.5 MG CAPS TAKE 1 CAPSULE (0.5MG  TOTAL) BY MOUTH DAILY 30 capsule 11   gabapentin (NEURONTIN) 400 MG capsule Take 1 capsule (400 mg total) by mouth 3 (three) times daily. 270 capsule 4   azelastine (ASTELIN) 0.1 % nasal spray Place 1 spray into both nostrils 2 (two) times daily.      fluticasone (FLONASE) 50 MCG/ACT nasal spray Place 1 spray into both nostrils 2 (two) times daily.      folic acid (FOLVITE) 1 MG tablet Take 1 tablet (1 mg total) by mouth daily. (Patient not taking: Reported on 01/07/2019) 30 tablet 6   Iron-FA-B Cmp-C-Biot-Probiotic (FUSION PLUS) CAPS Take 1 capsule by mouth daily. (Patient not taking: Reported on 01/07/2019) 30 capsule 6   Facility-Administered Medications Prior to Visit  Medication Dose Route Frequency Provider Last Rate Last Dose   gadopentetate dimeglumine (MAGNEVIST) injection 20 mL  20 mL Intravenous Once PRN Ward Givens, NP        PAST MEDICAL HISTORY: Past Medical History:  Diagnosis Date   Anemia    Low back pain 01/20/2013   MS (multiple sclerosis) ([facility])    Multiple sclerosis ([facility]) 01/12/2013    PAST SURGICAL HISTORY: Past Surgical History:  Procedure Laterality Date   NONE      FAMILY HISTORY: Family History  Problem Relation Age of Onset   Hypertension Maternal Grandmother    Diabetes  Maternal Grandmother    Hypertension Paternal Grandmother     SOCIAL HISTORY: Social History   Socioeconomic History   Marital status: Married    Spouse name: Not on file   Number of children: 0   Years of education: 12   Highest education level: Not on file  Occupational History    Comment: [employer]  Social Needs   Financial resource strain: Not on file   Food insecurity    Worry: Not on file    Inability: Not on file   Transportation needs    Medical: Not on file    Non-medical: Not on file  Tobacco Use   Smoking status: Never Smoker   Smokeless tobacco: Never Used  Substance and Sexual Activity   Alcohol use: No    Alcohol/week: 0.0 standard drinks   Drug use: No   Sexual activity: Yes    Partners: Male    Birth control/protection: None    Comment: last sex 01-11-18  Lifestyle   Physical activity    Days per week: Not on file    Minutes per session: Not on file   Stress: Not on  file  Relationships   Social connections    Talks on phone: Not on file    Gets together: Not on file    Attends religious service: Not on file    Active member of club or organization: Not on file    Attends meetings of clubs or organizations: Not on file    Relationship status: Not on file   Intimate partner violence    Fear of current or ex partner: Not on file    Emotionally abused: Not on file    Physically abused: Not on file    Forced sexual activity: Not on file  Other Topics Concern   Not on file  Social History Narrative   Patient is working at [employer] Full time., lives with her husband, no children.   Patient is right handed.   Patient has a high school education and some college.   Patient does not drink caffeine.      PHYSICAL EXAM  Vitals:   07/21/19 1530  BP: 119/77  Pulse: 74  Temp: 98.2 F (36.8 C)  TempSrc: Oral  Weight: 238 lb 12.8 oz (108.3 kg)  Height: 5\' 11"  (1.803 m)   Body mass index is 33.31  kg/m.  Generalized: Well developed, in no acute distress   Neurological examination  Mentation: Alert oriented to time, place, history taking. Follows all commands speech and language fluent Cranial nerve II-XII: Pupils were equal round reactive to light. Extraocular movements were full, visual field were full on confrontational test. Facial sensation and strength were normal.  Head turning and shoulder shrug  were normal and symmetric. Motor: The motor testing reveals 5 over 5 strength of all 4 extremities. Good symmetric motor tone is noted throughout.  Sensory: Sensory testing is intact to soft touch on all 4 extremities. No evidence of extinction is noted.  Coordination: Cerebellar testing reveals good finger-nose-finger and heel-to-shin bilaterally.  Gait and station: Gait is normal. Tandem gait is normal. Romberg is negative. No drift is seen.  Reflexes: Deep tendon reflexes are symmetric and normal bilaterally.   DIAGNOSTIC DATA (LABS, IMAGING, TESTING) - I reviewed patient records, labs, notes, testing and imaging myself where available.  Lab Results  Component Value Date   WBC 9.0 01/07/2019   HGB 12.6 01/07/2019   HCT 37.1 01/07/2019   MCV 75.4 (L) 01/07/2019   PLT 132 (L) 01/07/2019      Component Value Date/Time   NA 138 01/07/2019 0644   NA 142 09/30/2018 1528   K 3.3 (L) 01/07/2019 0644   K 3.7 04/02/2017 1256   CL 103 01/07/2019 0644   CL 103 04/02/2017 1256   CO2 24 01/07/2019 0644   CO2 29 04/02/2017 1256   GLUCOSE 115 (H) 01/07/2019 0644   BUN <5 (L) 01/07/2019 0644   BUN 8 09/30/2018 1528   CREATININE 0.74 01/07/2019 0644   CREATININE 0.71 04/02/2017 1256   CALCIUM 9.0 01/07/2019 0644   CALCIUM 9.6 04/02/2017 1256   PROT 6.7 09/30/2018 1528   ALBUMIN 4.1 09/30/2018 1528   ALBUMIN 4.2 04/02/2017 1256   AST 21 09/30/2018 1528   AST 18 04/02/2017 1256   ALT 27 09/30/2018 1528   ALT 20 04/02/2017 1256   ALKPHOS 117 09/30/2018 1528   ALKPHOS 92  04/02/2017 1256   BILITOT 0.4 09/30/2018 1528   GFRNONAA >60 01/07/2019 0644   GFRAA >60 01/07/2019 0644   Lab Results  Component Value Date   CHOL 159 03/03/2017   HDL 50 (  L) 03/03/2017   LDLCALC 96 03/03/2017   TRIG 63 03/03/2017   CHOLHDL 3.2 03/03/2017   Lab Results  Component Value Date   HGBA1C 5.2 03/03/2017   Lab Results  Component Value Date   VITAMINB12 749 04/02/2017   Lab Results  Component Value Date   TSH 0.719 06/13/2015    ASSESSMENT AND PLAN 33 y.o. year old female  has a past medical history of Anemia, Low back pain (01/20/2013), MS (multiple sclerosis) ([facility]), and Multiple sclerosis ([facility]) (01/12/2013). here with:  1.  Multiple sclerosis -Continue Gilenya -Check routine lab work while on Gilenya -She reports recent eye exam 10 days ago was normal  2.  Headache, reporting new problem of daily headache x 7 days, no history of headache -Her clinical exam is unremarkable, no abnormalities, no visual disturbance -Her headache is generalized, is relieved by taking [place] powder, has not impacted her daily function -Could be related to weather change, allergies, unable to identify any other changes or triggers -We will proceed with MRI of the brain with and without contrast -If headache continues she will let me know, no migraine features -Follow-up in 6 months or sooner if needed  I spent 25 minutes with the patient. 50% of this time was spent discussing her plan of care.   Margie Ege, AGNP-C, DNP 07/21/2019, 3:41 PM [facility] [address], [address] [place], [place] [address] [phone]

## 2019-07-20 NOTE — Telephone Encounter (Signed)
Pt was given an appt 10/19 but pt wants RN to call her back to see if she can be put in sooner. Please advise.

## 2019-07-20 NOTE — Telephone Encounter (Signed)
The patient has a follow up scheduled with Ward Givens, NP on 08/09/2019.  She would like to be seen sooner.  She has noticed a recent increase in headaches.  No other concerns with her MS.  She would also like to discuss having a repeat brain MRI.  Her last scan was in 07/2017.  Butler Denmark, NP had an opening on 07/22/2019 and the patient has been moved to her schedule.  She will arrive at 3:15pm for check-in of her 3:45pm appt.

## 2019-07-21 ENCOUNTER — Other Ambulatory Visit: Payer: Self-pay

## 2019-07-21 ENCOUNTER — Encounter: Payer: Self-pay | Admitting: Neurology

## 2019-07-21 ENCOUNTER — Ambulatory Visit (INDEPENDENT_AMBULATORY_CARE_PROVIDER_SITE_OTHER): Payer: 59 | Admitting: Neurology

## 2019-07-21 VITALS — BP 119/77 | HR 74 | Temp 98.2°F | Ht 71.0 in | Wt 238.8 lb

## 2019-07-21 DIAGNOSIS — G35 Multiple sclerosis: Secondary | ICD-10-CM | POA: Diagnosis not present

## 2019-07-21 DIAGNOSIS — R51 Headache: Secondary | ICD-10-CM | POA: Diagnosis not present

## 2019-07-21 DIAGNOSIS — R519 Headache, unspecified: Secondary | ICD-10-CM | POA: Insufficient documentation

## 2019-07-21 NOTE — Patient Instructions (Signed)
1. I will order MRI of the brain  2. I will check lab work today 3. Continue Gilenya

## 2019-07-21 NOTE — Progress Notes (Signed)
I have reviewed and agreed above plan. 

## 2019-07-22 ENCOUNTER — Other Ambulatory Visit: Payer: Self-pay | Admitting: Neurology

## 2019-07-22 ENCOUNTER — Telehealth: Payer: Self-pay | Admitting: Neurology

## 2019-07-22 ENCOUNTER — Telehealth: Payer: Self-pay | Admitting: *Deleted

## 2019-07-22 DIAGNOSIS — G35 Multiple sclerosis: Secondary | ICD-10-CM

## 2019-07-22 LAB — CBC WITH DIFFERENTIAL/PLATELET
Basophils Absolute: 0 10*3/uL (ref 0.0–0.2)
Basos: 0 %
EOS (ABSOLUTE): 0.2 10*3/uL (ref 0.0–0.4)
Eos: 2 %
Hematocrit: 34.7 % (ref 34.0–46.6)
Hemoglobin: 11.7 g/dL (ref 11.1–15.9)
Immature Grans (Abs): 0 10*3/uL (ref 0.0–0.1)
Immature Granulocytes: 0 %
Lymphocytes Absolute: 0.9 10*3/uL (ref 0.7–3.1)
Lymphs: 10 %
MCH: 24.9 pg — ABNORMAL LOW (ref 26.6–33.0)
MCHC: 33.7 g/dL (ref 31.5–35.7)
MCV: 74 fL — ABNORMAL LOW (ref 79–97)
Monocytes Absolute: 0.9 10*3/uL (ref 0.1–0.9)
Monocytes: 10 %
Neutrophils Absolute: 6.9 10*3/uL (ref 1.4–7.0)
Neutrophils: 78 %
Platelets: 216 10*3/uL (ref 150–450)
RBC: 4.69 x10E6/uL (ref 3.77–5.28)
RDW: 15.7 % — ABNORMAL HIGH (ref 11.7–15.4)
WBC: 9 10*3/uL (ref 3.4–10.8)

## 2019-07-22 LAB — COMPREHENSIVE METABOLIC PANEL
ALT: 32 IU/L (ref 0–32)
AST: 24 IU/L (ref 0–40)
Albumin/Globulin Ratio: 1.3 (ref 1.2–2.2)
Albumin: 3.8 g/dL (ref 3.8–4.8)
Alkaline Phosphatase: 161 IU/L — ABNORMAL HIGH (ref 39–117)
BUN/Creatinine Ratio: 13 (ref 9–23)
BUN: 9 mg/dL (ref 6–20)
Bilirubin Total: 0.2 mg/dL (ref 0.0–1.2)
CO2: 23 mmol/L (ref 20–29)
Calcium: 9.1 mg/dL (ref 8.7–10.2)
Chloride: 104 mmol/L (ref 96–106)
Creatinine, Ser: 0.69 mg/dL (ref 0.57–1.00)
GFR calc Af Amer: 132 mL/min/{1.73_m2} (ref >59)
GFR calc non Af Amer: 115 mL/min/{1.73_m2} (ref >59)
Globulin, Total: 2.9 g/dL (ref 1.5–4.5)
Glucose: 96 mg/dL (ref 65–99)
Potassium: 3.9 mmol/L (ref 3.5–5.2)
Sodium: 141 mmol/L (ref 134–144)
Total Protein: 6.7 g/dL (ref 6.0–8.5)

## 2019-07-22 NOTE — Telephone Encounter (Signed)
Spoke with patient and informed her that her labs look stable, with the exception of elevated alkaline phosphatase (liver enzyme) for unknown reason. The NP would like to recheck this in 6 weeks and has placed the order and a reminder. I advised her no appointment needed, come in during regular office hours. She asked what the elevated value meant; I advised it may be an incidental finding, no significance. But the NP will recheck to be sure it comes back down within normal range. She had no other questions,  verbalized understanding, appreciation.

## 2019-07-22 NOTE — Telephone Encounter (Signed)
[employer] order sent to [facility]. They will obtain the auth and reach out to the patient to schedule.  

## 2019-07-22 NOTE — Progress Notes (Signed)
I have placed the order for repeat CP in 6 weeks for elevated alkaline phosphatase.

## 2019-07-22 NOTE — Telephone Encounter (Signed)
error 

## 2019-08-09 ENCOUNTER — Ambulatory Visit: Payer: 59 | Admitting: Adult Health

## 2019-08-11 ENCOUNTER — Telehealth: Payer: Self-pay | Admitting: Neurology

## 2019-08-11 NOTE — Telephone Encounter (Signed)
I returned the call to the patient.  Stated her employer could not find her FMLA ppw but the problem has now been resolved.  They have located it.  She appreciated the call back.

## 2019-08-11 NOTE — Telephone Encounter (Signed)
Pt called needing to ask RN some questions she was told to ask for her FMLA. Please advise.

## 2019-08-16 NOTE — Telephone Encounter (Signed)
Carol [name]: [id number] (exp. 08/12/19 to 02/08/20) patient is scheduled at [facility] for 08/17/19.

## 2019-08-17 ENCOUNTER — Other Ambulatory Visit: Payer: Self-pay

## 2019-08-17 ENCOUNTER — Ambulatory Visit
Admission: RE | Admit: 2019-08-17 | Discharge: 2019-08-17 | Disposition: A | Discharge status: Home / Self Care | Payer: 59 | Source: Ambulatory Visit | Attending: Neurology | Admitting: Neurology

## 2019-08-17 DIAGNOSIS — G35 Multiple sclerosis: Secondary | ICD-10-CM

## 2019-08-17 MED ORDER — GADOBENATE DIMEGLUMINE 529 MG/ML IV SOLN
20.0000 mL | Freq: Once | INTRAVENOUS | Status: AC | PRN
  Administered 2019-08-17: 15:00:00 20 mL via INTRAVENOUS

## 2019-08-19 ENCOUNTER — Telehealth: Payer: Self-pay | Admitting: Neurology

## 2019-08-19 NOTE — Telephone Encounter (Signed)
Called the patient to discuss MRI findings. There was no answer. LVM for the pt to call back.   **Please inform the patient when she calls back that MRI didn't show any new changes from previous MRI in 2018.

## 2019-08-19 NOTE — Telephone Encounter (Signed)
Pt returned call and was informed. Pt stated "Perfect that is great news, thank you."

## 2019-08-19 NOTE — Telephone Encounter (Signed)
-----   Message from Suzzanne Cloud, NP sent at 08/19/2019  8:00 AM EDT ----- Please call the patient. MRI of the brain did not show any acute findings. No change from prior in 2018.   IMPRESSION:   MRI brain (with and without) demonstrating: - Multiple round and ovoid periventricular and subcortical and juxtacortical chronic demyelinating plaques. No abnormal lesions are seen on post contrast views. - No acute plaques. No change from MRI 08/13/17.

## 2019-08-30 ENCOUNTER — Telehealth: Payer: Self-pay | Admitting: Neurology

## 2019-08-30 NOTE — Telephone Encounter (Signed)
Please remind her, time to come in for repeat CMP. Order has been placed.

## 2019-09-13 ENCOUNTER — Other Ambulatory Visit: Payer: Self-pay

## 2019-09-13 ENCOUNTER — Other Ambulatory Visit (INDEPENDENT_AMBULATORY_CARE_PROVIDER_SITE_OTHER): Payer: Self-pay

## 2019-09-13 DIAGNOSIS — Z0289 Encounter for other administrative examinations: Secondary | ICD-10-CM

## 2019-09-13 DIAGNOSIS — G35 Multiple sclerosis: Secondary | ICD-10-CM

## 2019-09-14 ENCOUNTER — Telehealth: Payer: Self-pay | Admitting: *Deleted

## 2019-09-14 LAB — COMPREHENSIVE METABOLIC PANEL
ALT: 35 IU/L — ABNORMAL HIGH (ref 0–32)
AST: 33 IU/L (ref 0–40)
Albumin/Globulin Ratio: 1.6 (ref 1.2–2.2)
Albumin: 4.1 g/dL (ref 3.8–4.8)
Alkaline Phosphatase: 140 IU/L — ABNORMAL HIGH (ref 39–117)
BUN/Creatinine Ratio: 14 (ref 9–23)
BUN: 9 mg/dL (ref 6–20)
Bilirubin Total: 0.2 mg/dL (ref 0.0–1.2)
CO2: 25 mmol/L (ref 20–29)
Calcium: 9.3 mg/dL (ref 8.7–10.2)
Chloride: 104 mmol/L (ref 96–106)
Creatinine, Ser: 0.65 mg/dL (ref 0.57–1.00)
GFR calc Af Amer: 135 mL/min/{1.73_m2} (ref >59)
GFR calc non Af Amer: 117 mL/min/{1.73_m2} (ref >59)
Globulin, Total: 2.5 g/dL (ref 1.5–4.5)
Glucose: 99 mg/dL (ref 65–99)
Potassium: 3.9 mmol/L (ref 3.5–5.2)
Sodium: 141 mmol/L (ref 134–144)
Total Protein: 6.6 g/dL (ref 6.0–8.5)

## 2019-09-14 NOTE — Telephone Encounter (Signed)
-----   Message from Suzzanne Cloud, NP sent at 09/14/2019 11:54 AM EST ----- Please call the patient. Alkaline phosphatase is improving from prior. She may continue on Gilenya. I will follow this overtime. Have her be mindful to limit tylenol, alcohol.

## 2019-09-14 NOTE — Telephone Encounter (Signed)
I called pt and relayed that her labs results (ALK PHOS improved from previous.  Continue on gilenya and will follow over time.  She will limit tylenol and alcohol use.  She verbalized understanding.

## 2019-09-15 ENCOUNTER — Telehealth: Payer: Self-pay | Admitting: *Deleted

## 2019-09-15 NOTE — Telephone Encounter (Addendum)
I called pt and relayed that received from [employer] PAP form to be filled out for next yr for gilenya.  Our part filled out and signed.  Pt will be mailed her part to do then she will fax or mail to [employer]. A precription will be sent in once approved.  Placed in mail today. I have copy.

## 2019-09-30 NOTE — Telephone Encounter (Signed)
Fax confirmation received [phone].

## 2019-09-30 NOTE — Telephone Encounter (Signed)
Received prescription form for [facility] by [employer].  [phone], fax [phone].  To be signed and faxed back.

## 2019-10-06 NOTE — Telephone Encounter (Signed)
Received request from [facility] by [employer] again for [employer].  I refaxed what I did on 09-30-19.

## 2019-10-24 ENCOUNTER — Other Ambulatory Visit: Payer: Self-pay | Admitting: Adult Health

## 2019-10-24 DIAGNOSIS — R200 Anesthesia of skin: Secondary | ICD-10-CM

## 2019-10-24 DIAGNOSIS — R202 Paresthesia of skin: Secondary | ICD-10-CM

## 2019-10-26 ENCOUNTER — Other Ambulatory Visit: Payer: 59

## 2019-11-03 ENCOUNTER — Encounter: Payer: Self-pay | Admitting: Neurology

## 2019-11-03 NOTE — Telephone Encounter (Signed)
FMLA form received

## 2019-11-03 NOTE — Telephone Encounter (Signed)
I called pt after receiving from [employer] fax about incomplete application.  I relayed to her that they are missing her insurance information and income information.  She will call them and see what they need.  She also then had question about FMLA, will mychart over as attachments to be filled out.

## 2019-11-10 NOTE — Telephone Encounter (Signed)
FMLA completed and signed.  To MR.  

## 2019-11-30 ENCOUNTER — Telehealth: Payer: Self-pay | Admitting: *Deleted

## 2019-11-30 NOTE — Telephone Encounter (Signed)
Received approval for Gilenya, [employer] PAF until 21-31-21. [phone].

## 2019-12-30 ENCOUNTER — Telehealth: Payer: Self-pay | Admitting: Neurology

## 2019-12-30 MED ORDER — GILENYA 0.5 MG PO CAPS: ORAL_CAPSULE | ORAL | 11 refills | Status: DC

## 2019-12-30 NOTE — Telephone Encounter (Signed)
I called pt and she states that need [place] to call and relay refills for her gilenya. I called Gilenya PAP [phone].  I spoke to [place].  Last refill was sent pt to receive by 01-02-21, I escribed the  [facility] for gilenya 0.5mg  po daily #30 with 11 refills.

## 2019-12-30 NOTE — Telephone Encounter (Signed)
Pt called needing to speak to the RN about her Fingolimod HCl (GILENYA) 0.5 MG CAPS Pharmacy informed her that on the form did not state how many refills she is to get and now they are not wanting to give her the medication. Please advise.

## 2020-01-17 ENCOUNTER — Telehealth: Payer: Self-pay | Admitting: Neurology

## 2020-01-17 NOTE — Telephone Encounter (Signed)
Pt called wanting to speak to RN about some concerns she has on her FMLA paperwork and she would like to be advised.

## 2020-01-17 NOTE — Telephone Encounter (Signed)
Spoke to pt and she will need to have start date to be 09-15-2019.  Addended and fax confirmation received [facility] [employer] [phone].  To MR (copy).

## 2020-01-17 NOTE — Telephone Encounter (Signed)
I called pt and she stated that the form date ended 09-14-18, and needed new start date from 09-15-19. Change made and will need to be initialed and dated and then faxed back.

## 2020-01-18 ENCOUNTER — Telehealth: Payer: Self-pay | Admitting: *Deleted

## 2020-01-19 ENCOUNTER — Ambulatory Visit: Payer: 59 | Admitting: Neurology

## 2020-01-20 NOTE — Telephone Encounter (Signed)
Appt scheduled 01/27/20.

## 2020-01-24 NOTE — Progress Notes (Deleted)
34 y.o. G0P0000 Married Black or [place] American Not Hispanic or Latino female here for annual exam.      No LMP recorded.          Sexually active: {yes no:314532}  The current method of family planning is {contraception:315051}.    Exercising: {yes no:314532}  {types:19826} Smoker:  {YES NO:22349}  Health Maintenance: Pap:  02-29-16 WNL NEG HR HPV 01-04-14 WNL History of abnormal Pap:  no TDaP:  03/03/17  Gardasil: ***   reports that she has never smoked. She has never used smokeless tobacco. She reports that she does not drink alcohol or use drugs.  Past Medical History:  Diagnosis Date  . Anemia   . Low back pain 01/20/2013  . MS (multiple sclerosis) ([facility])   . Multiple sclerosis ([facility]) 01/12/2013    Past Surgical History:  Procedure Laterality Date  . NONE      Current Outpatient Medications  Medication Sig Dispense Refill  . azelastine (ASTELIN) 0.1 % nasal spray Place 1 spray into both nostrils 2 (two) times daily.     . Fingolimod HCl (GILENYA) 0.5 MG CAPS TAKE 1 CAPSULE (0.5MG  TOTAL) BY MOUTH DAILY 30 capsule 11  . fluticasone (FLONASE) 50 MCG/ACT nasal spray Place 1 spray into both nostrils 2 (two) times daily.     . folic acid (FOLVITE) 1 MG tablet Take 1 tablet (1 mg total) by mouth daily. (Patient not taking: Reported on 01/07/2019) 30 tablet 6  . gabapentin (NEURONTIN) 400 MG capsule Take 1 capsule (400 mg total) by mouth 3 (three) times daily. 270 capsule 4  . Iron-FA-B Cmp-C-Biot-Probiotic (FUSION PLUS) CAPS Take 1 capsule by mouth daily. (Patient not taking: Reported on 01/07/2019) 30 capsule 6   No current facility-administered medications for this visit.   Facility-Administered Medications Ordered in Other Visits  Medication Dose Route Frequency Provider Last Rate Last Admin  . gadopentetate dimeglumine (MAGNEVIST) injection 20 mL  20 mL Intravenous Once PRN Butch Penny, NP        Family History  Problem Relation Age of Onset  . Hypertension Maternal  Grandmother   . Diabetes Maternal Grandmother   . Hypertension Paternal Grandmother     Review of Systems  Exam:   There were no vitals taken for this visit.  Weight change: @WEIGHTCHANGE @ Height:      Ht Readings from Last 3 Encounters:  07/21/19 5\' 11"  (1.803 m)  01/07/19 5\' 11"  (1.803 m)  09/30/18 5\' 11"  (1.803 m)    General appearance: alert, cooperative and appears stated age Head: Normocephalic, without obvious abnormality, atraumatic Neck: no adenopathy, supple, symmetrical, trachea midline and thyroid {CHL AMB PHY EX THYROID NORM [phone]::"normal to inspection and palpation"} Lungs: clear to auscultation bilaterally Cardiovascular: regular rate and rhythm Breasts: {Exam; breast:13139::"normal appearance, no masses or tenderness"} Abdomen: soft, non-tender; non distended,  no masses,  no organomegaly Extremities: extremities normal, atraumatic, no cyanosis or edema Skin: Skin color, texture, turgor normal. No rashes or lesions Lymph nodes: Cervical, supraclavicular, and axillary nodes normal. No abnormal inguinal nodes palpated Neurologic: Grossly normal   Pelvic: External genitalia:  no lesions              Urethra:  normal appearing urethra with no masses, tenderness or lesions              Bartholins and Skenes: normal                 Vagina: normal appearing vagina with normal color and discharge, no  lesions              Cervix: {CHL AMB PHY EX CERVIX NORM DEFAULT:[phone]::"no lesions"}               Bimanual Exam:  Uterus:  {CHL AMB PHY EX UTERUS NORM DEFAULT:[phone]::"normal size, contour, position, consistency, mobility, non-tender"}              Adnexa: {CHL AMB PHY EX ADNEXA NO MASS DEFAULT:[phone]::"no mass, fullness, tenderness"}               Rectovaginal: Confirms               Anus:  normal sphincter tone, no lesions  *** chaperoned for the exam.  A:  Well Woman with normal exam  P:

## 2020-01-24 NOTE — Telephone Encounter (Signed)
Patient called to inform FMLA forms have not been received and confirmed fax#[phone]

## 2020-01-25 ENCOUNTER — Other Ambulatory Visit: Payer: Self-pay

## 2020-01-26 ENCOUNTER — Ambulatory Visit: Payer: Self-pay | Admitting: Obstetrics and Gynecology

## 2020-01-27 ENCOUNTER — Encounter: Payer: Self-pay | Admitting: Neurology

## 2020-01-27 ENCOUNTER — Other Ambulatory Visit: Payer: Self-pay

## 2020-01-27 ENCOUNTER — Ambulatory Visit (INDEPENDENT_AMBULATORY_CARE_PROVIDER_SITE_OTHER): Payer: 59 | Admitting: Neurology

## 2020-01-27 VITALS — BP 123/83 | HR 68 | Temp 97.4°F | Ht 71.0 in | Wt 240.1 lb

## 2020-01-27 DIAGNOSIS — R200 Anesthesia of skin: Secondary | ICD-10-CM | POA: Diagnosis not present

## 2020-01-27 DIAGNOSIS — R202 Paresthesia of skin: Secondary | ICD-10-CM

## 2020-01-27 DIAGNOSIS — G35 Multiple sclerosis: Secondary | ICD-10-CM | POA: Diagnosis not present

## 2020-01-27 MED ORDER — GABAPENTIN 400 MG PO CAPS: 400.0000 mg | ORAL_CAPSULE | Freq: Three times a day (TID) | ORAL | 4 refills | Status: DC

## 2020-01-27 NOTE — Patient Instructions (Signed)
Great to see you today! Continue current medications Check blood work  See you in 6 months

## 2020-01-27 NOTE — Progress Notes (Signed)
PATIENT: Carol [name] DOB: Apr 07, 1986  REASON FOR VISIT: follow up HISTORY FROM: patient  HISTORY OF PRESENT ILLNESS: Today 01/27/20  HISTORY   Carol [name] is a 34 year old female with a history of relapse remitting multiple sclerosis.  She wasenrolled in [employer], randomized to Gilenya,sinceMarch 5th 2014.   In March 2013, she developed new-onset right eye pain and blurred vision. She was initially diagnosed with corneal abrasion. She went to the emergency room twice for this. She then followed up with ophthalmology, who ordered MRI of the brain and orbits. This showed acute right optic neuritis and 7-8 chronicdemyelinatingplaques. She was diagnosed with probable multiple sclerosis   Around beginning of December 2013, patient developed new symptoms of numbness in her bilateral fingertips and bilateral toes, lasted for 2 weeks.  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 Patient also has intermittent muscle spasms and balance difficulty. No family history of MS.  MRI brain showed approximately six lesions in the brain which are consistent with multiple sclerosis. there is abnormality in the right optic nerve consistent with multiple sclerosis, see below report. No enhancing lesions are identified.  MRI ORBITSin April 2013: Mild swelling of hyperintensity in the right optic nerve is present.Showed abnormal enhancement with slight stranding in the surrounding orbital fat suggesting acute inflammation. Enhancement is most prominent within the optic canal on the right. Optic chiasm is normal. The left optic nerve is normal.  She remains on Gilenya and is tolerating it well. She denies any new numbness or weakness. She continues to report numbness in the hands and lower extremity. She states occasionally she'll have burning and tingling pain in the right leg. The  symptoms are intermittent and has occurred before. She denies any changes with her gait or balance. Denies any changes with her vision. No change in her mood or behavior. Her last MRI of the brain was in 2017. At that time he did show 2 new lesions however the patient had been off her Gilenya for 5 months. The patient also sees a hematologist for anemia. She denies any new neurological symptoms. She returns today for an evaluation.  Update 03/16/2019 YY: There was no significant worsening of her MS symptoms, she continue have intermittent bilateral upper and lower extremity paresthesia, taking gabapentin 400 mg 3 times a day,  She denies visual loss, no significant gait abnormality, has been taking Gilenya since 2014, tolerating it well,  I personally reviewed MRI brain in Oct 2018:Multiple round and ovoid periventricular and subcortical foci of chronic demyelinating plaque, no change compared to scan in November 2019  She presented to emergency room on January 07, 2019 for fever, 104, whole body achy pain, cough, chest x-ray showed low lung volumes with bibasilar atelectasis, infiltrates, patient symptom last for 1 week, now improved, per patient, COVID-19 RNA testing was negative, but I do not find the report  Laboratory evaluations showed hemoglobin of 12.6, MCV of 75.4, platelet was 132, BMP showed potassium of 3.3, glucose was elevated 115,  Update July 21, 2019 SS: Here today complaining of new problem of daily headache x1 week.  She says the pain has been persistent, describes the location as generalized, feels like her head is " booming".  She denies any changes to her vision, or nausea/vomiting.  She says the pain is 8/10.  She will take a Goody powder tablet with relief.  She has continued to work.  She denies  any new numbness or weakness in her arms or legs.  She denies any changes to her bowels or bladder.  She indicates she has been sleeping well.  She wonders if her headache could  be related to the change in weather/allergies.  She does not have history of headache in the past.  She remains on gabapentin for paresthesia in bilateral hands and muscle spasms.  She has not had recent change in gait or falls.  She had a recent eye exam while taking Gilenya and reports it is normal (10 days ago).  Update January 27 2020 SS: When last seen,  MRI of the brain with and without contrast was done, due to report of new onset headache, did not show acute findings, MRI stable from prior in October 2018.  No further headaches.  She remains on Gilenya, remains stable.  She has paresthesia in bilateral hands, and muscle spasms, symptoms are well controlled with gabapentin.  She denies any new problems.  No falls her changes to balance.  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  Needs her FMLA papers redone, due to her company switching systems.  She presents today for follow-up unaccompanied.  In November, alkaline phosphatase was 140, ALT 35 (around that time taking Tylenol, BC powder for headache)  REVIEW OF SYSTEMS: Out of a complete 14 system review of symptoms, the patient complains only of the following symptoms, and all other reviewed systems are negative.  Paresthesia  ALLERGIES: No Known Allergies  HOME MEDICATIONS: Outpatient Medications Prior to Visit  Medication Sig Dispense Refill  . Fingolimod HCl (GILENYA) 0.5 MG CAPS TAKE 1 CAPSULE (0.5MG  TOTAL) BY MOUTH DAILY 30 capsule 11  . gabapentin (NEURONTIN) 400 MG capsule Take 1 capsule (400 mg total) by mouth 3 (three) times daily. 270 capsule 4  . azelastine (ASTELIN) 0.1 % nasal spray Place 1 spray into both nostrils 2 (two) times daily.     . fluticasone (FLONASE) 50 MCG/ACT nasal spray Place 1 spray into both nostrils 2 (two) times daily.     . folic acid (FOLVITE) 1 MG tablet Take 1 tablet (1 mg total) by mouth daily. (Patient not taking: Reported on 01/07/2019) 30 tablet 6  . Iron-FA-B Cmp-C-Biot-Probiotic  (FUSION PLUS) CAPS Take 1 capsule by mouth daily. (Patient not taking: Reported on 01/07/2019) 30 capsule 6   Facility-Administered Medications Prior to Visit  Medication Dose Route Frequency Provider Last Rate Last Admin  . gadopentetate dimeglumine (MAGNEVIST) injection 20 mL  20 mL Intravenous Once PRN Butch Penny, NP        PAST MEDICAL HISTORY: Past Medical History:  Diagnosis Date  . Anemia   . Low back pain 01/20/2013  . MS (multiple sclerosis) (HCC)   . Multiple sclerosis (HCC) 01/12/2013    PAST SURGICAL HISTORY: Past Surgical History:  Procedure Laterality Date  . NONE      FAMILY HISTORY: Family History  Problem Relation Age of Onset  . Hypertension Maternal Grandmother   . Diabetes Maternal Grandmother   . Hypertension Paternal Grandmother     SOCIAL HISTORY: Social History   Socioeconomic History  . Marital status: Married    Spouse name: Not on file  . Number of children: 0  . Years of education: 59  . Highest education level: Not on file  Occupational History    Comment: [employer]  Tobacco Use  . Smoking status: Never Smoker  . Smokeless tobacco: Never Used  Substance and Sexual Activity  .  Alcohol use: No    Alcohol/week: 0.0 standard drinks  . Drug use: No  . Sexual activity: Yes    Partners: Male    Birth control/protection: None    Comment: last sex 01-11-18  Other Topics Concern  . Not on file  Social History Narrative   Patient is working at [employer] time., lives with her husband, no children.   Patient is right handed.   Patient has a high school education and some college.   Patient does not drink caffeine.   Social Determinants of Health   Financial Resource Strain:   . Difficulty of Paying Living Expenses:   Food Insecurity:   . Worried About Charity fundraiser in the Last Year:   . Arboriculturist in the Last Year:   Transportation Needs:   . Film/video editor (Medical):   Marland Kitchen Lack of  Transportation (Non-Medical):   Physical Activity:   . Days of Exercise per Week:   . Minutes of Exercise per Session:   Stress:   . Feeling of Stress :   Social Connections:   . Frequency of Communication with Friends and Family:   . Frequency of Social Gatherings with Friends and Family:   . Attends Religious Services:   . Active Member of Clubs or Organizations:   . Attends Archivist Meetings:   Marland Kitchen Marital Status:   Intimate Partner Violence:   . Fear of Current or Ex-Partner:   . Emotionally Abused:   Marland Kitchen Physically Abused:   . Sexually Abused:       PHYSICAL EXAM  Vitals:   01/27/20 1356  BP: 123/83  Pulse: 68  Temp: (!) 97.4 F (36.3 C)  Weight: 240 lb 1.1 oz (108.9 kg)  Height: 5\' 11"  (1.803 m)   Body mass index is 33.48 kg/m.  Generalized: Well developed, in no acute distress   Neurological examination  Mentation: Alert oriented to time, place, history taking. Follows all commands speech and language fluent Cranial nerve II-XII: Pupils were equal round reactive to light. Extraocular movements were full, visual field were full on confrontational test. Facial sensation and strength were normal.  Head turning and shoulder shrug  were normal and symmetric. Motor: The motor testing reveals 5 over 5 strength of all 4 extremities. Good symmetric motor tone is noted throughout.  Sensory: Sensory testing is intact to soft touch on all 4 extremities. No evidence of extinction is noted.  Coordination: Cerebellar testing reveals good finger-nose-finger and heel-to-shin bilaterally.  Gait and station: Gait is normal. Tandem gait is normal. Romberg is negative. No drift is seen.  Reflexes: Deep tendon reflexes are symmetric and normal bilaterally.   DIAGNOSTIC DATA (LABS, IMAGING, TESTING) - I reviewed patient records, labs, notes, testing and imaging myself where available.  Lab Results  Component Value Date   WBC 9.0 07/21/2019   HGB 11.7 07/21/2019   HCT  34.7 07/21/2019   MCV 74 (L) 07/21/2019   PLT 216 07/21/2019      Component Value Date/Time   NA 141 09/13/2019 1527   K 3.9 09/13/2019 1527   K 3.7 04/02/2017 1256   CL 104 09/13/2019 1527   CL 103 04/02/2017 1256   CO2 25 09/13/2019 1527   CO2 29 04/02/2017 1256   GLUCOSE 99 09/13/2019 1527   GLUCOSE 115 (H) 01/07/2019 0644   BUN 9 09/13/2019 1527   CREATININE 0.65 09/13/2019 1527   CREATININE 0.71 04/02/2017 1256   CALCIUM 9.3  09/13/2019 1527   CALCIUM 9.6 04/02/2017 1256   PROT 6.6 09/13/2019 1527   ALBUMIN 4.1 09/13/2019 1527   ALBUMIN 4.2 04/02/2017 1256   AST 33 09/13/2019 1527   AST 18 04/02/2017 1256   ALT 35 (H) 09/13/2019 1527   ALT 20 04/02/2017 1256   ALKPHOS 140 (H) 09/13/2019 1527   ALKPHOS 92 04/02/2017 1256   BILITOT 0.2 09/13/2019 1527   GFRNONAA 117 09/13/2019 1527   GFRAA 135 09/13/2019 1527   Lab Results  Component Value Date   CHOL 159 03/03/2017   HDL 50 (L) 03/03/2017   LDLCALC 96 03/03/2017   TRIG 63 03/03/2017   CHOLHDL 3.2 03/03/2017   Lab Results  Component Value Date   HGBA1C 5.2 03/03/2017   Lab Results  Component Value Date   VITAMINB12 749 04/02/2017   Lab Results  Component Value Date   TSH 0.719 06/13/2015      ASSESSMENT AND PLAN 34 y.o. year old female  has a past medical history of Anemia, Low back pain (01/20/2013), MS (multiple sclerosis) (HCC), and Multiple sclerosis (HCC) (01/12/2013). here with:  1.  Multiple sclerosis -Overall, stable, no new symptoms -MRI of the brain in October 2020, stable from prior in October 2018 -Continue Gilenya, tolerating well, continue gabapentin for paresthesia and muscle spasms -Check CBC with differential, CMP -Follow-up in 6 months or sooner if needed  2.  Headache -Is no longer an issue  I spent 20 minutes of face-to-face and non-face-to-face time with patient.  This included previsit chart review, lab review, study review, order entry, electronic health record documentation,  patient education.  Margie Ege, AGNP-C, DNP 01/27/2020, 2:17 PM [facility] [address], [address] [place], [place] [address] [phone]

## 2020-01-28 LAB — CBC WITH DIFFERENTIAL/PLATELET
Basophils Absolute: 0 10*3/uL (ref 0.0–0.2)
Basos: 0 %
EOS (ABSOLUTE): 0.1 10*3/uL (ref 0.0–0.4)
Eos: 2 %
Hematocrit: 35.4 % (ref 34.0–46.6)
Hemoglobin: 12.1 g/dL (ref 11.1–15.9)
Immature Grans (Abs): 0 10*3/uL (ref 0.0–0.1)
Immature Granulocytes: 0 %
Lymphocytes Absolute: 0.9 10*3/uL (ref 0.7–3.1)
Lymphs: 12 %
MCH: 25.6 pg — ABNORMAL LOW (ref 26.6–33.0)
MCHC: 34.2 g/dL (ref 31.5–35.7)
MCV: 75 fL — ABNORMAL LOW (ref 79–97)
Monocytes Absolute: 0.7 10*3/uL (ref 0.1–0.9)
Monocytes: 10 %
Neutrophils Absolute: 5.5 10*3/uL (ref 1.4–7.0)
Neutrophils: 76 %
Platelets: 196 10*3/uL (ref 150–450)
RBC: 4.73 x10E6/uL (ref 3.77–5.28)
RDW: 15.8 % — ABNORMAL HIGH (ref 11.7–15.4)
WBC: 7.3 10*3/uL (ref 3.4–10.8)

## 2020-01-28 LAB — COMPREHENSIVE METABOLIC PANEL
ALT: 42 IU/L — ABNORMAL HIGH (ref 0–32)
AST: 28 IU/L (ref 0–40)
Albumin/Globulin Ratio: 1.8 (ref 1.2–2.2)
Albumin: 4.4 g/dL (ref 3.8–4.8)
Alkaline Phosphatase: 148 IU/L — ABNORMAL HIGH (ref 39–117)
BUN/Creatinine Ratio: 10 (ref 9–23)
BUN: 7 mg/dL (ref 6–20)
Bilirubin Total: 0.5 mg/dL (ref 0.0–1.2)
CO2: 24 mmol/L (ref 20–29)
Calcium: 9.1 mg/dL (ref 8.7–10.2)
Chloride: 103 mmol/L (ref 96–106)
Creatinine, Ser: 0.73 mg/dL (ref 0.57–1.00)
GFR calc Af Amer: 124 mL/min/{1.73_m2} (ref >59)
GFR calc non Af Amer: 108 mL/min/{1.73_m2} (ref >59)
Globulin, Total: 2.5 g/dL (ref 1.5–4.5)
Glucose: 82 mg/dL (ref 65–99)
Potassium: 4.1 mmol/L (ref 3.5–5.2)
Sodium: 141 mmol/L (ref 134–144)
Total Protein: 6.9 g/dL (ref 6.0–8.5)

## 2020-01-31 ENCOUNTER — Encounter: Payer: Self-pay | Admitting: Neurology

## 2020-01-31 ENCOUNTER — Telehealth: Payer: Self-pay

## 2020-01-31 NOTE — Telephone Encounter (Signed)
Pt verified by name and DOB, results given per provider, pt voiced understanding all question answered. 

## 2020-02-02 NOTE — Telephone Encounter (Signed)
Faxed to [phone], received confirmation [facility] LOA dept.

## 2020-02-14 ENCOUNTER — Ambulatory Visit: Payer: Self-pay | Admitting: Obstetrics and Gynecology

## 2020-02-29 NOTE — Progress Notes (Signed)
I have reviewed and agreed above plan. 

## 2020-03-06 NOTE — Progress Notes (Deleted)
34 y.o. G0P0000 Married Black or [place] American Not Hispanic or Latino female here for annual exam.      No LMP recorded.          Sexually active: {yes no:314532}  The current method of family planning is {contraception:315051}.    Exercising: {yes no:314532}  {types:19826} Smoker:  {YES [id number]  Health Maintenance: Pap:  02/29/16 normal HPV Neg, 01-04-14 WNL History of abnormal Pap:  no TDaP: 03/03/17 Gardasil: ***   reports that she has never smoked. She has never used smokeless tobacco. She reports that she does not drink alcohol or use drugs.  Past Medical History:  Diagnosis Date  . Anemia   . Low back pain 01/20/2013  . MS (multiple sclerosis) (HCC)   . Multiple sclerosis (HCC) 01/12/2013    Past Surgical History:  Procedure Laterality Date  . NONE      Current Outpatient Medications  Medication Sig Dispense Refill  . Fingolimod HCl (GILENYA) 0.5 MG CAPS TAKE 1 CAPSULE (0.5MG  TOTAL) BY MOUTH DAILY 30 capsule 11  . gabapentin (NEURONTIN) 400 MG capsule Take 1 capsule (400 mg total) by mouth 3 (three) times daily. 270 capsule 4   No current facility-administered medications for this visit.   Facility-Administered Medications Ordered in Other Visits  Medication Dose Route Frequency Provider Last Rate Last Admin  . gadopentetate dimeglumine (MAGNEVIST) injection 20 mL  20 mL Intravenous Once PRN Butch Penny, NP        Family History  Problem Relation Age of Onset  . Hypertension Maternal Grandmother   . Diabetes Maternal Grandmother   . Hypertension Paternal Grandmother     Review of Systems  Exam:   There were no vitals taken for this visit.  Weight change: @WEIGHTCHANGE @ Height:      Ht Readings from Last 3 Encounters:  01/27/20 5\' 11"  (1.803 m)  07/21/19 5\' 11"  (1.803 m)  01/07/19 5\' 11"  (1.803 m)    General appearance: alert, cooperative and appears stated age Head: Normocephalic, without obvious abnormality, atraumatic Neck: no adenopathy,  supple, symmetrical, trachea midline and thyroid {CHL AMB PHY EX THYROID NORM [phone]::"normal to inspection and palpation"} Lungs: clear to auscultation bilaterally Cardiovascular: regular rate and rhythm Breasts: {Exam; breast:13139::"normal appearance, no masses or tenderness"} Abdomen: soft, non-tender; non distended,  no masses,  no organomegaly Extremities: extremities normal, atraumatic, no cyanosis or edema Skin: Skin color, texture, turgor normal. No rashes or lesions Lymph nodes: Cervical, supraclavicular, and axillary nodes normal. No abnormal inguinal nodes palpated Neurologic: Grossly normal   Pelvic: External genitalia:  no lesions              Urethra:  normal appearing urethra with no masses, tenderness or lesions              Bartholins and Skenes: normal                 Vagina: normal appearing vagina with normal color and discharge, no lesions              Cervix: {CHL AMB PHY EX CERVIX NORM [phone]::"no lesions"}               Bimanual Exam:  Uterus:  {CHL AMB PHY EX UTERUS NORM DEFAULT:[phone]::"normal size, contour, position, consistency, mobility, non-tender"}              Adnexa: {CHL AMB PHY EX ADNEXA NO MASS DEFAULT:[phone]::"no mass, fullness, tenderness"}  Rectovaginal: Confirms               Anus:  normal sphincter tone, no lesions  *** chaperoned for the exam.  A:  Well Woman with normal exam  P:

## 2020-03-07 ENCOUNTER — Other Ambulatory Visit: Payer: Self-pay

## 2020-03-08 ENCOUNTER — Ambulatory Visit: Payer: Self-pay | Admitting: Obstetrics and Gynecology

## 2020-03-15 ENCOUNTER — Encounter: Payer: Self-pay | Admitting: Neurology

## 2020-07-12 ENCOUNTER — Encounter: Payer: Self-pay | Admitting: Neurology

## 2020-07-25 ENCOUNTER — Encounter: Payer: Self-pay | Admitting: Obstetrics and Gynecology

## 2020-07-25 ENCOUNTER — Telehealth: Payer: Self-pay | Admitting: *Deleted

## 2020-07-25 ENCOUNTER — Other Ambulatory Visit: Payer: Self-pay

## 2020-07-25 ENCOUNTER — Other Ambulatory Visit: Payer: Self-pay | Admitting: *Deleted

## 2020-07-25 ENCOUNTER — Ambulatory Visit (INDEPENDENT_AMBULATORY_CARE_PROVIDER_SITE_OTHER): Payer: No Typology Code available for payment source | Admitting: Obstetrics and Gynecology

## 2020-07-25 VITALS — BP 100/66 | HR 88 | Ht 72.0 in | Wt 233.0 lb

## 2020-07-25 DIAGNOSIS — Z0000 Encounter for general adult medical examination without abnormal findings: Secondary | ICD-10-CM | POA: Diagnosis not present

## 2020-07-25 DIAGNOSIS — Z124 Encounter for screening for malignant neoplasm of cervix: Secondary | ICD-10-CM

## 2020-07-25 DIAGNOSIS — N915 Oligomenorrhea, unspecified: Secondary | ICD-10-CM

## 2020-07-25 DIAGNOSIS — Z01419 Encounter for gynecological examination (general) (routine) without abnormal findings: Secondary | ICD-10-CM | POA: Diagnosis not present

## 2020-07-25 NOTE — Patient Instructions (Signed)
EXERCISE AND DIET:  We recommended that you start or continue a regular exercise program for good health. Regular exercise means any activity that makes your heart beat faster and makes you sweat.  We recommend exercising at least 30 minutes per day at least 3 days a week, preferably 4 or 5.  We also recommend a diet low in fat and sugar.  Inactivity, poor dietary choices and obesity can cause diabetes, heart attack, stroke, and kidney damage, among others.    ALCOHOL AND SMOKING:  Women should limit their alcohol intake to no more than 7 drinks/beers/glasses of wine (combined, not each!) per week. Moderation of alcohol intake to this level decreases your risk of breast cancer and liver damage. And of course, no recreational drugs are part of a healthy lifestyle.  And absolutely no smoking or even second hand smoke. Most people know smoking can cause heart and lung diseases, but did you know it also contributes to weakening of your bones? Aging of your skin?  Yellowing of your teeth and nails?  CALCIUM AND VITAMIN D:  Adequate intake of calcium and Vitamin D are recommended.  The recommendations for exact amounts of these supplements seem to change often, but generally speaking 1,000 mg of calcium (between diet and supplement) and 800 units of Vitamin D per day seems prudent. Certain women may benefit from higher intake of Vitamin D.  If you are among these women, your doctor will have told you during your visit.    PAP SMEARS:  Pap smears, to check for cervical cancer or precancers,  have traditionally been done yearly, although recent scientific advances have shown that most women can have pap smears less often.  However, every woman still should have a physical exam from her gynecologist every year. It will include a breast check, inspection of the vulva and vagina to check for abnormal growths or skin changes, a visual exam of the cervix, and then an exam to evaluate the size and shape of the uterus and  ovaries.  And after 34 years of age, a rectal exam is indicated to check for rectal cancers. We will also provide age appropriate advice regarding health maintenance, like when you should have certain vaccines, screening for sexually transmitted diseases, bone density testing, colonoscopy, mammograms, etc.   MAMMOGRAMS:  All women over 40 years old should have a yearly mammogram. Many facilities now offer a "3D" mammogram, which may cost around $50 extra out of pocket. If possible,  we recommend you accept the option to have the 3D mammogram performed.  It both reduces the number of women who will be called back for extra views which then turn out to be normal, and it is better than the routine mammogram at detecting truly abnormal areas.    COLON CANCER SCREENING: Now recommend starting at age 45. At this time colonoscopy is not covered for routine screening until 50. There are take home tests that can be done between 45-49.   COLONOSCOPY:  Colonoscopy to screen for colon cancer is recommended for all women at age 50.  We know, you hate the idea of the prep.  We agree, BUT, having colon cancer and not knowing it is worse!!  Colon cancer so often starts as a polyp that can be seen and removed at colonscopy, which can quite literally save your life!  And if your first colonoscopy is normal and you have no family history of colon cancer, most women don't have to have it again for   10 years.  Once every ten years, you can do something that may end up saving your life, right?  We will be happy to help you get it scheduled when you are ready.  Be sure to check your insurance coverage so you understand how much it will cost.  It may be covered as a preventative service at no cost, but you should check your particular policy.      Breast Self-Awareness Breast self-awareness means being familiar with how your breasts look and feel. It involves checking your breasts regularly and reporting any changes to your  health care provider. Practicing breast self-awareness is important. A change in your breasts can be a sign of a serious medical problem. Being familiar with how your breasts look and feel allows you to find any problems early, when treatment is more likely to be successful. All women should practice breast self-awareness, including women who have had breast implants. How to do a breast self-exam One way to learn what is normal for your breasts and whether your breasts are changing is to do a breast self-exam. To do a breast self-exam: Look for Changes  1. Remove all the clothing above your waist. 2. Stand in front of a mirror in a room with good lighting. 3. Put your hands on your hips. 4. Push your hands firmly downward. 5. Compare your breasts in the mirror. Look for differences between them (asymmetry), such as: ? Differences in shape. ? Differences in size. ? Puckers, dips, and bumps in one breast and not the other. 6. Look at each breast for changes in your skin, such as: ? Redness. ? Scaly areas. 7. Look for changes in your nipples, such as: ? Discharge. ? Bleeding. ? Dimpling. ? Redness. ? A change in position. Feel for Changes Carefully feel your breasts for lumps and changes. It is best to do this while lying on your back on the floor and again while sitting or standing in the shower or tub with soapy water on your skin. Feel each breast in the following way:  Place the arm on the side of the breast you are examining above your head.  Feel your breast with the other hand.  Start in the nipple area and make  inch (2 cm) overlapping circles to feel your breast. Use the pads of your three middle fingers to do this. Apply light pressure, then medium pressure, then firm pressure. The light pressure will allow you to feel the tissue closest to the skin. The medium pressure will allow you to feel the tissue that is a little deeper. The firm pressure will allow you to feel the tissue  close to the ribs.  Continue the overlapping circles, moving downward over the breast until you feel your ribs below your breast.  Move one finger-width toward the center of the body. Continue to use the  inch (2 cm) overlapping circles to feel your breast as you move slowly up toward your collarbone.  Continue the up and down exam using all three pressures until you reach your armpit.  Write Down What You Find  Write down what is normal for each breast and any changes that you find. Keep a written record with breast changes or normal findings for each breast. By writing this information down, you do not need to depend only on memory for size, tenderness, or location. Write down where you are in your menstrual cycle, if you are still menstruating. If you are having trouble noticing differences   in your breasts, do not get discouraged. With time you will become more familiar with the variations in your breasts and more comfortable with the exam. How often should I examine my breasts? Examine your breasts every month. If you are breastfeeding, the best time to examine your breasts is after a feeding or after using a breast pump. If you menstruate, the best time to examine your breasts is 5-7 days after your period is over. During your period, your breasts are lumpier, and it may be more difficult to notice changes. When should I see my health care provider? See your health care provider if you notice:  A change in shape or size of your breasts or nipples.  A change in the skin of your breast or nipples, such as a reddened or scaly area.  Unusual discharge from your nipples.  A lump or thick area that was not there before.  Pain in your breasts.  Anything that concerns you.  Hysterosalpingography  Hysterosalpingography is a procedure in which a woman's uterus and fallopian tubes are examined. During this procedure, contrast dye is injected into the uterus through the vagina and cervix.  X-rays are then taken. The dye makes the uterus and fallopian tubes show up clearly on the X-rays. This procedure may be done:  To help determine whether there are tumors, scars (adhesions), or other abnormalities in the uterus.  To find out why a woman is unable to have children (infertile).  To make sure the fallopian tubes are completely blocked a few months after having certain tubal sterilization procedures. Tell a health care provider about:  Any allergies you have.  All medicines you are taking, including vitamins, herbs, eye drops, creams, and over-the-counter medicines.  Any problems you or family members have had with the use of anesthetic medicines.  Any blood disorders you have.  Any surgeries you have had.  Any medical conditions you have.  Whether you are pregnant or may be pregnant. What are the risks? Generally, this is a safe procedure. However, problems may occur, including:  Infection in the lining of the uterus (endometritis) or fallopian tubes (salpingitis).  Allergic reaction to medicines or dyes.  Risk of making a hole (perforation) in the uterus or fallopian tubes.  Damage to other structures or organs. What happens before the procedure?  Schedule the procedure after your menstrual period stops, but before your next ovulation. This is usually between day 5 and day 10 of your last period. Day 1 is the first day of your period.  Ask your health care provider about changing or stopping your regular medicines. This is especially important if you are taking diabetes medicines or blood thinners.  Empty your bladder before the procedure begins.  Plan to have someone take you home from the hospital or clinic. What happens during the procedure?  You may be given one of the following: ? A medicine to help you relax (sedative). ? An over-the-counter pain medicine.  You will lie down on your back and place your feet into footrests (stirrups).  A device  called a speculum will be inserted into your vagina. This allows your health care provider to see inside your vagina through to your cervix.  Your cervix will be washed with a germ-killing soap.  A medicine may be injected into your cervix to numb it (local anesthesia).  A thin, flexible tube will be passed through your cervix into your uterus.  Contrast dye will be passed through the tube and into  the uterus. Contrast dye may cause some cramping.  Several X-rays will be taken as the contrast dye spreads through the uterus and into the fallopian tubes.  The tube will be removed. The contrast dye will flow out through your vagina naturally. The procedure may vary among health care providers and hospitals. What happens after the procedure?  Most of the contrast dye will flow out of your vagina naturally. You may want to wear a sanitary pad.  You may have mild cramping and vaginal bleeding. This should go away after a short time.  Do not drive for 24 hours if you were given a sedative.  It is up to you to get the results of your procedure. Ask your health care provider, or the department that is doing the procedure, when your results will be ready. Summary  Hysterosalpingography is a procedure in which a woman's uterus and fallopian tubes are examined.  During this procedure, contrast dye is injected into the uterus through the vagina and cervix. X-rays are then taken. The dye helps the uterus and fallopian tubes show up clearly on the X-rays.  Schedule the procedure after your menstrual period stops, but before your next ovulation. This is usually between day 5 and day 10 of your last period.  After the procedure, you may have mild cramping and vaginal bleeding. This should go away after a short time. This information is not intended to replace advice given to you by your health care provider. Make sure you discuss any questions you have with your health care provider. Document  Revised: 09/19/2017 Document Reviewed: 10/30/2016 Elsevier Patient Education  2020 [employer].

## 2020-07-25 NOTE — Telephone Encounter (Signed)
Call placed to patient to provide update. Advised our office will f/u once I have confirmation on labs from [facility]. Patient agreeable and thankful for call.

## 2020-07-25 NOTE — Telephone Encounter (Signed)
Pt had appt with Dr. Oscar La for annual visit.  Could not get her labs drawn.  Would like to have done here at her appt if this is ok.  Will send to SS/NP and ask Deanna Artis in [facility] if would be ok,  (they use [facility] at there facility and lab request in Epic).  If problem will staff message her back.  [phone].  Spoke with Deanna Artis with [facility] in office she stated that she could if had [phone] # LIPID PANEL, TSH, PROLACTIN. Orders printed and will be given to lab when pt in for appt.

## 2020-07-25 NOTE — Telephone Encounter (Signed)
Patient was seen in office today as NGYN.  Prolactin, lipid panel and TSH labs ordered.  2 unsuccessful lab draw attempts, patient request to have labs drawn at upcoming neurology appt on 08/02/20, Dr. Oscar La agreeable.   Per review of Epic, patient is scheduled to see Margie Ege, NP on 08/02/20.  Call placed to [facility] at [phone], spoke with [facility]. She is going to review request with provider and their lab to confirm, she will f/u.

## 2020-07-25 NOTE — Progress Notes (Signed)
34 y.o. G0P0000 Married Black or [place] American Not Hispanic or Latino female here for annual exam.  She has irregular periods.  She has always had irregular cycles. Cycles can be every 1-3 months. She does have moliminal symptoms.  She has been sexually active with her husband for 10 years without birth control and without pregnancy. He has a child from another relationship. She would like to pursue fertility evaluation  Period Duration (Days): 5 Menstrual Flow: Moderate Menstrual Control: Thin pad Menstrual Control Change Freq (Hours): 6 Dysmenorrhea: None   H/O MS, in remission.  Patient's last menstrual period was 07/20/2020.          Sexually active: Yes.    The current method of family planning is none.    Exercising: No.  The patient does not participate in regular exercise at present. Smoker:  no  Health Maintenance: Pap:  02/29/16 WNL HR HPV Neg  History of abnormal Pap:  no MMG:  None  BMD:   None  Colonoscopy: none  TDaP:  03/03/17  Gardasil: Complete    reports that she has never smoked. She has never used smokeless tobacco. She reports that she does not drink alcohol and does not use drugs. She works in [employer], applications.   Past Medical History:  Diagnosis Date  . Anemia   . Low back pain 01/20/2013  . MS (multiple sclerosis) (HCC)   . Multiple sclerosis (HCC) 01/12/2013    Past Surgical History:  Procedure Laterality Date  . NONE      Current Outpatient Medications  Medication Sig Dispense Refill  . Fingolimod HCl (GILENYA) 0.5 MG CAPS TAKE 1 CAPSULE (0.5MG  TOTAL) BY MOUTH DAILY 30 capsule 11  . gabapentin (NEURONTIN) 400 MG capsule Take 1 capsule (400 mg total) by mouth 3 (three) times daily. 270 capsule 4   No current facility-administered medications for this visit.   Facility-Administered Medications Ordered in Other Visits  Medication Dose Route Frequency Provider Last Rate Last Admin  . gadopentetate dimeglumine (MAGNEVIST) injection 20 mL  20 mL  Intravenous Once PRN Butch Penny, NP        Family History  Problem Relation Age of Onset  . Hypertension Maternal Grandmother   . Diabetes Maternal Grandmother   . Hypertension Paternal Grandmother     Review of Systems  Genitourinary: Positive for menstrual problem.  All other systems reviewed and are negative.   Exam:   BP 100/66   Pulse 88   Ht 6' (1.829 m)   Wt 233 lb (105.7 kg)   LMP 07/20/2020   SpO2 98%   BMI 31.60 kg/m   Weight change: @WEIGHTCHANGE @ Height:   Height: 6' (182.9 cm)  Ht Readings from Last 3 Encounters:  07/25/20 6' (1.829 m)  01/27/20 5\' 11"  (1.803 m)  07/21/19 5\' 11"  (1.803 m)    General appearance: alert, cooperative and appears stated age Head: Normocephalic, without obvious abnormality, atraumatic Neck: no adenopathy, supple, symmetrical, trachea midline and thyroid normal to inspection and palpation Lungs: clear to auscultation bilaterally Cardiovascular: regular rate and rhythm Breasts: normal appearance, no masses or tenderness Abdomen: soft, non-tender; non distended,  no masses,  no organomegaly Extremities: extremities normal, atraumatic, no cyanosis or edema Skin: Skin color, texture, turgor normal. No rashes or lesions Lymph nodes: Cervical, supraclavicular, and axillary nodes normal. No abnormal inguinal nodes palpated Neurologic: Grossly normal   Pelvic: External genitalia:  no lesions              Urethra:  normal appearing urethra with no masses, tenderness or lesions              Bartholins and Skenes: normal                 Vagina: normal appearing vagina with normal color and discharge, no lesions              Cervix: no lesions               Bimanual Exam:  Uterus:  no masses or tenderness              Adnexa: no mass, fullness, tenderness               Rectovaginal: Confirms               Anus:  normal sphincter tone, no lesions  Carolynn Serve chaperoned for the exam.  A:  Well Woman with normal exam  Long term  oligomenorrhea  Primary infertility  P:   Pap with hpv  CBC and CMP UTD with Neurologist  Start [facility] charts, f/u in 3 months  Information given on SA and HSG  TSH, prolactin, Lipid panel

## 2020-07-25 NOTE — Addendum Note (Signed)
Addended by: Ginny Forth on: 07/25/2020 04:12 PM   Modules accepted: Orders

## 2020-07-26 ENCOUNTER — Other Ambulatory Visit: Payer: Self-pay

## 2020-07-26 ENCOUNTER — Other Ambulatory Visit (HOSPITAL_COMMUNITY)
Admission: RE | Admit: 2020-07-26 | Discharge: 2020-07-26 | Disposition: A | Discharge status: Home / Self Care | Payer: No Typology Code available for payment source | Source: Ambulatory Visit | Attending: Obstetrics and Gynecology | Admitting: Obstetrics and Gynecology

## 2020-07-26 DIAGNOSIS — Z124 Encounter for screening for malignant neoplasm of cervix: Secondary | ICD-10-CM | POA: Insufficient documentation

## 2020-07-26 NOTE — Progress Notes (Signed)
[facility] called and requested new order for Pap due to wrong order from 10/5/ entered  New Order placed for [facility].  Encounter closed

## 2020-07-27 MED ORDER — METRONIDAZOLE 500 MG PO TABS: 500.0000 mg | ORAL_TABLET | Freq: Two times a day (BID) | ORAL | 0 refills | Status: DC

## 2020-07-27 NOTE — Telephone Encounter (Signed)
Pt called and tearfully  states has seen mychart results from Pap from 10/6 and wants to know what the trich means.  Advised pt I will review with Dr Oscar La and return call today. Pt agreeable.   Routing to Dr Oscar La, please advise

## 2020-07-27 NOTE — Telephone Encounter (Signed)
Spoke with pt. Pt given results and recommendations per Dr Oscar La. Pt agreeable and verbalized understanding. EPT Rx written, reviewed and signed by Dr Oscar La and placed up front for pick up. Pt aware.  Pt scheduled for STD labs and other labs (Prolactin, TSH and lipid panel) on 10/13 at 1015 am. Pt agreeable to date and time.  Pt scheduled for 1 month follow up on 11/2 at 430 pm Dr Oscar La. Pt verbalized understanding.   Rx has been picked up by husband, Adam.  Encounter closed. Routing to Dr Oscar La for update on lab draw from [facility]. Pt states would come here instead of having labs drawn at [facility] appt.  Rx sent to pharmacy on file for pt. Orders placed for future labs.

## 2020-07-27 NOTE — Telephone Encounter (Signed)
-----   Message from Romualdo Bolk, MD sent at 07/27/2020  3:29 PM EDT ----- Please inform the patient that she has trich, this is an STD. Please add hpv, GC/CT to her pap.  She should have blood work for STD testing (HIV, RPR, HepBsAg, HepCab).  Treat for trich with 500 mg BID x 1 week.  Offer treatment to her partner 2 gm flagyl po x 1. Avoid intercourse until one week after they have both been treated then use condoms. She needs repeat testing in one month.  02 recall for pap

## 2020-07-28 LAB — CYTOLOGY - PAP
Chlamydia: NEGATIVE
Comment: NEGATIVE
Comment: NEGATIVE
Comment: NORMAL
Diagnosis: NEGATIVE
High risk HPV: NEGATIVE
Neisseria Gonorrhea: NEGATIVE

## 2020-08-02 ENCOUNTER — Other Ambulatory Visit: Payer: No Typology Code available for payment source

## 2020-08-02 ENCOUNTER — Ambulatory Visit (INDEPENDENT_AMBULATORY_CARE_PROVIDER_SITE_OTHER): Payer: No Typology Code available for payment source | Admitting: Neurology

## 2020-08-02 ENCOUNTER — Other Ambulatory Visit: Payer: Self-pay

## 2020-08-02 ENCOUNTER — Encounter: Payer: Self-pay | Admitting: Neurology

## 2020-08-02 VITALS — BP 127/87 | HR 84 | Ht 72.0 in | Wt 239.6 lb

## 2020-08-02 DIAGNOSIS — Z0000 Encounter for general adult medical examination without abnormal findings: Secondary | ICD-10-CM

## 2020-08-02 DIAGNOSIS — G35 Multiple sclerosis: Secondary | ICD-10-CM | POA: Diagnosis not present

## 2020-08-02 MED ORDER — GILENYA 0.5 MG PO CAPS: ORAL_CAPSULE | ORAL | 11 refills | Status: DC

## 2020-08-02 NOTE — Progress Notes (Signed)
PATIENT: Carol [name] DOB: June 14, 1986  REASON FOR VISIT: follow up HISTORY FROM: patient  HISTORY OF PRESENT ILLNESS: Today 08/02/20  HISTORY   Carol [name] is a 34 year old female with a history of relapse remitting multiple sclerosis.  She wasenrolled in [employer], randomized to Gilenya,sinceMarch 5th 2014.   In March 2013, she developed new-onset right eye pain and blurred vision. She was initially diagnosed with corneal abrasion. She went to the emergency room twice for this. She then followed up with ophthalmology, who ordered MRI of the brain and orbits. This showed acute right optic neuritis and 7-8 chronicdemyelinatingplaques. She was diagnosed with probable multiple sclerosis   Around beginning of December 2013, patient developed new symptoms of numbness in her bilateral fingertips and bilateral toes, lasted for 2 weeks.  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 Patient also has intermittent muscle spasms and balance difficulty. No family history of MS.  MRI brain showed approximately six lesions in the brain which are consistent with multiple sclerosis. there is abnormality in the right optic nerve consistent with multiple sclerosis, see below report. No enhancing lesions are identified.  MRI ORBITSin April 2013: Mild swelling of hyperintensity in the right optic nerve is present.Showed abnormal enhancement with slight stranding in the surrounding orbital fat suggesting acute inflammation. Enhancement is most prominent within the optic canal on the right. Optic chiasm is normal. The left optic nerve is normal.  She remains on Gilenya and is tolerating it well. She denies any new numbness or weakness. She continues to report numbness in the hands and lower extremity. She states occasionally she'll have burning and tingling pain in the right leg. The  symptoms are intermittent and has occurred before. She denies any changes with her gait or balance. Denies any changes with her vision. No change in her mood or behavior. Her last MRI of the brain was in 2017. At that time he did show 2 new lesions however the patient had been off her Gilenya for 5 months. The patient also sees a hematologist for anemia. She denies any new neurological symptoms. She returns today for an evaluation.  Update 03/16/2019 EQ:ASTMH was no significant worsening of her MS symptoms, she continue have intermittent bilateral upper and lower extremity paresthesia, taking gabapentin 400 mg 3 times a day,  She denies visual loss, no significant gait abnormality, has been taking Gilenya since 2014, tolerating it well,  I personally reviewed MRI brain in Oct 2018:Multiple round and ovoid periventricular and subcortical foci of chronic demyelinating plaque, no change compared to scan in November 2019  She presented to emergency room on January 07, 2019 for fever, 104, whole body achy pain, cough, chest x-ray showed low lung volumes with bibasilar atelectasis, infiltrates, patient symptom last for 1 week, now improved, per patient, COVID-19 RNA testing was negative, but I do not find the report  Laboratory evaluations showed hemoglobin of 12.6, MCV of 75.4, platelet was 132, BMP showed potassium of 3.3, glucose was elevated 115,  Update July 21, 2019 DQ:QIWL today complaining of new problem of daily headache x1 week. She says the pain has been persistent, describes the location as generalized, feels like her head is "booming".She denies any changes to her vision, or nausea/vomiting. She says the pain is 8/10.She will take a Goody powder tablet with relief. She has continued to work. She denies any new numbness or weakness in her arms or legs. She  denies any changes to her bowels or bladder. She indicates she has been sleeping well. She wonders if her headache could  be related to the change in weather/allergies. She does not have history of headache in the past. She remains on gabapentin for paresthesia in bilateral hands and muscle spasms. She has not had recent change in gait or falls. She had a recent eye exam while taking Gilenya and reports it is normal (10 days ago).  Update January 27 2020 SS: When last seen,  MRI of the brain with and without contrast was done, due to report of new onset headache, did not show acute findings, MRI stable from prior in October 2018.  No further headaches.  She remains on Gilenya, remains stable.  She has paresthesia in bilateral hands, and muscle spasms, symptoms are well controlled with gabapentin.  She denies any new problems.  No falls her changes to balance.  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  Needs her FMLA papers redone, due to her company switching systems.  She presents today for follow-up unaccompanied.  In November, alkaline phosphatase was 140, ALT 35 (around that time taking Tylenol, BC powder for headache)  Update August 02, 2020 SS: Doing well on Gilenya, denies any new worsening symptoms. Remains on gabapentin for numbness/spasms in hands. Continues to work full-time, balance is not great, but no falls. Saw eye doctor in June, everything looked good. No more headaches. MRI of the brain in October 2020, overall stable from prior in October 2018. MS stable.   REVIEW OF SYSTEMS: Out of a complete 14 system review of symptoms, the patient complains only of the following symptoms, and all other reviewed systems are negative.  N/A  ALLERGIES: No Known Allergies  HOME MEDICATIONS: Outpatient Medications Prior to Visit  Medication Sig Dispense Refill   gabapentin (NEURONTIN) 400 MG capsule Take 1 capsule (400 mg total) by mouth 3 (three) times daily. 270 capsule 4   metroNIDAZOLE (FLAGYL) 500 MG tablet Take 1 tablet (500 mg total) by mouth 2 (two) times daily. 14 tablet 0    Fingolimod HCl (GILENYA) 0.5 MG CAPS TAKE 1 CAPSULE (0.5MG  TOTAL) BY MOUTH DAILY 30 capsule 11   Facility-Administered Medications Prior to Visit  Medication Dose Route Frequency Provider Last Rate Last Admin   gadopentetate dimeglumine (MAGNEVIST) injection 20 mL  20 mL Intravenous Once PRN Butch Penny, NP        PAST MEDICAL HISTORY: Past Medical History:  Diagnosis Date   Anemia    Low back pain 01/20/2013   MS (multiple sclerosis) (HCC)    Multiple sclerosis (HCC) 01/12/2013    PAST SURGICAL HISTORY: Past Surgical History:  Procedure Laterality Date   NONE      FAMILY HISTORY: Family History  Problem Relation Age of Onset   Hypertension Maternal Grandmother    Diabetes Maternal Grandmother    Hypertension Paternal Grandmother     SOCIAL HISTORY: Social History   Socioeconomic History   Marital status: Married    Spouse name: Not on file   Number of children: 0   Years of education: 12   Highest education level: Not on file  Occupational History    Comment: [employer]  Tobacco Use   Smoking status: Never Smoker   Smokeless tobacco: Never Used  Building services engineer Use: Never used  Substance and Sexual Activity   Alcohol use: No    Alcohol/week: 0.0 standard drinks   Drug use: No  Sexual activity: Yes    Partners: Male    Birth control/protection: None    Comment: last sex 01-11-18  Other Topics Concern   Not on file  Social History Narrative   Patient is working at [employer] time., lives with her husband, no children.   Patient is right handed.   Patient has a high school education and some college.   Patient does not drink caffeine.   Social Determinants of Health   Financial Resource Strain:    Difficulty of Paying Living Expenses: Not on file  Food Insecurity:    Worried About Programme researcher, broadcasting/film/video in the Last Year: Not on file   [employer] of Food in the Last Year: Not on file  Transportation Needs:     Lack of Transportation (Medical): Not on file   Lack of Transportation (Non-Medical): Not on file  Physical Activity:    Days of Exercise per Week: Not on file   Minutes of Exercise per Session: Not on file  Stress:    Feeling of Stress : Not on file  Social Connections:    Frequency of Communication with Friends and Family: Not on file   Frequency of Social Gatherings with Friends and Family: Not on file   Attends Religious Services: Not on file   Active Member of Clubs or Organizations: Not on file   Attends Banker Meetings: Not on file   Marital Status: Not on file  Intimate Partner Violence:    Fear of Current or Ex-Partner: Not on file   Emotionally Abused: Not on file   Physically Abused: Not on file   Sexually Abused: Not on file   PHYSICAL EXAM  Vitals:   08/02/20 1351  BP: 127/87  Pulse: 84  Weight: 239 lb 9.6 oz (108.7 kg)  Height: 6' (1.829 m)   Body mass index is 32.5 kg/m.  Generalized: Well developed, in no acute distress   Neurological examination  Mentation: Alert oriented to time, place, history taking. Follows all commands speech and language fluent Cranial nerve II-XII: Pupils were equal round reactive to light. Extraocular movements were full, visual field were full on confrontational test. Facial sensation and strength were normal. Head turning and shoulder shrug  were normal and symmetric. Motor: The motor testing reveals 5 over 5 strength of all 4 extremities. Good symmetric motor tone is noted throughout.  Sensory: Sensory testing is intact to soft touch on all 4 extremities. No evidence of extinction is noted.  Coordination: Cerebellar testing reveals good finger-nose-finger and heel-to-shin bilaterally.  Gait and station: Gait is normal. Tandem gait is slightly unsteady. Reflexes: Deep tendon reflexes are symmetric and normal bilaterally.   DIAGNOSTIC DATA (LABS, IMAGING, TESTING) - I reviewed patient records, labs,  notes, testing and imaging myself where available.  Lab Results  Component Value Date   WBC 7.3 01/27/2020   HGB 12.1 01/27/2020   HCT 35.4 01/27/2020   MCV 75 (L) 01/27/2020   PLT 196 01/27/2020      Component Value Date/Time   NA 141 01/27/2020 1436   K 4.1 01/27/2020 1436   K 3.7 04/02/2017 1256   CL 103 01/27/2020 1436   CL 103 04/02/2017 1256   CO2 24 01/27/2020 1436   CO2 29 04/02/2017 1256   GLUCOSE 82 01/27/2020 1436   GLUCOSE 115 (H) 01/07/2019 0644   BUN 7 01/27/2020 1436   CREATININE 0.73 01/27/2020 1436   CREATININE 0.71 04/02/2017 1256   CALCIUM 9.1  01/27/2020 1436   CALCIUM 9.6 04/02/2017 1256   PROT 6.9 01/27/2020 1436   ALBUMIN 4.4 01/27/2020 1436   ALBUMIN 4.2 04/02/2017 1256   AST 28 01/27/2020 1436   AST 18 04/02/2017 1256   ALT 42 (H) 01/27/2020 1436   ALT 20 04/02/2017 1256   ALKPHOS 148 (H) 01/27/2020 1436   ALKPHOS 92 04/02/2017 1256   BILITOT 0.5 01/27/2020 1436   GFRNONAA 108 01/27/2020 1436   GFRAA 124 01/27/2020 1436   Lab Results  Component Value Date   CHOL 159 03/03/2017   HDL 50 (L) 03/03/2017   LDLCALC 96 03/03/2017   TRIG 63 03/03/2017   CHOLHDL 3.2 03/03/2017   Lab Results  Component Value Date   HGBA1C 5.2 03/03/2017   Lab Results  Component Value Date   VITAMINB12 749 04/02/2017   Lab Results  Component Value Date   TSH 0.719 06/13/2015   ASSESSMENT AND PLAN 34 y.o. year old female  has a past medical history of Anemia, Low back pain (01/20/2013), MS (multiple sclerosis) (HCC), and Multiple sclerosis (HCC) (01/12/2013). here with:  1.  Multiple sclerosis, relapsing remitting -MRI of the brain October 2020 stable from prior in October 2018 -Continue Gilenya -Continue gabapentin for paresthesia and muscle spasms -Check CBC with differential, CMP -Consider checking MRI of the brain at next visit -Follow up in 6 months or sooner if needed  2.  Headache -Resolved  I spent 20 minutes of face-to-face and  non-face-to-face time with patient.  This included previsit chart review, lab review, study review, order entry, electronic health record documentation, patient education.  Margie Ege, AGNP-C, DNP 08/02/2020, 2:11 PM [facility] [address] [place], [place] [address] [phone]

## 2020-08-02 NOTE — Patient Instructions (Signed)
Continue current medications  Check blood work today  See you back in 6 months   

## 2020-08-03 LAB — HEP, RPR, HIV PANEL
HIV Screen 4th Generation wRfx: NONREACTIVE
Hepatitis B Surface Ag: NEGATIVE
RPR Ser Ql: NONREACTIVE

## 2020-08-03 LAB — COMPREHENSIVE METABOLIC PANEL
ALT: 19 IU/L (ref 0–32)
AST: 20 IU/L (ref 0–40)
Albumin/Globulin Ratio: 1.9 (ref 1.2–2.2)
Albumin: 4.1 g/dL (ref 3.8–4.8)
Alkaline Phosphatase: 126 IU/L — ABNORMAL HIGH (ref 44–121)
BUN/Creatinine Ratio: 10 (ref 9–23)
BUN: 7 mg/dL (ref 6–20)
Bilirubin Total: 0.3 mg/dL (ref 0.0–1.2)
CO2: 27 mmol/L (ref 20–29)
Calcium: 9.5 mg/dL (ref 8.7–10.2)
Chloride: 103 mmol/L (ref 96–106)
Creatinine, Ser: 0.72 mg/dL (ref 0.57–1.00)
GFR calc Af Amer: 126 mL/min/{1.73_m2} (ref >59)
GFR calc non Af Amer: 110 mL/min/{1.73_m2} (ref >59)
Globulin, Total: 2.2 g/dL (ref 1.5–4.5)
Glucose: 110 mg/dL — ABNORMAL HIGH (ref 65–99)
Potassium: 3.7 mmol/L (ref 3.5–5.2)
Sodium: 143 mmol/L (ref 134–144)
Total Protein: 6.3 g/dL (ref 6.0–8.5)

## 2020-08-03 LAB — CBC WITH DIFFERENTIAL/PLATELET
Basophils Absolute: 0 10*3/uL (ref 0.0–0.2)
Basos: 0 %
EOS (ABSOLUTE): 0.1 10*3/uL (ref 0.0–0.4)
Eos: 2 %
Hematocrit: 36.7 % (ref 34.0–46.6)
Hemoglobin: 12.2 g/dL (ref 11.1–15.9)
Immature Grans (Abs): 0 10*3/uL (ref 0.0–0.1)
Immature Granulocytes: 0 %
Lymphocytes Absolute: 0.8 10*3/uL (ref 0.7–3.1)
Lymphs: 9 %
MCH: 24.4 pg — ABNORMAL LOW (ref 26.6–33.0)
MCHC: 33.2 g/dL (ref 31.5–35.7)
MCV: 74 fL — ABNORMAL LOW (ref 79–97)
Monocytes Absolute: 0.7 10*3/uL (ref 0.1–0.9)
Monocytes: 8 %
Neutrophils Absolute: 6.7 10*3/uL (ref 1.4–7.0)
Neutrophils: 81 %
Platelets: 209 10*3/uL (ref 150–450)
RBC: 4.99 x10E6/uL (ref 3.77–5.28)
RDW: 15.6 % — ABNORMAL HIGH (ref 11.7–15.4)
WBC: 8.3 10*3/uL (ref 3.4–10.8)

## 2020-08-03 LAB — TSH: TSH: 1.16 u[IU]/mL (ref 0.450–4.500)

## 2020-08-03 LAB — LIPID PANEL WITH LDL/HDL RATIO
Cholesterol, Total: 136 mg/dL (ref 100–199)
HDL: 47 mg/dL (ref >39)
LDL Chol Calc (NIH): 77 mg/dL (ref 0–99)
LDL/HDL Ratio: 1.6 ratio (ref 0.0–3.2)
Triglycerides: 58 mg/dL (ref 0–149)
VLDL Cholesterol Cal: 12 mg/dL (ref 5–40)

## 2020-08-03 LAB — PROLACTIN: Prolactin: 14.8 ng/mL (ref 4.8–23.3)

## 2020-08-03 LAB — HEPATITIS C ANTIBODY: Hep C Virus Ab: <0.1 s/co ratio (ref 0.0–0.9)

## 2020-08-06 ENCOUNTER — Other Ambulatory Visit: Payer: Self-pay

## 2020-08-06 ENCOUNTER — Encounter (HOSPITAL_COMMUNITY): Payer: Self-pay | Admitting: *Deleted

## 2020-08-06 ENCOUNTER — Ambulatory Visit (HOSPITAL_COMMUNITY)
Admission: EM | Admit: 2020-08-06 | Discharge: 2020-08-06 | Disposition: A | Discharge status: Home / Self Care | Payer: No Typology Code available for payment source | Attending: Emergency Medicine | Admitting: Emergency Medicine

## 2020-08-06 DIAGNOSIS — Z8619 Personal history of other infectious and parasitic diseases: Secondary | ICD-10-CM | POA: Insufficient documentation

## 2020-08-06 DIAGNOSIS — Z113 Encounter for screening for infections with a predominantly sexual mode of transmission: Secondary | ICD-10-CM | POA: Diagnosis present

## 2020-08-06 HISTORY — DX: Trichomoniasis, unspecified: A59.9

## 2020-08-06 NOTE — ED Provider Notes (Signed)
[facility]    CSN: [id number] Arrival date & time: 08/06/20  1736      History   Chief Complaint Chief Complaint  Patient presents with  . Exposure to STD    HPI Carol [name] is a 34 y.o. female.   Jerry Caras presents with requests for recheck of trichomonas. She tested positive, and completed a 7 day course of antibiotics on 10/14. Her partner was treated with a one day course of 4tabs. They had intercourse last night. She denies any further symptoms or vaginal discharge. No pelvic pain.    ROS per HPI, negative if not otherwise mentioned.       Past Medical History:  Diagnosis Date  . Anemia   . Low back pain 01/20/2013  . MS (multiple sclerosis) (HCC)   . Multiple sclerosis (HCC) 01/12/2013  . Trichomoniasis     Patient Active Problem List   Diagnosis Date Noted  . Headache 07/21/2019  . IDA (iron deficiency anemia) 04/02/2017  . Right optic neuritis 07/27/2015  . Numbness and tingling 06/10/2014  . Low back pain 01/20/2013  . Multiple sclerosis (HCC) 01/12/2013    Past Surgical History:  Procedure Laterality Date  . NONE      OB History    Gravida  0   Para  0   Term  0   Preterm  0   AB  0   Living  0     SAB  0   TAB  0   Ectopic  0   Multiple  0   Live Births               Home Medications    Prior to Admission medications   Medication Sig Start Date End Date Taking? Authorizing Provider  Fingolimod HCl (GILENYA) 0.5 MG CAPS TAKE 1 CAPSULE (0.5MG  TOTAL) BY MOUTH DAILY 08/02/20  Yes Glean Salvo, NP  gabapentin (NEURONTIN) 400 MG capsule Take 1 capsule (400 mg total) by mouth 3 (three) times daily. 01/27/20  Yes Glean Salvo, NP  metroNIDAZOLE (FLAGYL) 500 MG tablet Take 1 tablet (500 mg total) by mouth 2 (two) times daily. 07/27/20  Yes Romualdo Bolk, MD    Family History Family History  Problem Relation Age of Onset  . Healthy Mother   . Hypertension Maternal Grandmother   . Diabetes Maternal  Grandmother   . Hypertension Paternal Grandmother     Social History Social History   Tobacco Use  . Smoking status: Never Smoker  . Smokeless tobacco: Never Used  Vaping Use  . Vaping Use: Never used  Substance Use Topics  . Alcohol use: No  . Drug use: No     Allergies   Patient has no known allergies.   Review of Systems Review of Systems   Physical Exam Triage Vital Signs ED Triage Vitals  Enc Vitals Group     BP 08/06/20 1858 138/72     Pulse Rate 08/06/20 1858 69     Resp 08/06/20 1858 16     Temp 08/06/20 1858 98 F (36.7 C)     Temp src --      SpO2 08/06/20 1858 99 %     Weight --      Height --      Head Circumference --      Peak Flow --      Pain Score 08/06/20 1857 0     Pain Loc --      Pain  Edu? --      Excl. in GC? --    No data found.  Updated Vital Signs BP 138/72   Pulse 69   Temp 98 F (36.7 C)   Resp 16   LMP 07/20/2020   SpO2 99%   Visual Acuity Right Eye Distance:   Left Eye Distance:   Bilateral Distance:    Right Eye Near:   Left Eye Near:    Bilateral Near:     Physical Exam Constitutional:      General: She is not in acute distress.    Appearance: She is well-developed.  Cardiovascular:     Rate and Rhythm: Normal rate.  Pulmonary:     Effort: Pulmonary effort is normal.  Abdominal:     Palpations: Abdomen is not rigid.     Tenderness: There is no abdominal tenderness. There is no guarding or rebound.  Genitourinary:    Comments: Denies sores, lesions, vaginal bleeding; no pelvic pain; gu exam deferred at this time, vaginal self swab collected.   Skin:    General: Skin is warm and dry.  Neurological:     Mental Status: She is alert and oriented to person, place, and time.      UC Treatments / Results  Labs (all labs ordered are listed, but only abnormal results are displayed) Labs Reviewed  CERVICOVAGINAL ANCILLARY ONLY    EKG   Radiology No results found.  Procedures Procedures (including  critical care time)  Medications Ordered in UC Medications - No data to display  Initial Impression / Assessment and Plan / UC Course  I have reviewed the triage vital signs and the nursing notes.  Pertinent labs & imaging results that were available during my care of the patient were reviewed by me and considered in my medical decision making (see chart for details).     Will notify if positive. Unknown how long partner waited before sexual activity after taking meds, discussed it may be too early to detect new infection. Return if any new vaginal discharge. Patient verbalized understanding and agreeable to plan.   Final Clinical Impressions(s) / UC Diagnoses   Final diagnoses:  Screen for STD (sexually transmitted disease)   Discharge Instructions   None    ED Prescriptions    None     PDMP not reviewed this encounter.   Georgetta Haber, NP 08/06/20 1928

## 2020-08-06 NOTE — ED Triage Notes (Signed)
Denies sxs.  States completed treatment for trichomonas approx 1 wk ago; wishes to "make sure it's gone" and get STD check again.

## 2020-08-07 LAB — CERVICOVAGINAL ANCILLARY ONLY
Bacterial Vaginitis (gardnerella): NEGATIVE
Candida Glabrata: NEGATIVE
Candida Vaginitis: POSITIVE — AB
Chlamydia: NEGATIVE
Comment: NEGATIVE
Comment: NEGATIVE
Comment: NEGATIVE
Comment: NEGATIVE
Comment: NEGATIVE
Comment: NORMAL
Neisseria Gonorrhea: NEGATIVE
Trichomonas: NEGATIVE

## 2020-08-08 ENCOUNTER — Telehealth (HOSPITAL_COMMUNITY): Payer: Self-pay | Admitting: Emergency Medicine

## 2020-08-08 MED ORDER — FLUCONAZOLE 150 MG PO TABS: 150.0000 mg | ORAL_TABLET | Freq: Once | ORAL | 0 refills | Status: AC

## 2020-08-22 ENCOUNTER — Other Ambulatory Visit: Payer: Self-pay

## 2020-08-22 ENCOUNTER — Ambulatory Visit (INDEPENDENT_AMBULATORY_CARE_PROVIDER_SITE_OTHER): Payer: No Typology Code available for payment source | Admitting: Obstetrics and Gynecology

## 2020-08-22 ENCOUNTER — Ambulatory Visit: Payer: Self-pay | Admitting: Obstetrics and Gynecology

## 2020-08-22 ENCOUNTER — Encounter: Payer: Self-pay | Admitting: Obstetrics and Gynecology

## 2020-08-22 VITALS — BP 110/72 | HR 79 | Ht 72.0 in | Wt 241.0 lb

## 2020-08-22 DIAGNOSIS — Z8619 Personal history of other infectious and parasitic diseases: Secondary | ICD-10-CM | POA: Diagnosis not present

## 2020-08-22 NOTE — Patient Instructions (Signed)
Trichomoniasis Trichomoniasis is an STI (sexually transmitted infection) that can affect both women and men. In women, the outer area of the female genitalia (vulva) and the vagina are affected. In men, mainly the penis is affected, but the prostate and other reproductive organs can also be involved.  This condition can be treated with medicine. It often has no symptoms (is asymptomatic), especially in men. If not treated, trichomoniasis can last for months or years. What are the causes? This condition is caused by a parasite called Trichomonas vaginalis. Trichomoniasis most often spreads from person to person (is contagious) through sexual contact. What increases the risk? The following factors may make you more likely to develop this condition:  Having unprotected sex.  Having sex with a partner who has trichomoniasis.  Having multiple sexual partners.  Having had previous trichomoniasis infections or other STIs. What are the signs or symptoms? In women, symptoms of trichomoniasis include:  Abnormal vaginal discharge that is clear, white, gray, or yellow-green and foamy and has an unusual "fishy" odor.  Itching and irritation of the vagina and vulva.  Burning or pain during urination or sex.  Redness and swelling of the genitals. In men, symptoms of trichomoniasis include:  Penile discharge that may be foamy or contain pus.  Pain in the penis. This may happen only when urinating.  Itching or irritation inside the penis.  Burning after urination or ejaculation. How is this diagnosed? In women, this condition may be found during a routine Pap test or physical exam. It may be found in men during a routine physical exam. Your health care provider may do tests to help diagnose this infection, such as:  Urine tests (men and women).  The following in women: ? Testing the pH of the vagina. ? A vaginal swab test that checks for the Trichomonas vaginalis parasite. ? Testing vaginal  secretions. Your health care provider may test you for other STIs, including HIV (human immunodeficiency virus). How is this treated? This condition is treated with medicine taken by mouth (orally), such as metronidazole or tinidazole, to fight the infection. Your sexual partner(s) also need to be tested and treated.  If you are a woman and you plan to become pregnant or think you may be pregnant, tell your health care provider right away. Some medicines that are used to treat the infection should not be taken during pregnancy. Your health care provider may recommend over-the-counter medicines or creams to help relieve itching or irritation. You may be tested for infection again 3 months after treatment. Follow these instructions at home:  Take and use over-the-counter and prescription medicines, including creams, only as told by your health care provider.  Take your antibiotic medicine as told by your health care provider. Do not stop taking the antibiotic even if you start to feel better.  Do not have sex until 7-10 days after you finish your medicine, or until your health care provider approves. Ask your health care provider when you may start to have sex again.  (Women) Do not douche or wear tampons while you have the infection.  Discuss your infection with your sexual partner(s). Make sure that your partner gets tested and treated, if necessary.  Keep all follow-up visits as told by your health care provider. This is important. How is this prevented?   Use condoms every time you have sex. Using condoms correctly and consistently can help protect against STIs.  Avoid having multiple sexual partners.  Talk with your sexual partner about any   symptoms that either of you may have, as well as any history of STIs.  Get tested for STIs and STDs (sexually transmitted diseases) before you have sex. Ask your partner to do the same.  Do not have sexual contact if you have symptoms of  trichomoniasis or another STI. Contact a health care provider if:  You still have symptoms after you finish your medicine.  You develop pain in your abdomen.  You have pain when you urinate.  You have bleeding after sex.  You develop a rash.  You feel nauseous or you vomit.  You plan to become pregnant or think you may be pregnant. Summary  Trichomoniasis is an STI (sexually transmitted infection) that can affect both women and men.  This condition often has no symptoms (is asymptomatic), especially in men.  Without treatment, this condition can last for months or years.  You should not have sex until 7-10 days after you finish your medicine, or until your health care provider approves. Ask your health care provider when you may start to have sex again.  Discuss your infection with your sexual partner(s). Make sure that your partner gets tested and treated, if necessary. This information is not intended to replace advice given to you by your health care provider. Make sure you discuss any questions you have with your health care provider. Document Revised: 07/21/2018 Document Reviewed: 07/21/2018 Elsevier Patient Education  2020 [employer]  

## 2020-08-22 NOTE — Progress Notes (Signed)
GYNECOLOGY  VISIT   HPI: 34 y.o.   Married Black or [place] American Not Hispanic or Latino  female   G0P0000 with Patient's last menstrual period was 07/20/2020.   here for follow up test of cure. She had trich on her pap last month. Negative GC/CT/HIV/RPR/HepB/HepC. She and her partner were both treated. No symptoms.   GYNECOLOGIC HISTORY: Patient's last menstrual period was 07/20/2020. Contraception:condoms  Menopausal hormone therapy: none         OB History    Gravida  0   Para  0   Term  0   Preterm  0   AB  0   Living  0     SAB  0   TAB  0   Ectopic  0   Multiple  0   Live Births                 Patient Active Problem List   Diagnosis Date Noted  . Headache 07/21/2019  . IDA (iron deficiency anemia) 04/02/2017  . Right optic neuritis 07/27/2015  . Numbness and tingling 06/10/2014  . Low back pain 01/20/2013  . Multiple sclerosis (HCC) 01/12/2013    Past Medical History:  Diagnosis Date  . Anemia   . Low back pain 01/20/2013  . MS (multiple sclerosis) (HCC)   . Multiple sclerosis (HCC) 01/12/2013  . Trichomoniasis     Past Surgical History:  Procedure Laterality Date  . NONE      Current Outpatient Medications  Medication Sig Dispense Refill  . Fingolimod HCl (GILENYA) 0.5 MG CAPS TAKE 1 CAPSULE (0.5MG  TOTAL) BY MOUTH DAILY 30 capsule 11  . gabapentin (NEURONTIN) 400 MG capsule Take 1 capsule (400 mg total) by mouth 3 (three) times daily. 270 capsule 4   No current facility-administered medications for this visit.   Facility-Administered Medications Ordered in Other Visits  Medication Dose Route Frequency Provider Last Rate Last Admin  . gadopentetate dimeglumine (MAGNEVIST) injection 20 mL  20 mL Intravenous Once PRN Butch Penny, NP         ALLERGIES: Patient has no known allergies.  Family History  Problem Relation Age of Onset  . Healthy Mother   . Hypertension Maternal Grandmother   . Diabetes Maternal Grandmother   .  Hypertension Paternal Grandmother     Social History   Socioeconomic History  . Marital status: Married    Spouse name: Not on file  . Number of children: 0  . Years of education: 83  . Highest education level: Not on file  Occupational History    Comment: [employer]  Tobacco Use  . Smoking status: Never Smoker  . Smokeless tobacco: Never Used  Vaping Use  . Vaping Use: Never used  Substance and Sexual Activity  . Alcohol use: No  . Drug use: No  . Sexual activity: Yes    Partners: Male    Birth control/protection: None  Other Topics Concern  . Not on file  Social History Narrative   Patient is working at [employer] time., lives with her husband, no children.   Patient is right handed.   Patient has a high school education and some college.   Patient does not drink caffeine.   Social Determinants of Health   Financial Resource Strain:   . Difficulty of Paying Living Expenses: Not on file  Food Insecurity:   . Worried About Programme researcher, broadcasting/film/video in the Last Year: Not on file  .  Ran Out of Food in the Last Year: Not on file  Transportation Needs:   . Lack of Transportation (Medical): Not on file  . Lack of Transportation (Non-Medical): Not on file  Physical Activity:   . Days of Exercise per Week: Not on file  . Minutes of Exercise per Session: Not on file  Stress:   . Feeling of Stress : Not on file  Social Connections:   . Frequency of Communication with Friends and Family: Not on file  . Frequency of Social Gatherings with Friends and Family: Not on file  . Attends Religious Services: Not on file  . Active Member of Clubs or Organizations: Not on file  . Attends Banker Meetings: Not on file  . Marital Status: Not on file  Intimate Partner Violence:   . Fear of Current or Ex-Partner: Not on file  . Emotionally Abused: Not on file  . Physically Abused: Not on file  . Sexually Abused: Not on file    Review of Systems  All  other systems reviewed and are negative.   PHYSICAL EXAMINATION:    BP 110/72   Pulse 79   Ht 6' (1.829 m)   Wt 241 lb (109.3 kg)   LMP 07/20/2020   SpO2 99%   BMI 32.69 kg/m     General appearance: alert, cooperative and appears stated age  Pelvic: External genitalia:  no lesions              Urethra:  normal appearing urethra with no masses, tenderness or lesions              Bartholins and Skenes: normal                 Vagina: normal appearing vagina with normal color and discharge, no lesions              Cervix: no lesions               Chaperone was present for exam.  ASSESSMENT H/O trich, s/p treatment    PLAN Test of cure sent Information on trich in [place] visit summary

## 2020-08-24 LAB — TRICHOMONAS VAGINALIS, PROBE AMP: Trich vag by NAA: NEGATIVE

## 2020-09-11 NOTE — Progress Notes (Signed)
I have reviewed and agreed above plan.   Reviewed MRI of the brain with and without contrast in October 2020, stable MS lesions, agree with Gilenya

## 2020-09-12 ENCOUNTER — Telehealth: Payer: Self-pay | Admitting: *Deleted

## 2020-09-12 NOTE — Telephone Encounter (Signed)
Signed, placed in mail per pt request.

## 2020-09-12 NOTE — Telephone Encounter (Signed)
Received [employer] PAP form for [employer].  Completed.  To SS/NP for signature.

## 2020-11-02 ENCOUNTER — Telehealth: Payer: Self-pay | Admitting: Neurology

## 2020-11-02 NOTE — Telephone Encounter (Addendum)
CMM KEY [id number] initiated for gilenya 0.5mg  po daily. [facility] [id number] RxBIN [id number] PCN AVD RX [id number] 11-14-20.

## 2020-11-02 NOTE — Telephone Encounter (Signed)
[employer] [place]) called, PA is needed for this patient for 2022. We do not have PA on file.  Contact info: [phone]

## 2020-11-02 NOTE — Telephone Encounter (Signed)
Ezequiel Essex from [employer] called to state that we fax PA to [employer]. Their phone number is [phone] & fax number is [phone]. She asks that we put [employer] employee when sending the fax. She states once this is completed, their  fax number is [phone] & their phone number is [phone].

## 2020-11-16 NOTE — Telephone Encounter (Signed)
Received fax APPROVAL for Gilenya 0.5mg  caps 11-15-20 thru 11-15-2021 thru [facility]. [facility] # [facility] [phone] [id number].  Filled thru [facility].  Faxed confirmation to [employer] PAP [phone], [phone]  ([facility] [id number] ADV RX Bin [id number] RX PCN AVD, RX Grp [id number].

## 2021-01-10 ENCOUNTER — Encounter: Payer: Self-pay | Admitting: Obstetrics and Gynecology

## 2021-01-10 ENCOUNTER — Other Ambulatory Visit: Payer: Self-pay

## 2021-01-10 ENCOUNTER — Ambulatory Visit (INDEPENDENT_AMBULATORY_CARE_PROVIDER_SITE_OTHER): Payer: No Typology Code available for payment source | Admitting: Obstetrics and Gynecology

## 2021-01-10 VITALS — BP 110/66 | HR 66 | Ht 71.0 in | Wt 238.0 lb

## 2021-01-10 DIAGNOSIS — N760 Acute vaginitis: Secondary | ICD-10-CM

## 2021-01-10 DIAGNOSIS — B373 Candidiasis of vulva and vagina: Secondary | ICD-10-CM

## 2021-01-10 DIAGNOSIS — B3731 Acute candidiasis of vulva and vagina: Secondary | ICD-10-CM

## 2021-01-10 LAB — WET PREP FOR TRICH, YEAST, CLUE

## 2021-01-10 MED ORDER — BETAMETHASONE VALERATE 0.1 % EX OINT: 1.0000 "application " | TOPICAL_OINTMENT | Freq: Two times a day (BID) | CUTANEOUS | 0 refills | Status: DC

## 2021-01-10 MED ORDER — FLUCONAZOLE 150 MG PO TABS: 150.0000 mg | ORAL_TABLET | Freq: Once | ORAL | 0 refills | Status: AC

## 2021-01-10 NOTE — Progress Notes (Signed)
GYNECOLOGY  VISIT   HPI: 35 y.o.   Married Black or [place] American Not Hispanic or Latino  female   G0P0000 with Patient's last menstrual period was 12/16/2020 (approximate).   here for Patient states that she has vaginal irritation. She is having thick white discharge.   Symptoms started 2 weeks ago. She has vaginal irritation, no itching, no odor. The d/c is thick and clumpy.   GYNECOLOGIC HISTORY: Patient's last menstrual period was 12/16/2020 (approximate). Contraception:none Menopausal hormone therapy: none         OB History    Gravida  0   Para  0   Term  0   Preterm  0   AB  0   Living  0     SAB  0   IAB  0   Ectopic  0   Multiple  0   Live Births                 Patient Active Problem List   Diagnosis Date Noted  . Headache 07/21/2019  . IDA (iron deficiency anemia) 04/02/2017  . Right optic neuritis 07/27/2015  . Numbness and tingling 06/10/2014  . Low back pain 01/20/2013  . Multiple sclerosis (HCC) 01/12/2013    Past Medical History:  Diagnosis Date  . Anemia   . Low back pain 01/20/2013  . MS (multiple sclerosis) (HCC)   . Multiple sclerosis (HCC) 01/12/2013  . Trichomoniasis     Past Surgical History:  Procedure Laterality Date  . NONE      Current Outpatient Medications  Medication Sig Dispense Refill  . Fingolimod HCl (GILENYA) 0.5 MG CAPS TAKE 1 CAPSULE (0.5MG  TOTAL) BY MOUTH DAILY 30 capsule 11  . gabapentin (NEURONTIN) 400 MG capsule Take 1 capsule (400 mg total) by mouth 3 (three) times daily. 270 capsule 4   No current facility-administered medications for this visit.   Facility-Administered Medications Ordered in Other Visits  Medication Dose Route Frequency Provider Last Rate Last Admin  . gadopentetate dimeglumine (MAGNEVIST) injection 20 mL  20 mL Intravenous Once PRN Butch Penny, NP         ALLERGIES: Patient has no known allergies.  Family History  Problem Relation Age of Onset  . Healthy Mother   .  Hypertension Maternal Grandmother   . Diabetes Maternal Grandmother   . Hypertension Paternal Grandmother     Social History   Socioeconomic History  . Marital status: Married    Spouse name: Not on file  . Number of children: 0  . Years of education: 28  . Highest education level: Not on file  Occupational History    Comment: [employer]  Tobacco Use  . Smoking status: Never Smoker  . Smokeless tobacco: Never Used  Vaping Use  . Vaping Use: Never used  Substance and Sexual Activity  . Alcohol use: No  . Drug use: No  . Sexual activity: Yes    Partners: Male    Birth control/protection: None  Other Topics Concern  . Not on file  Social History Narrative   Patient is working at [employer] time., lives with her husband, no children.   Patient is right handed.   Patient has a high school education and some college.   Patient does not drink caffeine.   Social Determinants of Health   Financial Resource Strain: Not on file  Food Insecurity: Not on file  Transportation Needs: Not on file  Physical Activity: Not on file  Stress: Not on file  Social Connections: Not on file  Intimate Partner Violence: Not on file    Review of Systems  All other systems reviewed and are negative.   PHYSICAL EXAMINATION:    BP 110/66   Pulse 66   Ht 5\' 11"  (1.803 m)   Wt 238 lb (108 kg)   LMP 12/16/2020 (Approximate)   SpO2 99%   BMI 33.19 kg/m     General appearance: alert, cooperative and appears stated age  Pelvic: External genitalia:  no lesions, mild erythema              Urethra:  normal appearing urethra with no masses, tenderness or lesions              Bartholins and Skenes: normal                 Vagina: mildly erythematous appearing vagina with a slight increase in watery, white vaginal d/c and a slight amount of thicker white vaginal discharge              Cervix: no lesions               Vaginal pH: 5  1. Vulvovaginitis On exam findings c/w  BV and yeast, elevated pH - WET PREP FOR TRICH, YEAST, CLUE: + for yeast only - betamethasone valerate ointment (VALISONE) 0.1 %; Apply 1 application topically 2 (two) times daily. Can use for up to 1-2 weeks as needed.  Dispense: 15 g; Refill: 0 - Bacterial Vaginosis DNA  2. Yeast vaginitis - fluconazole (DIFLUCAN) 150 MG tablet; Take 1 tablet (150 mg total) by mouth once for 1 dose. Take one tablet.  Repeat in 72 hours if symptoms are not completely resolved.  Dispense: 2 tablet; Refill: 0

## 2021-01-10 NOTE — Patient Instructions (Signed)
Vaginitis  Vaginitis is a condition in which the vaginal tissue swells and becomes irritated. This condition is most often caused by a change in the normal balance of bacteria and yeast that live in the vagina. This change causes an overgrowth of certain bacteria or yeast, which causes the inflammation. There are different types of vaginitis. What are the causes? The cause of this condition depends on the type of vaginitis. It can be caused by:  Bacteria (bacterial vaginosis).  Yeast, which is a fungus (candidiasis).  A parasite (trichomoniasis vaginitis).  A virus (viral vaginitis).  Low hormone levels (atrophic vaginitis). Low hormone levels can occur during pregnancy, breastfeeding, or after menopause.  Irritants, such as bubble baths, scented tampons, and feminine sprays (allergic vaginitis). Other factors can change the normal balance of the yeast and bacteria that live in the vagina. These include:  Antibiotic medicines.  Poor hygiene.  Diaphragms, vaginal sponges, spermicides, birth control pills, and intrauterine devices (IUDs).  Sex.  Infection.  Uncontrolled diabetes.  A weakened body defense system (immune system). What increases the risk? This condition is more likely to develop in women who:  Smoke or are exposed to secondhand smoke.  Use vaginal douches, scented tampons, or scented sanitary pads.  Wear tight-fitting pants or thong underwear.  Use oral birth control pills or an IUD.  Have sex without a condom or have multiple partners.  Have an STI.  Frequently use the spermicide nonoxynol-9.  Eat lots of foods high in sugar or who have uncontrolled diabetes.  Have low estrogen levels.  Have a weakened immune system from an immune disorder or medical treatment.  Are pregnant or breastfeeding. What are the signs or symptoms? Symptoms vary depending on the cause of the vaginitis. Common symptoms include:  Abnormal vaginal discharge. ? The  discharge is white, gray, or yellow with bacterial vaginosis. ? The discharge is thick, white, and cheesy with a yeast infection. ? The discharge is frothy and yellow or greenish with trichomoniasis.  A bad vaginal smell. The smell is fishy with bacterial vaginosis.  Vaginal itching, pain, or swelling.  Pain with sex.  Pain or burning when urinating. Sometimes there are no symptoms. How is this diagnosed? This condition is diagnosed based on your symptoms and medical history. A physical exam, including a pelvic exam, will also be done. You may also have other tests, including:  Tests to determine the pH level (acidity or alkalinity) of your vagina.  A whiff test to assess the odor that results when a sample of your vaginal discharge is mixed with a potassium hydroxide solution.  Tests of vaginal fluid. A sample will be examined under a microscope. How is this treated? Treatment varies depending on the type of vaginitis you have. Your treatment may include:  Antibiotic creams or pills to treat bacterial vaginosis and trichomoniasis.  Antifungal medicines, such as vaginal creams or suppositories, to treat a yeast infection.  Medicine to ease discomfort if you have viral vaginitis. Your sexual partner should also be treated.  Estrogen delivered in a cream, pill, suppository, or vaginal ring to treat atrophic vaginitis. If vaginal dryness occurs, lubricants and moisturizing creams may help. You may need to avoid scented soaps, sprays, or douches.  Stopping use of a product that is causing allergic vaginitis and then using a vaginal cream to treat the symptoms. Follow these instructions at home: Lifestyle  Keep your genital area clean and dry. Avoid soap, and only rinse the area with water.  Do not douche   or use tampons until your health care provider says it is okay. Use sanitary pads, if needed.  Do not have sex until your health care provider approves. When you can return to sex,  practice safe sex and use condoms.  Wipe from front to back. This avoids the spread of bacteria from the rectum to the vagina. General instructions  Take over-the-counter and prescription medicines only as told by your health care provider.  If you were prescribed an antibiotic medicine, take or use it as told by your health care provider. Do not stop taking or using the antibiotic even if you start to feel better.  Keep all follow-up visits. This is important. How is this prevented?  Use mild, unscented products. Do not use things that can irritate the vagina, such as fabric softeners. Avoid the following products if they are scented: ? Feminine sprays. ? Detergents. ? Tampons. ? Feminine hygiene products. ? Soaps or bubble baths.  Let air reach your genital area. To do this: ? Wear cotton underwear to reduce moisture buildup. ? Avoid wearing underwear while you sleep. ? Avoid wearing tight pants and underwear or nylons without a cotton panel. ? Avoid wearing thong underwear.  Take off any wet clothing, such as bathing suits, as soon as possible.  Practice safe sex and use condoms. Contact a health care provider if:  You have abdominal or pelvic pain.  You have a fever or chills.  You have symptoms that last for more than 2-3 days. Get help right away if:  You have a fever and your symptoms suddenly get worse. Summary  Vaginitis is a condition in which the vaginal tissue becomes inflamed.This condition is most often caused by a change in the normal balance of bacteria and yeast that live in the vagina.  Treatment varies depending on the type of vaginitis you have.  Do not douche, use tampons, or have sex until your health care provider approves. When you can return to sex, practice safe sex and use condoms. This information is not intended to replace advice given to you by your health care provider. Make sure you discuss any questions you have with your health care  provider. Document Revised: 04/06/2020 Document Reviewed: 04/06/2020 Elsevier Patient Education  2021 Elsevier Inc.  

## 2021-01-15 LAB — BACTERIAL VAGINOSIS DNA
Atopobium vaginae: NOT DETECTED Log (cells/mL)
Gardnerella vaginalis: NOT DETECTED Log (cells/mL)
LACTOBACILLUS SPECIES: 6 Log (cells/mL)
MEGASPHAERA SPECIES: NOT DETECTED Log (cells/mL)

## 2021-01-18 ENCOUNTER — Encounter: Payer: Self-pay | Admitting: Neurology

## 2021-01-30 NOTE — Telephone Encounter (Signed)
FMLA form completed signed by SS/NP and faxed to [phone] received fax confirmation.  Pt made aware copy to MR.

## 2021-01-31 ENCOUNTER — Ambulatory Visit: Payer: No Typology Code available for payment source | Admitting: Neurology

## 2021-02-15 NOTE — Progress Notes (Signed)
GYNECOLOGY  VISIT  CC:   Positive Pregnancy test, has been trying x 12 years  HPI: 35 y.o. G0P0000 Married [place] or [place] American female here for pregnancy confirmation.  Irregular menses, she didn't suspect pregnancy at first. Noticed breast tenderness x 4 weeks. Took pregnancy test 02/12/2021  Takes one a day prenatal vitamin  Has MS, regular medications are Finolimod and neurontin   Period Duration (Days): 5 Period Pattern: (!) Irregular Menstrual Flow:  (random) Menstrual Control: Tampon,Maxi pad Dysmenorrhea:  (occ)  GYNECOLOGIC HISTORY: Patient's last menstrual period was 12/04/2020 (exact date). Contraception: none Menopausal hormone therapy: none  Patient Active Problem List   Diagnosis Date Noted  . Headache 07/21/2019  . IDA (iron deficiency anemia) 04/02/2017  . Right optic neuritis 07/27/2015  . Numbness and tingling 06/10/2014  . Low back pain 01/20/2013  . Multiple sclerosis (HCC) 01/12/2013    Past Medical History:  Diagnosis Date  . Anemia   . Low back pain 01/20/2013  . MS (multiple sclerosis) (HCC)   . Multiple sclerosis (HCC) 01/12/2013  . Trichomoniasis     Past Surgical History:  Procedure Laterality Date  . NONE      MEDS:   Current Outpatient Medications on File Prior to Visit  Medication Sig Dispense Refill  . Fingolimod HCl (GILENYA) 0.5 MG CAPS TAKE 1 CAPSULE (0.5MG  TOTAL) BY MOUTH DAILY 30 capsule 11  . gabapentin (NEURONTIN) 400 MG capsule Take 1 capsule (400 mg total) by mouth 3 (three) times daily. 270 capsule 4   Current Facility-Administered Medications on File Prior to Visit  Medication Dose Route Frequency Provider Last Rate Last Admin  . gadopentetate dimeglumine (MAGNEVIST) injection 20 mL  20 mL Intravenous Once PRN Butch Penny, NP        ALLERGIES: Patient has no known allergies.  Family History  Problem Relation Age of Onset  . Healthy Mother   . Hypertension Maternal Grandmother   . Diabetes Maternal  Grandmother   . Hypertension Paternal Grandmother      Review of Systems  Constitutional:       Nausea, headaches  HENT: Negative.   Eyes: Negative.   Respiratory: Negative.   Cardiovascular: Negative.   Gastrointestinal: Negative.   Endocrine: Negative.   Genitourinary: Negative.   Musculoskeletal: Negative.   Skin:       Breast tenderness, some stomach cramping-no bleeding  Allergic/Immunologic: Negative.   Neurological: Negative.   Psychiatric/Behavioral: Negative.     PHYSICAL EXAMINATION:    BP 118/74   Pulse 70   Resp 16   Wt 239 lb (108.4 kg)   LMP 12/04/2020 (Exact Date)   BMI 33.33 kg/m     General appearance: alert, cooperative, no acute distress Palpated suprapubic area, not tender  Assessment: Positive pregnancy test LMP 12/04/2020 ~10 weeks 4 days by LMP ~EDC Sep 10, 2021  Plan: Establish care woth OB/GYN provider ASAP Contact neurologist ASAP regarding current medicaitons [facility] Discussed avoidance of alcohol

## 2021-02-16 ENCOUNTER — Other Ambulatory Visit: Payer: Self-pay

## 2021-02-16 ENCOUNTER — Encounter: Payer: Self-pay | Admitting: Nurse Practitioner

## 2021-02-16 ENCOUNTER — Ambulatory Visit (INDEPENDENT_AMBULATORY_CARE_PROVIDER_SITE_OTHER): Payer: No Typology Code available for payment source | Admitting: Nurse Practitioner

## 2021-02-16 VITALS — BP 118/74 | HR 70 | Resp 16 | Wt 239.0 lb

## 2021-02-16 DIAGNOSIS — N912 Amenorrhea, unspecified: Secondary | ICD-10-CM | POA: Diagnosis not present

## 2021-02-16 DIAGNOSIS — Z3201 Encounter for pregnancy test, result positive: Secondary | ICD-10-CM | POA: Diagnosis not present

## 2021-02-16 LAB — PREGNANCY, URINE: Preg Test, Ur: POSITIVE — AB

## 2021-02-16 MED ORDER — PRENATAL VITAMIN 27-0.8 MG PO TABS: 1.0000 | ORAL_TABLET | Freq: Every day | ORAL | 12 refills | Status: AC

## 2021-02-16 NOTE — Patient Instructions (Addendum)
Commonly Asked Questions During Pregnancy  Cats: A parasite can be excreted in cat feces.  To avoid exposure you need to have another person empty the little box.  If you must empty the litter box you will need to wear gloves.  Wash your hands after handling your cat.  This parasite can also be found in raw or undercooked meat so this should also be avoided.  Colds, Sore Throats, Flu: Please check your medication sheet to see what you can take for symptoms.  If your symptoms are unrelieved by these medications please call the office.  Dental Work: Most any dental work [employer] recommends is permitted.  X-rays should only be taken during the first trimester if absolutely necessary.  Your abdomen should be shielded with a lead apron during all x-rays.  Please notify your provider prior to receiving any x-rays.  Novocaine is fine; gas is not recommended.  If your dentist requires a note from [place] prior to dental work please call the office and we will provide one for you.  Exercise: Exercise is an important part of staying healthy during your pregnancy.  You may continue most exercises you were accustomed to prior to pregnancy.  Later in your pregnancy you will most likely notice you have difficulty with activities requiring balance like riding a bicycle.  It is important that you listen to your body and avoid activities that put you at a higher risk of falling.  Adequate rest and staying well hydrated are a must!  If you have questions about the safety of specific activities ask your provider.    Exposure to Children with illness: Try to avoid obvious exposure; report any symptoms to [place] when noted,  If you have chicken pos, red measles or mumps, you should be immune to these diseases.   Please do not take any vaccines while pregnant unless you have checked with your OB provider.  Fetal Movement: After 28 weeks we recommend you do "kick counts" twice daily.  Lie or sit down in a calm quiet environment  and count your baby movements "kicks".  You should feel your baby at least 10 times per hour.  If you have not felt 10 kicks within the first hour get up, walk around and have something sweet to eat or drink then repeat for an additional hour.  If count remains less than 10 per hour notify your provider.  Fumigating: Follow your pest control agent's advice as to how long to stay out of your home.  Ventilate the area well before re-entering.  Hemorrhoids:   Most over-the-counter preparations can be used during pregnancy.  Check your medication to see what is safe to use.  It is important to use a stool softener or fiber in your diet and to drink lots of liquids.  If hemorrhoids seem to be getting worse please call the office.   Hot Tubs:  Hot tubs Jacuzzis and saunas are not recommended while pregnant.  These increase your internal body temperature and should be avoided.  Intercourse:  Sexual intercourse is safe during pregnancy as long as you are comfortable, unless otherwise advised by your provider.  Spotting may occur after intercourse; report any bright red bleeding that is heavier than spotting.  Labor:  If you know that you are in labor, please go to the hospital.  If you are unsure, please call the office and let us help you decide what to do.  Lifting, straining, etc:  If your job requires heavy  lifting or straining please check with your provider for any limitations.  Generally, you should not lift items heavier than that you can lift simply with your hands and arms (no back muscles)  Painting:  Paint fumes do not harm your pregnancy, but may make you ill and should be avoided if possible.  Latex or water based paints have less odor than oils.  Use adequate ventilation while painting.  Permanents & Hair Color:  Chemicals in hair dyes are not recommended as they cause increase hair dryness which can increase hair loss during pregnancy.  " Highlighting" and permanents are allowed.  Dye may be  absorbed differently and permanents may not hold as well during pregnancy.  Sunbathing:  Use a sunscreen, as skin burns easily during pregnancy.  Drink plenty of fluids; avoid over heating.  Tanning Beds:  Because their possible side effects are still unknown, tanning beds are not recommended.  Ultrasound Scans:  Routine ultrasounds are performed at approximately 20 weeks.  You will be able to see your baby's general anatomy an if you would like to know the gender this can usually be determined as well.  If it is questionable when you conceived you may also receive an ultrasound early in your pregnancy for dating purposes.  Otherwise ultrasound exams are not routinely performed unless there is a medical necessity.  Although you can request a scan we ask that you pay for it when conducted because insurance does not cover " patient request" scans.  Work: If your pregnancy proceeds without complications you may work until your due date, unless your physician or employer advises otherwise.  Round Ligament Pain/Pelvic Discomfort:  Sharp, shooting pains not associated with bleeding are fairly common, usually occurring in the second trimester of pregnancy.  They tend to be worse when standing up or when you remain standing for long periods of time.  These are the result of pressure of certain pelvic ligaments called "round ligaments".  Rest, Tylenol and heat seem to be the most effective relief.  As the womb and fetus grow, they rise out of the pelvis and the discomfort improves.  Please notify the office if your pain seems different than that described.  It may represent a more serious condition.   Eating Plan for Pregnant Women While you are pregnant, your body requires additional nutrition to help support your growing baby. You also have a higher need for some vitamins and minerals, such as folic acid, calcium, iron, and vitamin D. Eating a healthy, well-balanced diet is very important for your health and  your baby's health. Your need for extra calories varies over the course of your pregnancy. Pregnancy is divided into three trimesters, with each trimester lasting 3 months. For most women, it is recommended to consume:  150 extra calories a day during the first trimester.  300 extra calories a day during the second trimester.  300 extra calories a day during the third trimester. What are tips for following this plan? Cooking  Practice good food safety and cleanliness. Wash your hands before you eat and after you prepare raw meat. Wash all fruits and vegetables well before peeling or eating. Taking these actions can help to prevent foodborne illnesses that can be very dangerous to your baby, such as listeriosis. Ask your health care provider for more information about listeriosis.  Make sure that all meats, poultry, and eggs are cooked to food-safe temperatures or "well-done." Meal planning  Eat a variety of foods (especially fruits and vegetables)  to get a full range of vitamins and minerals.  Two or more servings of fish are recommended each week in order to get the most benefits from omega-3 fatty acids that are found in seafood. Choose fish that are lower in mercury, such as salmon and pollock.  Limit your overall intake of foods that have "empty calories." These are foods that have little nutritional value, such as sweets, desserts, candies, and sugar-sweetened beverages.  Drinks that contain caffeine are okay to drink, but it is better to avoid caffeine. Keep your total caffeine intake to less than 200 mg each day (which is 12 oz or 355 mL of coffee, tea, or soda) or the limit as told by your health care provider.   General information  Do not try to lose weight or go on a diet during pregnancy.  Take a prenatal vitamin to help meet your additional vitamin and mineral needs during pregnancy, specifically for folic acid, iron, calcium, and vitamin D.  Remember to stay active. Ask your  health care provider what types of exercise and activities are safe for you. What does 150 extra calories look like? Healthy options that provide 150 extra calories each day could be any of the following:  6-8 oz (170-227 g) plain low-fat yogurt with  cup (70 g) berries.  1 apple with 2 tsp (11 g) peanut butter.  Cut-up vegetables with  cup (60 g) hummus.  8 fl oz (237 mL) low-fat chocolate milk.  1 stick of string cheese with 1 medium orange.  1 peanut butter and jelly sandwich that is made with one slice of whole-wheat bread and 1 tsp (5 g) of peanut butter. For 300 extra calories, you could eat two of these healthy options each day. What is a healthy amount of weight to gain? The right amount of weight gain for you is based on your BMI (body mass index) before you became pregnant.  If your BMI was less than 18 (underweight), you should gain 28-40 lb (13-18 kg).  If your BMI was 18-24.9 (normal), you should gain 25-35 lb (11-16 kg).  If your BMI was 25-29.9 (overweight), you should gain 15-25 lb (7-11 kg).  If your BMI was 30 or greater (obese), you should gain 11-20 lb (5-9 kg). What if I am having twins or multiples? Generally, if you are carrying twins or multiples:  You may need to eat 300-600 extra calories a day.  The recommended range for total weight gain is 25-54 lb (11-25 kg), depending on your BMI before pregnancy.  Talk with your health care provider to find out about nutritional needs, weight gain, and exercise that is right for you. What foods should I eat? Fruits All fruits. Eat a variety of colors and types of fruit. Remember to wash your fruits well before peeling or eating. Vegetables All vegetables. Eat a variety of colors and types of vegetables. Remember to wash your vegetables well before peeling or eating. Grains All grains. Choose whole grains, such as whole-wheat bread, oatmeal, or brown rice. Meats and other protein foods Lean meats, including  chicken, [place], and lean cuts of beef, veal, or pork. Fish that is higher in omega-3 fatty acids and lower in mercury, such as salmon, herring, mussels, trout, sardines, pollock, shrimp, crab, and lobster. Tofu. Tempeh. Beans. Eggs. Peanut butter and other nut butters. Dairy Pasteurized milk and milk alternatives, such as almond milk. Pasteurized yogurt and pasteurized cheese. Cottage cheese. Sour cream. Beverages Water. Juices that contain 100% fruit  juice or vegetable juice. Caffeine-free teas and decaffeinated coffee. Fats and oils Fats and oils are okay to include in moderation. Sweets and desserts Sweets and desserts are okay to include in moderation. Seasoning and other foods All pasteurized condiments. The items listed above may not be a complete list of foods and beverages you can eat. Contact a dietitian for more information.   What foods should I avoid? Fruits Raw (unpasteurized) fruit juices. Vegetables Unpasteurized vegetable juices. Meats and other protein foods Precooked or cured meat, such as bologna, hot dogs, sausages, or meat loaves. (If you must eat those meats, reheat them until they are steaming hot.) Refrigerated pate, meat spreads from a meat counter, or smoked seafood that is found in the refrigerated section of a store. Raw or undercooked meats, poultry, and eggs. Raw fish, such as sushi or sashimi. Fish that have high mercury content, such as tilefish, shark, swordfish, and king mackerel. Dairy Unpasteurized milk and any foods that have unpasteurized milk in them. Soft cheeses, such as feta, queso blanco, queso fresco, [place], [place], panela, and blue-veined cheeses (unless they are made with pasteurized milk, which must be stated on the label). Beverages Alcohol. Sugar-sweetened beverages, such as sodas, teas, or energy drinks. Seasoning and other foods Homemade fermented foods and drinks, such as pickles, sauerkraut, or kombucha drinks. (Store-bought pasteurized  versions of these are okay.) Salads that are made in a store or deli, such as ham salad, chicken salad, egg salad, tuna salad, and seafood salad. The items listed above may not be a complete list of foods and beverages you should avoid. Contact a dietitian for more information. Where to find more information To calculate the number of calories you need based on your height, weight, and activity level, you can use an online calculator such as:  PayStrike.dk To calculate how much weight you should gain during pregnancy, you can use an online pregnancy weight gain calculator such as:  http://www.harvey.com/ To learn more about eating fish during pregnancy, talk with your health care provider or visit:  PumpkinSearch.com.ee Summary  While you are pregnant, your body requires additional nutrition to help support your growing baby.  Eat a variety of foods, especially fruits and vegetables, to get a full range of vitamins and minerals.  Practice good food safety and cleanliness. Wash your hands before you eat and after you prepare raw meat. Wash all fruits and vegetables well before peeling or eating. Taking these actions can help to prevent foodborne illnesses, such as listeriosis, that can be very dangerous to your baby.  Do not eat raw meat or fish. Do not eat fish that have high mercury content, such as tilefish, shark, swordfish, and king mackerel. Do not eat raw (unpasteurized) dairy.  Take a prenatal vitamin to help meet your additional vitamin and mineral needs during pregnancy, specifically for folic acid, iron, calcium, and vitamin D. This information is not intended to replace advice given to you by your health care provider. Make sure you discuss any questions you have with your health care provider. Document Revised: 05/04/2020 Document Reviewed: 05/04/2020 Elsevier Patient Education  2021 [employer] Pregnancy After Age 57 Women who become pregnant after the age of 1 have a  higher risk for certain problems during pregnancy. This is because older women may already have health problems before becoming pregnant. Older women who are healthy before pregnancy may still develop problems during pregnancy. These problems may affect the mother, the unborn baby (fetus), or both. How does this affect  me? If you are over age 4 and you want to become pregnant or are pregnant, you may have a higher risk of:  Not being able to get pregnant (infertility).  Going into labor early (preterm labor).  Needing surgical delivery of your baby (cesarean delivery, or C-section).  Having complications during pregnancy, such as high blood pressure and other symptoms (preeclampsia).  Having diabetes during pregnancy (gestational diabetes).  Being pregnant with more than one baby.  Loss of the unborn baby before 20 weeks (miscarriage) or after 20 weeks of pregnancy (stillbirth). How does this affect my baby? Babies born to women over the age of 79 have a higher risk for:  Being born early (prematurity).  Low birth weight, which is less than 5 lb, 8 oz (2.5 kg).  Birth defects, such as Down syndrome and cleft palate.  Health complications, including problems with growth and development. Prenatal care All women should see their health care provider before they try to become pregnant. This is especially important for women over the age of 39. Tell your health care provider about:  Any health problems you have.  Any medicines you take.  Any family history of health problems or chromosome-related defects.  Any problems you have had with past surgeries, pregnancies, or deliveries. If you are over age 40 and you plan to become pregnant, start taking a daily multivitamin a month or more before you try to get pregnant. Your multivitamin should contain 400 mcg (micrograms) of folic acid. If you are over age 73 and pregnant, make sure you:  Keep taking your multivitamin unless your  health care provider tells you not to take it.  Keep all follow-up visits. This includes prenatal visits. This is important.  Talk with your health care provider about other prenatal screening tests that you may need. Prenatal tests Screening tests show whether your baby has a higher risk for birth defects than other babies. Screening tests include:  More frequent ultrasound tests to look for markers that indicate a risk for birth defects.  Maternal blood screening. These are blood tests that help to determine your baby's risk for birth defects. Screening tests do not show whether your baby has or does not have birth defects. They only show your baby's risk for certain birth defects. If your screening tests show that risk factors are present, you may need tests to confirm the birth defect (diagnostic testing). These tests may include:  Chorionic villus sampling. For this procedure, a tissue sample is taken from the organ that forms in your uterus to nourish your baby (placenta). The sample is removed through your cervix or abdomen and tested.  Amniocentesis. For this procedure, a small amount of the fluid that surrounds the baby in the uterus (amniotic fluid) is removed and tested. You will also need screenings tests for gestational diabetes in the first trimester, as well as later in pregnancy. Staying healthy during pregnancy Staying healthy during pregnancy can help you and your baby to have a lower risk for problems during pregnancy, during delivery, or both. Talk with your health care provider for specific instructions about staying healthy during your pregnancy. General tips Nutrition  At each meal, eat a variety of foods from each of the five food groups. These groups include: ? Proteins such as lean meats, poultry, fish that is low in fat, beans, eggs, and nuts. ? Vegetables such as leafy greens, raw and cooked vegetables, and vegetable juice. ? Fruits that are fresh, frozen, or  canned, or  100% fruit juice. ? Dairy products such as low-fat yogurt, cheese, and milk. ? Whole grains including rice, cereal, pasta, and bread.  Follow instructions from your health care provider about eating and drinking restrictions during pregnancy. ? Do not eat raw eggs, raw meat, or raw fish or seafood. ? Do not eat any fish that contains high amounts of mercury, such as swordfish or mackerel.   Managing weight gain  Ask your health care provider how much weight gain is healthy during pregnancy.  Stay at a healthy weight. If needed, work with your health care provider to lose weight safely.  Exercise regularly, as directed by your health care provider. Ask your health care provider what forms of exercise are safe for you. Follow these instructions at home:  Do not use any products that contain nicotine or tobacco. These products include cigarettes, chewing tobacco, and vaping devices, such as e-cigarettes. If you need help quitting, ask your health care provider.  Drink enough fluid to keep your urine pale yellow.  Do not drink alcohol, use drugs, or abuse prescription medicine.  Take over-the-counter and prescription medicines only as told by your health care provider.  Do not use hot tubs, steam rooms, or saunas.  Talk with your health care provider about your risk of exposure to harmful environmental conditions. This includes exposure to chemicals, radiation, cleaning products, and cat feces. Follow advice from your health care provider about how to limit your exposure. Summary  Women who become pregnant after the age of 53 have a higher risk for complications during pregnancy.  Problems may affect the mother, the unborn baby (fetus), or both.  All women should see their health care provider before they try to become pregnant. This is especially important for women over the age of 45.  Staying healthy during pregnancy can help both you and your baby to have a lower risk  for some of the problems that can happen during pregnancy, during delivery, or both. This information is not intended to replace advice given to you by your health care provider. Make sure you discuss any questions you have with your health care provider. Document Revised: 06/26/2020 Document Reviewed: 06/26/2020 Elsevier Patient Education  2021 [employer].

## 2021-02-20 ENCOUNTER — Encounter: Payer: Self-pay | Admitting: Neurology

## 2021-02-20 ENCOUNTER — Ambulatory Visit (INDEPENDENT_AMBULATORY_CARE_PROVIDER_SITE_OTHER): Payer: No Typology Code available for payment source | Admitting: Neurology

## 2021-02-20 VITALS — BP 129/80 | HR 80 | Ht 71.0 in | Wt 237.0 lb

## 2021-02-20 DIAGNOSIS — G35 Multiple sclerosis: Secondary | ICD-10-CM

## 2021-02-20 NOTE — Progress Notes (Signed)
PATIENT: Carol [name] DOB: 03-15-1986  REASON FOR VISIT: follow up HISTORY FROM: patient  HISTORY OF PRESENT ILLNESS: Today 02/20/21  HISTORY   Ms. Stuller is a 35 year old female with a history of relapse remitting multiple sclerosis.  She wasenrolled in [employer], randomized to Gilenya,sinceMarch 5th 2014.   In March 2013, she developed new-onset right eye pain and blurred vision. She was initially diagnosed with corneal abrasion. She went to the emergency room twice for this. She then followed up with ophthalmology, who ordered MRI of the brain and orbits. This showed acute right optic neuritis and 7-8 chronicdemyelinatingplaques. She was diagnosed with probable multiple sclerosis   Around beginning of December 2013, patient developed new symptoms of numbness in her bilateral fingertips and bilateral toes, lasted for 2 weeks.  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 Patient also has intermittent muscle spasms and balance difficulty. No family history of MS.  MRI brain showed approximately six lesions in the brain which are consistent with multiple sclerosis. there is abnormality in the right optic nerve consistent with multiple sclerosis, see below report. No enhancing lesions are identified.  MRI ORBITSin April 2013: Mild swelling of hyperintensity in the right optic nerve is present.Showed abnormal enhancement with slight stranding in the surrounding orbital fat suggesting acute inflammation. Enhancement is most prominent within the optic canal on the right. Optic chiasm is normal. The left optic nerve is normal.  She remains on Gilenya and is tolerating it well. She denies any new numbness or weakness. She continues to report numbness in the hands and lower extremity. She states occasionally she'll have burning and tingling pain in the right leg. The  symptoms are intermittent and has occurred before. She denies any changes with her gait or balance. Denies any changes with her vision. No change in her mood or behavior. Her last MRI of the brain was in 2017. At that time he did show 2 new lesions however the patient had been off her Gilenya for 5 months. The patient also sees a hematologist for anemia. She denies any new neurological symptoms. She returns today for an evaluation.  Update 03/16/2019 GE:XBMWU was no significant worsening of her MS symptoms, she continue have intermittent bilateral upper and lower extremity paresthesia, taking gabapentin 400 mg 3 times a day,  She denies visual loss, no significant gait abnormality, has been taking Gilenya since 2014, tolerating it well,  I personally reviewed MRI brain in Oct 2018:Multiple round and ovoid periventricular and subcortical foci of chronic demyelinating plaque, no change compared to scan in November 2019  She presented to emergency room on January 07, 2019 for fever, 104, whole body achy pain, cough, chest x-ray showed low lung volumes with bibasilar atelectasis, infiltrates, patient symptom last for 1 week, now improved, per patient, COVID-19 RNA testing was negative, but I do not find the report  Laboratory evaluations showed hemoglobin of 12.6, MCV of 75.4, platelet was 132, BMP showed potassium of 3.3, glucose was elevated 115,  Update July 21, 2019 XL:KGMW today complaining of new problem of daily headache x1 week. She says the pain has been persistent, describes the location as generalized, feels like her head is "booming".She denies any changes to her vision, or nausea/vomiting. She says the pain is 8/10.She will take a Goody powder tablet with relief. She has continued to work. She denies any new numbness or weakness in her arms or legs. She  denies any changes to her bowels or bladder. She indicates she has been sleeping well. She wonders if her headache could  be related to the change in weather/allergies. She does not have history of headache in the past. She remains on gabapentin for paresthesia in bilateral hands and muscle spasms. She has not had recent change in gait or falls. She had a recent eye exam while taking Gilenya and reports it is normal (10 days ago).  Update January 27 2020 SS: When last seen,  MRI of the brain with and without contrast was done, due to report of new onset headache, did not show acute findings, MRI stable from prior in October 2018.  No further headaches.  She remains on Gilenya, remains stable.  She has paresthesia in bilateral hands, and muscle spasms, symptoms are well controlled with gabapentin.  She denies any new problems.  No falls her changes to balance.  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  Needs her FMLA papers redone, due to her company switching systems.  She presents today for follow-up unaccompanied.  In November, alkaline phosphatase was 140, ALT 35 (around that time taking Tylenol, BC powder for headache)  Update August 02, 2020 SS: Doing well on Gilenya, denies any new worsening symptoms. Remains on gabapentin for numbness/spasms in hands. Continues to work full-time, balance is not great, but no falls. Saw eye doctor in June, everything looked good. No more headaches. MRI of the brain in October 2020, overall stable from prior in October 2018. MS stable.   Update Feb 20, 2021 SS: Found out pregnant last week, stopped Gilenya at that time. Last period was in Feb, but are irregular. This is first baby. Did blood work today to determine gestation, is pending. Taking prenatal vitamin. MS remains overall stable.  Here today unaccompanied.  REVIEW OF SYSTEMS: Out of a complete 14 system review of symptoms, the patient complains only of the following symptoms, and all other reviewed systems are negative.  N/A  ALLERGIES: No Known Allergies  HOME MEDICATIONS: Outpatient Medications  Prior to Visit  Medication Sig Dispense Refill  . Prenatal Vit-Fe Fumarate-FA (PRENATAL VITAMIN) 27-0.8 MG TABS Take 1 tablet by mouth daily. 30 tablet 12  . Fingolimod HCl (GILENYA) 0.5 MG CAPS TAKE 1 CAPSULE (0.5MG  TOTAL) BY MOUTH DAILY 30 capsule 11  . gabapentin (NEURONTIN) 400 MG capsule Take 1 capsule (400 mg total) by mouth 3 (three) times daily. 270 capsule 4   Facility-Administered Medications Prior to Visit  Medication Dose Route Frequency Provider Last Rate Last Admin  . gadopentetate dimeglumine (MAGNEVIST) injection 20 mL  20 mL Intravenous Once PRN Butch Penny, NP        PAST MEDICAL HISTORY: Past Medical History:  Diagnosis Date  . Anemia   . Low back pain 01/20/2013  . MS (multiple sclerosis) (HCC)   . Multiple sclerosis (HCC) 01/12/2013  . Trichomoniasis     PAST SURGICAL HISTORY: Past Surgical History:  Procedure Laterality Date  . NONE      FAMILY HISTORY: Family History  Problem Relation Age of Onset  . Healthy Mother   . Hypertension Maternal Grandmother   . Diabetes Maternal Grandmother   . Hypertension Paternal Grandmother     SOCIAL HISTORY: Social History   Socioeconomic History  . Marital status: Married    Spouse name: Not on file  . Number of children: 0  . Years of education: 37  . Highest education level: Not on file  Occupational  History    Comment: [employer]  Tobacco Use  . Smoking status: Never Smoker  . Smokeless tobacco: Never Used  Vaping Use  . Vaping Use: Never used  Substance and Sexual Activity  . Alcohol use: No  . Drug use: Never  . Sexual activity: Yes    Partners: Male    Birth control/protection: None  Other Topics Concern  . Not on file  Social History Narrative   Patient is working at [employer] time., lives with her husband, no children.   Patient is right handed.   Patient has a high school education and some college.   Patient does not drink caffeine.   Social Determinants of  Health   Financial Resource Strain: Not on file  Food Insecurity: Not on file  Transportation Needs: Not on file  Physical Activity: Not on file  Stress: Not on file  Social Connections: Not on file  Intimate Partner Violence: Not on file   PHYSICAL EXAM  Vitals:   02/20/21 1439  BP: 129/80  Pulse: 80  Weight: 237 lb (107.5 kg)  Height: 5\' 11"  (1.803 m)   Body mass index is 33.05 kg/m.  Generalized: Well developed, in no acute distress   Neurological examination  Mentation: Alert oriented to time, place, history taking. Follows all commands speech and language fluent Cranial nerve II-XII: Pupils were equal round reactive to light. Extraocular movements were full, visual field were full on confrontational test. Facial sensation and strength were normal. Head turning and shoulder shrug  were normal and symmetric. Motor: The motor testing reveals 5 over 5 strength of all 4 extremities. Good symmetric motor tone is noted throughout.  Sensory: Sensory testing is intact to soft touch on all 4 extremities. No evidence of extinction is noted.  Coordination: Cerebellar testing reveals good finger-nose-finger and heel-to-shin bilaterally.  Gait and station: Gait is normal. Tandem gait is slightly unsteady.  Romberg is negative. Reflexes: Deep tendon reflexes are symmetric and normal bilaterally.   DIAGNOSTIC DATA (LABS, IMAGING, TESTING) - I reviewed patient records, labs, notes, testing and imaging myself where available.  Lab Results  Component Value Date   WBC 8.3 08/02/2020   HGB 12.2 08/02/2020   HCT 36.7 08/02/2020   MCV 74 (L) 08/02/2020   PLT 209 08/02/2020      Component Value Date/Time   NA 143 08/02/2020 1417   K 3.7 08/02/2020 1417   K 3.7 04/02/2017 1256   CL 103 08/02/2020 1417   CL 103 04/02/2017 1256   CO2 27 08/02/2020 1417   CO2 29 04/02/2017 1256   GLUCOSE 110 (H) 08/02/2020 1417   GLUCOSE 115 (H) 01/07/2019 0644   BUN 7 08/02/2020 1417   CREATININE  0.72 08/02/2020 1417   CREATININE 0.71 04/02/2017 1256   CALCIUM 9.5 08/02/2020 1417   CALCIUM 9.6 04/02/2017 1256   PROT 6.3 08/02/2020 1417   ALBUMIN 4.1 08/02/2020 1417   ALBUMIN 4.2 04/02/2017 1256   AST 20 08/02/2020 1417   AST 18 04/02/2017 1256   ALT 19 08/02/2020 1417   ALT 20 04/02/2017 1256   ALKPHOS 126 (H) 08/02/2020 1417   ALKPHOS 92 04/02/2017 1256   BILITOT 0.3 08/02/2020 1417   GFRNONAA 110 08/02/2020 1417   GFRAA 126 08/02/2020 1417   Lab Results  Component Value Date   CHOL 136 08/02/2020   HDL 47 08/02/2020   LDLCALC 77 08/02/2020   TRIG 58 08/02/2020   CHOLHDL 3.2 03/03/2017   Lab  Results  Component Value Date   HGBA1C 5.2 03/03/2017   Lab Results  Component Value Date   VITAMINB12 749 04/02/2017   Lab Results  Component Value Date   TSH 1.160 08/02/2020   ASSESSMENT AND PLAN 35 y.o. year old female  has a past medical history of Anemia, Low back pain (01/20/2013), MS (multiple sclerosis) (HCC), Multiple sclerosis (HCC) (01/12/2013), and Trichomoniasis. here with:  1.  Multiple sclerosis, relapsing remitting 2.  Pregnancy, to be determined gestation   -Remain off Gilenya, gabapentin -For MS exacerbations, will treat with prednisone or IV steroids, will hold off on disease modifying agents -MRI of the brain October 2020 stable from prior in October 2018 -Continue prenatal vitamin, close contact with OB -Will follow closely during pregnancy, follow-up in 3 months with Dr. Terrace Arabia, call for any worsening symptoms of MS flare  Margie Ege, AGNP-C, DNP 02/20/2021, 3:14 PM [facility] [address], [address] [place], [place] [address] [phone]

## 2021-02-20 NOTE — Patient Instructions (Addendum)
Stay off Gilenya due to recent pregnancy, gabapentin Continue prenatal vitamin  Will keep close eye, come back in 3 months

## 2021-02-22 ENCOUNTER — Other Ambulatory Visit: Payer: Self-pay | Admitting: Obstetrics and Gynecology

## 2021-02-22 DIAGNOSIS — O3680X0 Pregnancy with inconclusive fetal viability, not applicable or unspecified: Secondary | ICD-10-CM

## 2021-02-28 ENCOUNTER — Inpatient Hospital Stay (HOSPITAL_COMMUNITY): Payer: No Typology Code available for payment source | Admitting: Anesthesiology

## 2021-02-28 ENCOUNTER — Other Ambulatory Visit: Payer: Self-pay

## 2021-02-28 ENCOUNTER — Inpatient Hospital Stay (HOSPITAL_COMMUNITY): Payer: No Typology Code available for payment source

## 2021-02-28 ENCOUNTER — Inpatient Hospital Stay (HOSPITAL_COMMUNITY)
Admission: AD | Admit: 2021-02-28 | Discharge: 2021-02-28 | Disposition: A | Discharge status: Home / Self Care | Payer: No Typology Code available for payment source | Attending: Obstetrics and Gynecology | Admitting: Obstetrics and Gynecology

## 2021-02-28 ENCOUNTER — Encounter (HOSPITAL_COMMUNITY): Payer: Self-pay | Admitting: Obstetrics and Gynecology

## 2021-02-28 ENCOUNTER — Encounter (HOSPITAL_COMMUNITY)
Admission: AD | Disposition: A | Discharge status: Home / Self Care | Payer: Self-pay | Source: Home / Self Care | Attending: Obstetrics and Gynecology

## 2021-02-28 DIAGNOSIS — O26899 Other specified pregnancy related conditions, unspecified trimester: Secondary | ICD-10-CM

## 2021-02-28 DIAGNOSIS — Z3A00 Weeks of gestation of pregnancy not specified: Secondary | ICD-10-CM | POA: Diagnosis not present

## 2021-02-28 DIAGNOSIS — Z79899 Other long term (current) drug therapy: Secondary | ICD-10-CM | POA: Diagnosis not present

## 2021-02-28 DIAGNOSIS — O99351 Diseases of the nervous system complicating pregnancy, first trimester: Secondary | ICD-10-CM | POA: Diagnosis not present

## 2021-02-28 DIAGNOSIS — G35 Multiple sclerosis: Secondary | ICD-10-CM | POA: Insufficient documentation

## 2021-02-28 DIAGNOSIS — Z20822 Contact with and (suspected) exposure to covid-19: Secondary | ICD-10-CM | POA: Diagnosis not present

## 2021-02-28 DIAGNOSIS — Z3A12 12 weeks gestation of pregnancy: Secondary | ICD-10-CM | POA: Insufficient documentation

## 2021-02-28 DIAGNOSIS — O3680X0 Pregnancy with inconclusive fetal viability, not applicable or unspecified: Secondary | ICD-10-CM | POA: Insufficient documentation

## 2021-02-28 DIAGNOSIS — R109 Unspecified abdominal pain: Secondary | ICD-10-CM

## 2021-02-28 DIAGNOSIS — O26891 Other specified pregnancy related conditions, first trimester: Secondary | ICD-10-CM

## 2021-02-28 DIAGNOSIS — N8312 Corpus luteum cyst of left ovary: Secondary | ICD-10-CM | POA: Diagnosis not present

## 2021-02-28 DIAGNOSIS — K668 Other specified disorders of peritoneum: Secondary | ICD-10-CM | POA: Insufficient documentation

## 2021-02-28 DIAGNOSIS — O99891 Other specified diseases and conditions complicating pregnancy: Secondary | ICD-10-CM | POA: Diagnosis not present

## 2021-02-28 HISTORY — PX: LAPAROSCOPY: SHX197

## 2021-02-28 LAB — CBC
HCT: 34.5 % — ABNORMAL LOW (ref 36.0–46.0)
Hemoglobin: 11.7 g/dL — ABNORMAL LOW (ref 12.0–15.0)
MCH: 25.5 pg — ABNORMAL LOW (ref 26.0–34.0)
MCHC: 33.9 g/dL (ref 30.0–36.0)
MCV: 75.3 fL — ABNORMAL LOW (ref 80.0–100.0)
Platelets: 183 10*3/uL (ref 150–400)
RBC: 4.58 MIL/uL (ref 3.87–5.11)
RDW: 14 % (ref 11.5–15.5)
WBC: 8.2 10*3/uL (ref 4.0–10.5)
nRBC: 0 % (ref 0.0–0.2)

## 2021-02-28 LAB — URINALYSIS, ROUTINE W REFLEX MICROSCOPIC
Bilirubin Urine: NEGATIVE
Glucose, UA: NEGATIVE mg/dL
Hgb urine dipstick: NEGATIVE
Ketones, ur: 5 mg/dL — AB
Nitrite: POSITIVE — AB
Protein, ur: NEGATIVE mg/dL
Specific Gravity, Urine: 1.018 (ref 1.005–1.030)
pH: 6 (ref 5.0–8.0)

## 2021-02-28 LAB — RESP PANEL BY RT-PCR (FLU A&B, COVID) ARPGX2
Influenza A by PCR: NEGATIVE
Influenza B by PCR: NEGATIVE
SARS Coronavirus 2 by RT PCR: NEGATIVE

## 2021-02-28 LAB — HCG, QUANTITATIVE, PREGNANCY: hCG, Beta Chain, Quant, S: 22573 m[IU]/mL — ABNORMAL HIGH (ref <5)

## 2021-02-28 LAB — TYPE AND SCREEN
ABO/RH(D): O POS
Antibody Screen: NEGATIVE

## 2021-02-28 LAB — ABO/RH: ABO/RH(D): O POS

## 2021-02-28 SURGERY — LAPAROSCOPY, DIAGNOSTIC
Anesthesia: General | Site: Abdomen | Laterality: Right

## 2021-02-28 MED ORDER — ACETAMINOPHEN 500 MG PO TABS
1000.0000 mg | ORAL_TABLET | ORAL | Status: AC
  Administered 2021-02-28: 1000 mg via ORAL
  Filled 2021-02-28: qty 2

## 2021-02-28 MED ORDER — DEXAMETHASONE SODIUM PHOSPHATE 10 MG/ML IJ SOLN
INTRAMUSCULAR | Status: DC | PRN
  Administered 2021-02-28: 10 mg via INTRAVENOUS

## 2021-02-28 MED ORDER — ROCURONIUM BROMIDE 10 MG/ML (PF) SYRINGE
PREFILLED_SYRINGE | INTRAVENOUS | Status: DC | PRN
  Administered 2021-02-28: 40 mg via INTRAVENOUS
  Administered 2021-02-28: 60 mg via INTRAVENOUS

## 2021-02-28 MED ORDER — CHLORHEXIDINE GLUCONATE 0.12 % MT SOLN
15.0000 mL | OROMUCOSAL | Status: AC
  Administered 2021-02-28: 15 mL via OROMUCOSAL
  Filled 2021-02-28 (×2): qty 15

## 2021-02-28 MED ORDER — LIDOCAINE 2% (20 MG/ML) 5 ML SYRINGE
INTRAMUSCULAR | Status: AC
  Filled 2021-02-28: qty 10

## 2021-02-28 MED ORDER — ACETAMINOPHEN 160 MG/5ML PO SOLN: 325.0000 mg | ORAL | Status: DC | PRN

## 2021-02-28 MED ORDER — FENTANYL CITRATE (PF) 100 MCG/2ML IJ SOLN
25.0000 ug | INTRAMUSCULAR | Status: DC | PRN
  Administered 2021-02-28 (×2): 50 ug via INTRAVENOUS

## 2021-02-28 MED ORDER — MIDAZOLAM HCL 2 MG/2ML IJ SOLN
INTRAMUSCULAR | Status: AC
  Filled 2021-02-28: qty 2

## 2021-02-28 MED ORDER — PROPOFOL 10 MG/ML IV BOLUS
INTRAVENOUS | Status: DC | PRN
  Administered 2021-02-28: 50 mg via INTRAVENOUS
  Administered 2021-02-28: 150 mg via INTRAVENOUS

## 2021-02-28 MED ORDER — BUPIVACAINE HCL (PF) 0.25 % IJ SOLN
INTRAMUSCULAR | Status: AC
  Filled 2021-02-28: qty 30

## 2021-02-28 MED ORDER — BUPIVACAINE HCL (PF) 0.25 % IJ SOLN
INTRAMUSCULAR | Status: DC | PRN
  Administered 2021-02-28: 30 mL

## 2021-02-28 MED ORDER — DEXAMETHASONE SODIUM PHOSPHATE 10 MG/ML IJ SOLN
INTRAMUSCULAR | Status: AC
  Filled 2021-02-28: qty 1

## 2021-02-28 MED ORDER — ROCURONIUM BROMIDE 10 MG/ML (PF) SYRINGE
PREFILLED_SYRINGE | INTRAVENOUS | Status: AC
  Filled 2021-02-28: qty 10

## 2021-02-28 MED ORDER — ACETAMINOPHEN 325 MG PO TABS: 325.0000 mg | ORAL_TABLET | ORAL | Status: DC | PRN

## 2021-02-28 MED ORDER — SUCCINYLCHOLINE CHLORIDE 200 MG/10ML IV SOSY
PREFILLED_SYRINGE | INTRAVENOUS | Status: AC
  Filled 2021-02-28: qty 10

## 2021-02-28 MED ORDER — ONDANSETRON HCL 4 MG/2ML IJ SOLN
INTRAMUSCULAR | Status: DC | PRN
  Administered 2021-02-28: 4 mg via INTRAVENOUS

## 2021-02-28 MED ORDER — ONDANSETRON HCL 4 MG/2ML IJ SOLN
INTRAMUSCULAR | Status: AC
  Filled 2021-02-28: qty 2

## 2021-02-28 MED ORDER — AZITHROMYCIN 500 MG PO TABS
1000.0000 mg | ORAL_TABLET | Freq: Once | ORAL | Status: DC
  Filled 2021-02-28: qty 2

## 2021-02-28 MED ORDER — OXYCODONE HCL 5 MG PO TABS: 5.0000 mg | ORAL_TABLET | Freq: Once | ORAL | Status: DC | PRN

## 2021-02-28 MED ORDER — ACETAMINOPHEN 325 MG PO TABS
650.0000 mg | ORAL_TABLET | Freq: Four times a day (QID) | ORAL | Status: DC | PRN
Start: 2021-02-28 — End: 2021-06-04

## 2021-02-28 MED ORDER — FENTANYL CITRATE (PF) 250 MCG/5ML IJ SOLN
INTRAMUSCULAR | Status: AC
  Filled 2021-02-28: qty 5

## 2021-02-28 MED ORDER — MEPERIDINE HCL 25 MG/ML IJ SOLN: 6.2500 mg | INTRAMUSCULAR | Status: DC | PRN

## 2021-02-28 MED ORDER — FENTANYL CITRATE (PF) 100 MCG/2ML IJ SOLN
INTRAMUSCULAR | Status: AC
  Filled 2021-02-28: qty 2

## 2021-02-28 MED ORDER — CEFTRIAXONE SODIUM 500 MG IJ SOLR
500.0000 mg | Freq: Once | INTRAMUSCULAR | Status: DC
  Filled 2021-02-28: qty 500

## 2021-02-28 MED ORDER — NEOSTIGMINE METHYLSULFATE 3 MG/3ML IV SOSY
PREFILLED_SYRINGE | INTRAVENOUS | Status: AC
  Filled 2021-02-28: qty 3

## 2021-02-28 MED ORDER — PHENYLEPHRINE 40 MCG/ML (10ML) SYRINGE FOR IV PUSH (FOR BLOOD PRESSURE SUPPORT)
PREFILLED_SYRINGE | INTRAVENOUS | Status: DC | PRN
  Administered 2021-02-28: 80 ug via INTRAVENOUS

## 2021-02-28 MED ORDER — 0.9 % SODIUM CHLORIDE (POUR BTL) OPTIME
TOPICAL | Status: DC | PRN
  Administered 2021-02-28: 1000 mL

## 2021-02-28 MED ORDER — PROPOFOL 10 MG/ML IV BOLUS
INTRAVENOUS | Status: AC
  Filled 2021-02-28: qty 20

## 2021-02-28 MED ORDER — LIDOCAINE 2% (20 MG/ML) 5 ML SYRINGE
INTRAMUSCULAR | Status: AC
  Filled 2021-02-28: qty 5

## 2021-02-28 MED ORDER — LACTATED RINGERS IV SOLN: INTRAVENOUS | Status: DC

## 2021-02-28 MED ORDER — PHENYLEPHRINE 40 MCG/ML (10ML) SYRINGE FOR IV PUSH (FOR BLOOD PRESSURE SUPPORT)
PREFILLED_SYRINGE | INTRAVENOUS | Status: AC
  Filled 2021-02-28: qty 10

## 2021-02-28 MED ORDER — MIDAZOLAM HCL 5 MG/5ML IJ SOLN
INTRAMUSCULAR | Status: DC | PRN
  Administered 2021-02-28: 2 mg via INTRAVENOUS

## 2021-02-28 MED ORDER — OXYCODONE HCL 5 MG/5ML PO SOLN
5.0000 mg | Freq: Once | ORAL | Status: DC | PRN
Start: 2021-02-28 — End: 2021-02-28

## 2021-02-28 MED ORDER — SODIUM CHLORIDE 0.9 % IR SOLN
Status: DC | PRN
  Administered 2021-02-28: 1

## 2021-02-28 MED ORDER — SUCCINYLCHOLINE CHLORIDE 200 MG/10ML IV SOSY
PREFILLED_SYRINGE | INTRAVENOUS | Status: AC
  Filled 2021-02-28: qty 20

## 2021-02-28 MED ORDER — SUGAMMADEX SODIUM 200 MG/2ML IV SOLN
INTRAVENOUS | Status: DC | PRN
  Administered 2021-02-28: 200 mg via INTRAVENOUS

## 2021-02-28 MED ORDER — LIDOCAINE 2% (20 MG/ML) 5 ML SYRINGE
INTRAMUSCULAR | Status: DC | PRN
  Administered 2021-02-28: 100 mg via INTRAVENOUS

## 2021-02-28 MED ORDER — ONDANSETRON HCL 4 MG/2ML IJ SOLN: 4.0000 mg | Freq: Once | INTRAMUSCULAR | Status: DC | PRN

## 2021-02-28 MED ORDER — AZITHROMYCIN 250 MG PO TABS: 250.0000 mg | ORAL_TABLET | Freq: Every day | ORAL | 0 refills | Status: AC

## 2021-02-28 MED ORDER — FENTANYL CITRATE (PF) 100 MCG/2ML IJ SOLN
INTRAMUSCULAR | Status: DC | PRN
  Administered 2021-02-28: 50 ug via INTRAVENOUS
  Administered 2021-02-28: 100 ug via INTRAVENOUS
  Administered 2021-02-28 (×2): 50 ug via INTRAVENOUS

## 2021-02-28 SURGICAL SUPPLY — 29 items
APPLICATOR COTTON TIP 6 STRL (MISCELLANEOUS) ×2 IMPLANT
APPLICATOR COTTON TIP 6IN STRL (MISCELLANEOUS) ×4
COVER WAND RF STERILE (DRAPES) ×2 IMPLANT
DERMABOND ADVANCED (GAUZE/BANDAGES/DRESSINGS) ×1
DERMABOND ADVANCED .7 DNX12 (GAUZE/BANDAGES/DRESSINGS) ×1 IMPLANT
GLOVE BIO SURGEON STRL SZ 6 (GLOVE) ×2 IMPLANT
GLOVE SURG UNDER POLY LF SZ6.5 (GLOVE) ×4 IMPLANT
GLOVE SURG UNDER POLY LF SZ7 (GLOVE) ×4 IMPLANT
GOWN STRL REUS W/ TWL LRG LVL3 (GOWN DISPOSABLE) ×2 IMPLANT
GOWN STRL REUS W/TWL LRG LVL3 (GOWN DISPOSABLE) ×2
KIT TURNOVER KIT B (KITS) ×2 IMPLANT
LIGASURE VESSEL 5MM BLUNT TIP (ELECTROSURGICAL) IMPLANT
NEEDLE INSUFFLATION 14GA 120MM (NEEDLE) ×2 IMPLANT
NS IRRIG 1000ML POUR BTL (IV SOLUTION) ×2 IMPLANT
PACK LAPAROSCOPY BASIN (CUSTOM PROCEDURE TRAY) ×2 IMPLANT
PACK TRENDGUARD 450 HYBRID PRO (MISCELLANEOUS) IMPLANT
PAD ARMBOARD 7.5X6 YLW CONV (MISCELLANEOUS) ×4 IMPLANT
POUCH SPECIMEN RETRIEVAL 10MM (ENDOMECHANICALS) IMPLANT
SET IRRIG TUBING LAPAROSCOPIC (IRRIGATION / IRRIGATOR) ×2 IMPLANT
SET TUBE SMOKE EVAC HIGH FLOW (TUBING) ×2 IMPLANT
SLEEVE ENDOPATH XCEL 5M (ENDOMECHANICALS) ×2 IMPLANT
SUT MNCRL AB 3-0 PS2 27 (SUTURE) ×2 IMPLANT
SUT VICRYL 0 UR6 27IN ABS (SUTURE) IMPLANT
TOWEL GREEN STERILE FF (TOWEL DISPOSABLE) ×4 IMPLANT
TRAY FOLEY W/BAG SLVR 14FR LF (SET/KITS/TRAYS/PACK) ×2 IMPLANT
TRENDGUARD 450 HYBRID PRO PACK (MISCELLANEOUS)
TROCAR XCEL NON-BLD 11X100MML (ENDOMECHANICALS) ×2 IMPLANT
TROCAR XCEL NON-BLD 5MMX100MML (ENDOMECHANICALS) ×2 IMPLANT
WARMER LAPAROSCOPE (MISCELLANEOUS) ×2 IMPLANT

## 2021-02-28 NOTE — H&P (Signed)
Carol [name] is an 35 y.o. female. G1P0, LMP 12/04/20, would be [redacted]w[redacted]d by LMP sent from office for suspected ectopic pregnancy. She has no abdominal pain or vaginal bleeding.   Course is as follows:  UPT positive 2 weeks ago 02/16/21 saw [facility] 02/20/2021 had blood work done at Dr. Cherly Hensen office. Quant ~11,000  Today, she was seen in the office for viability scan, which showed gestational sac without fetal pole or yolk sacand a moderate amount of complex free fluid in the pelvis. She was sent to [facility] for further evaluation.   In [facility], HCG 22,573. Hgb 11.7.  Pelvic US shows:  1. Single intrauterine gestational sac but no yolk sac or embryo seen, which is abnormal given the size of the gestational sac. 2. Moderate amount of free fluid in the pelvis, mostly in the regions of the bilateral adnexa. 3. Hyperechoic irregular structure in the right adnexa measuring 2.1 x 0.4 cm. This may represent debris, organizing hematoma, or potentially products of conception. 4. Ectopic pregnancy cannot be excluded with this appearance.    She last ate yesterday.   Her medical history is significant for multiple sclerosis, for which she sees Clinical biochemist.     Past Medical History:  Diagnosis Date  . Anemia   . Low back pain 01/20/2013  . Multiple sclerosis (HCC) 01/12/2013  . Trichomoniasis     Past Surgical History:  Procedure Laterality Date  . NONE      Family History  Problem Relation Age of Onset  . Healthy Mother   . Hypertension Maternal Grandmother   . Diabetes Maternal Grandmother   . Hypertension Paternal Grandmother     Social History:  reports that she has never smoked. She has never used smokeless tobacco. She reports that she does not drink alcohol and does not use drugs.  Allergies: No Known Allergies  Medications Prior to Admission  Medication Sig Dispense Refill Last Dose  . Prenatal Vit-Fe Fumarate-FA (PRENATAL VITAMIN) 27-0.8 MG TABS Take 1 tablet by  mouth daily. 30 tablet 12 02/28/2021 at Unknown time    Review of Systems  Denies abdominal pain, vaginal bleeding, fevers/chills  Blood pressure 115/72, pulse 72, temperature 97.9 F (36.6 C), resp. rate 18, height 5\' 11"  (1.803 m), weight 106.9 kg, last menstrual period 12/16/2020, SpO2 100 %. Physical Exam   Gen: tearful, no acute distress Respiratory: no distress Abd: soft, nontender, no rebound or guarding Ext: no calf edema or tenderness   Results for orders placed or performed during the hospital encounter of 02/28/21 (from the past 24 hour(s))  CBC     Status: Abnormal   Collection Time: 02/28/21 10:45 AM  Result Value Ref Range   WBC 8.2 4.0 - 10.5 K/uL   RBC 4.58 3.87 - 5.11 MIL/uL   Hemoglobin 11.7 (L) 12.0 - 15.0 g/dL   HCT 04/30/21 (L) 16.1 - 09.6 %   MCV 75.3 (L) 80.0 - 100.0 fL   MCH 25.5 (L) 26.0 - 34.0 pg   MCHC 33.9 30.0 - 36.0 g/dL   RDW 04.5 40.9 - 81.1 %   Platelets 183 150 - 400 K/uL   nRBC 0.0 0.0 - 0.2 %  ABO/Rh     Status: None   Collection Time: 02/28/21 10:45 AM  Result Value Ref Range   ABO/RH(D) O POS    No rh immune globuloin      NOT A RH IMMUNE GLOBULIN CANDIDATE, PT RH POSITIVE Performed at [facility], [address] [place], [place] [address]   hCG, quantitative, pregnancy     Status: Abnormal   Collection Time: 02/28/21 10:45 AM  Result Value Ref Range   hCG, Beta Chain, Quant, S 22,573 (H) <5 mIU/mL    US OB LESS THAN 14 WEEKS WITH OB TRANSVAGINAL  Addendum Date: 02/28/2021   ADDENDUM REPORT: 02/28/2021 12:58 ADDENDUM: These results were called by telephone at the time of interpretation on 02/28/2021 at 12:57 pm to provider Judeth Horn , who verbally acknowledged these results. Electronically Signed   By: Ted Mcalpine M.D.   On: 02/28/2021 12:58   Result Date: 02/28/2021 CLINICAL DATA:  Abdominal pain. EXAM: OBSTETRIC <14 WK [place] AND TRANSVAGINAL OB US TECHNIQUE: Both transabdominal and transvaginal ultrasound examinations  were performed for complete evaluation of the gestation as well as the maternal uterus, adnexal regions, and pelvic cul-de-sac. Transvaginal technique was performed to assess early pregnancy. COMPARISON:  None. FINDINGS: Intrauterine gestational sac: Single Yolk sac:  Not Visualized. Embryo:  Not Visualized. MSD: 16.2 mm   6 w   3 d Maternal uterus/adnexae: Normal appearance of the ovaries. Probable corpus luteum on the left ovary. There is however a moderate amount of free fluid in the pelvis, mostly in the regions of the bilateral adnexa. There is a hyperechoic irregular structure in the right adnexa measuring 2.1 x 0.4 cm. IMPRESSION: 1. Single intrauterine gestational sac but no yolk sac or embryo seen, which is abnormal given the size of the gestational sac. 2. Moderate amount of free fluid in the pelvis, mostly in the regions of the bilateral adnexa. 3. Hyperechoic irregular structure in the right adnexa measuring 2.1 x 0.4 cm. This may represent debris, organizing hematoma, or potentially products of conception. 4. Ectopic pregnancy cannot be excluded with this appearance. Please correlate clinically. Electronically Signed: By: Ted Mcalpine M.D. On: 02/28/2021 12:47    Assessment/Plan: 35Y G1P0 with findings suspicious for ruptured right ectopic pregnancy. These findings include a moderate amount of complex appearing fluid in pelvis that is suspicious for hemoperitoneum, an irregular structure in the right adnexa, and an empty intrauterine sac measuring 16.16mm with HCG 22k. She is currently hemodynamically stable.  - I discussed these findings with the patient and her husband. I recommend proceeding with diagnostic laparoscopy for suspected ruptured ectopic pregnancy, likely implanted in right fallopian tube. She signed consent for diagnostic laparoscopy, possible right or left salpingectomy, possible right or left oophorectomy, possible laparotomy. We discussed the risks of the procedure include  infection, bleeding, damage to surrounding organs, blood transfusion, laparotomy. The alternative to the procedure is expectant management, however I discouraged this due to concern for hemoperitoneum.  - T&S and CBC completed, COVID test pending - Main OR aware of urgent add on case - SCDs - No antibiotics indicated  Charlett Nose 02/28/2021, 1:25 PM

## 2021-02-28 NOTE — Brief Op Note (Signed)
02/28/2021  5:23 PM  PATIENT:  Carol [name]  35 y.o. female G1P0 with pregnancy of unknown locatoin  PRE-OPERATIVE DIAGNOSIS:  Complex pelvic fluid on ultrasound with pregnancy of unknown location, concern for ruptured ectopic pregnancy  POST-OPERATIVE DIAGNOSIS:  Pregnancy of unknown location   PROCEDURE:  Procedure(s): LAPAROSCOPY DIAGNOSTIC (Right)  SURGEON:  Surgeon(s) and Role:    * Charlett Nose, MD - Primary    * Waynard Reeds, MD - Assisting  ANESTHESIA:   general  EBL:  Minimal   BLOOD ADMINISTERED:none  DRAINS: none   LOCAL MEDICATIONS USED:  MARCAINE     SPECIMEN:  Source of Specimen:  pelvic washings with posterior cul-de-sac peritoneal nodule  DISPOSITION OF SPECIMEN:  PATHOLOGY  COUNTS:  YES  TOURNIQUET:  * No tourniquets in log *  DICTATION: .Note written in EPIC  PLAN OF CARE: Discharge to home after PACU  PATIENT DISPOSITION:  PACU - hemodynamically stable.   Delay start of Pharmacological VTE agent (>24hrs) due to surgical blood loss or risk of bleeding: not applicable

## 2021-02-28 NOTE — Op Note (Signed)
02/28/2021   5:23 PM   PATIENT:  Carol [name]  35 y.o. female G1P0 with pregnancy of unknown location   PRE-OPERATIVE DIAGNOSIS:  Complex pelvic fluid on ultrasound, pregnancy of unknown location, concern for ruptured ectopic pregnancy   POST-OPERATIVE DIAGNOSIS:  Pregnancy of unknown location   PROCEDURE:   LAPAROSCOPY DIAGNOSTIC   SURGEON:  Surgeon(s) and Role:    * Eurydice Calixto, Terrance Mass, MD - Primary    * Waynard Reeds, MD - Assisting   ANESTHESIA:   general   EBL:  Minimal    BLOOD ADMINISTERED:none   DRAINS: none    LOCAL MEDICATIONS USED:  MARCAINE      SPECIMEN:  Source of Specimen:  pelvic washings with posterior cul-de-sac peritoneal nodule   DISPOSITION OF SPECIMEN:  PATHOLOGY   COUNTS:  YES   PLAN OF CARE: Discharge to home after PACU   PATIENT DISPOSITION:  PACU - hemodynamically stable.  FINDINGS: of yellow fluid in pelvis. A small posterior cul de sac peritoneal nodule was noted. Enlarged retroverted uterus. Normal appearing fallopian tubes bilaterally. Left ovary minimally enlarged with corpus luteum cyst. Right ovary normal appearing. Right infundibulopelvic ligament with congested blood vessels, likely corresponding to adnexal mass finding on ultrasound. Diffuse filmy adhesions of bowel to peritoneum, as well as numerous adhesions around the hepatic space.  PROCEDURE: The patient was appropriately consented and taken to the operating room where general anesthesia was obtained. She was placed in the dorsal lithotomy position with legs in stirrups. Sequential compression devices were applied. She was prepped and draped in normal sterile fashion.   A foley catheter was inserted and draining. A speculum was placed and anterior lip of the cervix was grasped with a tenaculum. An acorn uterine manipulator was inserted.   Attention was turned to the abdominal field. A 1mm incision was made at the base of the umbilicus and a Veress needle was inserted. CO2 gas  was attached and correct location was confirmed by pressure of . Pneumoperitoneum was established to . A 77mm trocar was placed through the incision into the abdominal cavity under direct laparoscopic visualization. Abdominal survey revealed lack of any vascular or visceral injury. The patient was placed into Trendelenburg position. Two additional 69mm ports were placed in the right and left lower quadrants.   A pelvic survey revealed findings as noted above. The yellow pelvic fluid was suctioned and a small peritoneal posterior cul-de-sac nodule was removed - both specimens were sent for pathology. Diffuse filmy adhesions including peri-hepatic adhesions raised concern for history of pelvic inflammatory disease. As noted above, there was no evidence of ectopic pregnancy. The right infundibulopelvic ligament appeared to have congested blood vessels that reduced in size when elevating the right fallopian tube - this likely corresponded to the right adnexal mass seen on ultrasound.   Hemostasis was confirmed. The pneumoperitoneum was released and instruments removed from the abdomen and vagina. A gonorrhea and chamydia swab was collected from the cervix due to concern for pelvic inflammatory disease.  The patient was awakened from anesthesia and taken to the recovery room in stable condition. Rocephin and Azithromycin ordered to be administered prior to discharge. Patient scheduled for follow up in office in 2 days for further evaluation of pregnancy of unknown location.

## 2021-02-28 NOTE — MAU Provider Note (Signed)
History     CSN: [id number]  Arrival date and time: 02/28/21 1000   Event Date/Time   First Provider Initiated Contact with Patient 02/28/21 1046      Chief Complaint  Patient presents with  . ectopic workup   HPI Carol [name] is a 35 y.o. G1P0000 at Unknown who presents from the office for ectopic evaluation. Patient has irregular menstrual cycles & unsure LMP. Has first positive pregnancy test 2 weeks ago. Had hcg of 11,000 on 5/3 at Dr. Purnell Shoemaker office. Went to [facility] ob this morning & had an ultrasound. Per Dr. Tenny Craw, ultrasound showed IUGS with questionable yolk sac but also showed a moderate amount of free fluid. Patient denies abdominal pain, vaginal bleeding, vaginal discharge, fever, or n/v/d.   OB History    Gravida  1   Para  0   Term  0   Preterm  0   AB  0   Living  0     SAB  0   IAB  0   Ectopic  0   Multiple  0   Live Births              Past Medical History:  Diagnosis Date  . Anemia   . Low back pain 01/20/2013  . Multiple sclerosis (HCC) 01/12/2013  . Trichomoniasis     Past Surgical History:  Procedure Laterality Date  . NONE      Family History  Problem Relation Age of Onset  . Healthy Mother   . Hypertension Maternal Grandmother   . Diabetes Maternal Grandmother   . Hypertension Paternal Grandmother     Social History   Tobacco Use  . Smoking status: Never Smoker  . Smokeless tobacco: Never Used  Vaping Use  . Vaping Use: Never used  Substance Use Topics  . Alcohol use: No  . Drug use: Never    Allergies: No Known Allergies  Medications Prior to Admission  Medication Sig Dispense Refill Last Dose  . Prenatal Vit-Fe Fumarate-FA (PRENATAL VITAMIN) 27-0.8 MG TABS Take 1 tablet by mouth daily. 30 tablet 12 02/28/2021 at Unknown time    Review of Systems  Constitutional: Negative.   Gastrointestinal: Negative.   Genitourinary: Negative.    Physical Exam   Blood pressure 115/72, pulse 72, temperature 97.9  F (36.6 C), resp. rate 18, height 5\' 11"  (1.803 m), weight 106.9 kg, last menstrual period 12/16/2020, SpO2 100 %.  Physical Exam Vitals and nursing note reviewed.  Constitutional:      General: She is not in acute distress.    Appearance: Normal appearance.  HENT:     Head: Normocephalic and atraumatic.  Eyes:     General: No scleral icterus. Pulmonary:     Effort: Pulmonary effort is normal. No respiratory distress.  Abdominal:     General: There is no distension.     Palpations: Abdomen is soft.     Tenderness: There is no abdominal tenderness.  Skin:    General: Skin is warm and dry.  Neurological:     Mental Status: She is alert.  Psychiatric:        Mood and Affect: Mood normal.        Behavior: Behavior normal.     MAU Course  Procedures Results for orders placed or performed during the hospital encounter of 02/28/21 (from the past 24 hour(s))  CBC     Status: Abnormal   Collection Time: 02/28/21 10:45 AM  Result Value Ref Range  WBC 8.2 4.0 - 10.5 K/uL   RBC 4.58 3.87 - 5.11 MIL/uL   Hemoglobin 11.7 (L) 12.0 - 15.0 g/dL   HCT 44.3 (L) 15.4 - 00.8 %   MCV 75.3 (L) 80.0 - 100.0 fL   MCH 25.5 (L) 26.0 - 34.0 pg   MCHC 33.9 30.0 - 36.0 g/dL   RDW 67.6 19.5 - 09.3 %   Platelets 183 150 - 400 K/uL   nRBC 0.0 0.0 - 0.2 %  ABO/Rh     Status: None   Collection Time: 02/28/21 10:45 AM  Result Value Ref Range   ABO/RH(D) O POS    No rh immune globuloin      NOT A RH IMMUNE GLOBULIN CANDIDATE, PT RH POSITIVE Performed at [facility], [address], [place], [place] [address]   hCG, quantitative, pregnancy     Status: Abnormal   Collection Time: 02/28/21 10:45 AM  Result Value Ref Range   hCG, Beta Chain, Quant, S 22,573 (H) <5 mIU/mL   US OB LESS THAN 14 WEEKS WITH OB TRANSVAGINAL  Addendum Date: 02/28/2021   ADDENDUM REPORT: 02/28/2021 12:58 ADDENDUM: These results were called by telephone at the time of interpretation on 02/28/2021 at 12:57 pm to  provider Judeth Horn , who verbally acknowledged these results. Electronically Signed   By: Ted Mcalpine M.D.   On: 02/28/2021 12:58   Result Date: 02/28/2021 CLINICAL DATA:  Abdominal pain. EXAM: OBSTETRIC <14 WK [place] AND TRANSVAGINAL OB US TECHNIQUE: Both transabdominal and transvaginal ultrasound examinations were performed for complete evaluation of the gestation as well as the maternal uterus, adnexal regions, and pelvic cul-de-sac. Transvaginal technique was performed to assess early pregnancy. COMPARISON:  None. FINDINGS: Intrauterine gestational sac: Single Yolk sac:  Not Visualized. Embryo:  Not Visualized. MSD: 16.2 mm   6 w   3 d Maternal uterus/adnexae: Normal appearance of the ovaries. Probable corpus luteum on the left ovary. There is however a moderate amount of free fluid in the pelvis, mostly in the regions of the bilateral adnexa. There is a hyperechoic irregular structure in the right adnexa measuring 2.1 x 0.4 cm. IMPRESSION: 1. Single intrauterine gestational sac but no yolk sac or embryo seen, which is abnormal given the size of the gestational sac. 2. Moderate amount of free fluid in the pelvis, mostly in the regions of the bilateral adnexa. 3. Hyperechoic irregular structure in the right adnexa measuring 2.1 x 0.4 cm. This may represent debris, organizing hematoma, or potentially products of conception. 4. Ectopic pregnancy cannot be excluded with this appearance. Please correlate clinically. Electronically Signed: By: Ted Mcalpine M.D. On: 02/28/2021 12:47    MDM Per Dr. Tenny Craw, patient had ultrasound concerning for ectopic. Sent over for labs & ultrasound. Vital signs stable & benign abdominal exam. Will keep patient NPO while in MAU.   Ultrasound shows 16 mm empty IUGS. Adnexal mass measuring 2.1 cm and mdoerate amount of complex fluid per call by radiologist.   Informed Dr. Timothy Lasso of results. Will come speak with patient regarding results & plan of care.  COVID  swab ordered.   Assessment and Plan   1. Abdominal pain during pregnancy in first trimester    Dr. Timothy Lasso to take to OR Covid swab pending  Judeth Horn 02/28/2021, 1:32 PM

## 2021-02-28 NOTE — Anesthesia Procedure Notes (Signed)
Procedure Name: Intubation Date/Time: 02/28/2021 4:00 PM Performed by: Lynnell Chad, CRNA Pre-anesthesia Checklist: Patient identified, Emergency Drugs available, Suction available and Patient being monitored Patient Re-evaluated:Patient Re-evaluated prior to induction Oxygen Delivery Method: Circle System Utilized Preoxygenation: Pre-oxygenation with 100% oxygen Induction Type: IV induction Ventilation: Mask ventilation without difficulty Laryngoscope Size: Miller and 3 Tube type: Oral Tube size: 7.0 mm Number of attempts: 1 Airway Equipment and Method: Stylet and Oral airway Placement Confirmation: ETT inserted through vocal cords under direct vision,  positive ETCO2 and breath sounds checked- equal and bilateral Secured at: 21 cm Tube secured with: Tape Dental Injury: Teeth and Oropharynx as per pre-operative assessment

## 2021-02-28 NOTE — Transfer of Care (Signed)
Immediate Anesthesia Transfer of Care Note  Patient: Carol [name]  Procedure(s) Performed: LAPAROSCOPY DIAGNOSTIC (Right Abdomen)  Patient Location: PACU  Anesthesia Type:General  Level of Consciousness: awake and patient cooperative  Airway & Oxygen Therapy: Patient Spontanous Breathing  Post-op Assessment: Report given to RN and Post -op Vital signs reviewed and stable  Post vital signs: Reviewed and stable  Last Vitals:  Vitals Value Taken Time  BP 146/91 02/28/21 1718  Temp 36.6 C 02/28/21 1718  Pulse 92 02/28/21 1718  Resp 16 02/28/21 1718  SpO2 100 % 02/28/21 1718  Vitals shown include unvalidated device data.  Last Pain:  Vitals:   02/28/21 1434  PainSc: 7          Complications: No complications documented.

## 2021-02-28 NOTE — MAU Note (Signed)
Dr Tenny Craw sent pt for ectopic pregnancy workup. Denies VB or cramping.

## 2021-02-28 NOTE — Anesthesia Preprocedure Evaluation (Addendum)
Anesthesia Evaluation  Patient identified by MRN, date of birth, ID band Patient awake    Reviewed: Allergy & Precautions, H&P , NPO status , Patient's Chart, lab work & pertinent test results, reviewed documented beta blocker date and time   Airway Mallampati: II  TM Distance: >3 FB Neck ROM: full    Dental no notable dental hx. (+) Poor Dentition, Dental Advisory Given, Missing   Pulmonary    Pulmonary exam normal breath sounds clear to auscultation       Cardiovascular Exercise Tolerance: Good  Rhythm:regular Rate:Normal     Neuro/Psych  Headaches, Multiple sclerosis  Diplopia and arm weakness in remission  Neuromuscular disease    GI/Hepatic   Endo/Other    Renal/GU      Musculoskeletal   Abdominal   Peds  Hematology  (+) Blood dyscrasia, anemia ,   Anesthesia Other Findings   Reproductive/Obstetrics (+) Pregnancy                            Anesthesia Physical Anesthesia Plan  ASA: II and emergent  Anesthesia Plan: General   Post-op Pain Management:    Induction: Intravenous and Cricoid pressure planned  PONV Risk Score and Plan: 3 and Ondansetron, Dexamethasone, Treatment may vary due to age or medical condition and Midazolam  Airway Management Planned: Oral ETT  Additional Equipment: None  Intra-op Plan:   Post-operative Plan: Extubation in OR  Informed Consent: I have reviewed the patients History and Physical, chart, labs and discussed the procedure including the risks, benefits and alternatives for the proposed anesthesia with the patient or authorized representative who has indicated his/her understanding and acceptance.     Dental Advisory Given  Plan Discussed with: CRNA and Anesthesiologist  Anesthesia Plan Comments: ( )       Anesthesia Quick Evaluation

## 2021-02-28 NOTE — Discharge Instructions (Signed)
Diagnostic Laparoscopy, Care After The following information offers guidance on how to care for yourself after your procedure. Your health care provider may also give you more specific instructions. If you have problems or questions, contact your health care provider. What can I expect after the procedure? After the procedure, it is common to have:  Mild discomfort in the abdomen.  Sore throat. Women who have laparoscopy with a pelvic examination may have mild cramping and fluid coming from the vagina for a few days after the procedure. Follow these instructions at home: Medicines  Take over-the-counter and prescription medicines only as told by your health care provider.  If you were prescribed an antibiotic medicine, take it as told by your health care provider. Do not stop taking the antibiotic even if you start to feel better.  Ask your health care provider if the medicine prescribed to you: ? Requires you to avoid driving or using machinery. ? Can cause constipation. You may need to take these actions to prevent or treat constipation:  Drink enough fluid to keep your urine pale yellow.  Take over-the-counter or prescription medicines.  Eat foods that are high in fiber, such as beans, whole grains, and fresh fruits and vegetables.  Limit foods that are high in fat and processed sugars, such as fried or sweet foods. Incision care  Follow instructions from your health care provider about how to take care of your incisions. Make sure you: ? Wash your hands with soap and water for at least 20 seconds before and after you change your bandage (dressing). If soap and water are not available, use hand sanitizer. ? Change your dressing as told by your health care provider. ? Leave stitches (sutures), skin glue, or surgical tape in place. These skin closures may need to stay in place for 2 weeks or longer. If surgical tape edges start to loosen and curl up, you may trim the loose edges. Do  not remove the surgical tape completely unless your health care provider tells you to do that.  Check your incision areas every day for signs of infection. Check for: ? Redness, swelling, or pain. ? Fluid or blood. ? Warmth. ? Pus or a bad smell.   Activity  Return to your normal activities as told by your health care provider. Ask your health care provider what activities are safe for you.  Do not lift anything that is heavier than 10 lb (4.5 kg), or the limit that you are told, until your health care provider says that it is safe.  Avoid sitting for a long time without moving. Get up to take short walks every 1-2 hours. This is important to improve blood flow and breathing. Ask for help if you feel weak or unsteady. General instructions  Do not use any products that contain nicotine or tobacco. These products include cigarettes, chewing tobacco, and vaping devices, such as e-cigarettes. If you need help quitting, ask your health care provider.  If you were given a sedative during the procedure, it can affect you for several hours. Do not drive or operate machinery until your health care provider says that it is safe.  Do not take baths, swim, or use a hot tub until your health care provider approves. Ask your health care provider if you may take showers. You may only be allowed to take sponge baths.  Keep all follow-up visits. This is important. Contact a health care provider if:  You develop shoulder pain.  You feel light-headed or   faint.  You are unable to pass gas or have a bowel movement.  You feel nauseous or you vomit.  You develop a rash.  You have any of these signs of infection: ? Redness, swelling, or pain around an incision. ? Fluid or blood coming from an incision. ? Warmth coming from an incision. ? Pus or a bad smell coming from an incision. ? A fever or chills. Get help right away if:  You have severe pain.  You have vomiting that does not go away.  You  have heavy bleeding from the vagina.  Any incision opens up.  You have trouble breathing.  You have chest pain. These symptoms may represent a serious problem that is an emergency. Do not wait to see if the symptoms will go away. Get medical help right away. Call your local emergency services (911 in the [place] Do not drive yourself to the hospital. Summary  After the procedure, it is common to have mild discomfort in the abdomen and a sore throat.  Check your incision areas every day for signs of infection.  Return to your normal activities as told by your health care provider. Ask your health care provider what activities are safe for you. This information is not intended to replace advice given to you by your health care provider. Make sure you discuss any questions you have with your health care provider. Document Revised: 06/02/2020 Document Reviewed: 06/02/2020 Elsevier Patient Education  2021 Elsevier Inc.  

## 2021-03-01 ENCOUNTER — Encounter (HOSPITAL_COMMUNITY): Payer: Self-pay | Admitting: Obstetrics and Gynecology

## 2021-03-01 LAB — GC/CHLAMYDIA PROBE AMP (~~LOC~~) NOT AT ARMC
Chlamydia: NEGATIVE
Comment: NEGATIVE
Comment: NORMAL
Neisseria Gonorrhea: NEGATIVE

## 2021-03-01 NOTE — Anesthesia Postprocedure Evaluation (Signed)
Anesthesia Post Note  Patient: Stefanie Hodgens  Procedure(s) Performed: LAPAROSCOPY DIAGNOSTIC (Right Abdomen)     Patient location during evaluation: PACU Anesthesia Type: General Level of consciousness: awake and alert Pain management: pain level controlled Vital Signs Assessment: post-procedure vital signs reviewed and stable Respiratory status: spontaneous breathing, nonlabored ventilation, respiratory function stable and patient connected to nasal cannula oxygen Cardiovascular status: blood pressure returned to baseline and stable Postop Assessment: no apparent nausea or vomiting Anesthetic complications: no   No complications documented.  Last Vitals:  Vitals:   02/28/21 1732 02/28/21 1747  BP: 136/69 133/75  Pulse: 87 87  Resp: 15 14  Temp:  36.6 C  SpO2: 100% 100%    Last Pain:  Vitals:   02/28/21 1747  PainSc: 4    Pain Goal:                   Raford Brissett

## 2021-03-02 LAB — SURGICAL PATHOLOGY

## 2021-03-08 ENCOUNTER — Encounter (HOSPITAL_COMMUNITY): Payer: Self-pay | Admitting: Obstetrics and Gynecology

## 2021-03-08 ENCOUNTER — Other Ambulatory Visit: Payer: Self-pay

## 2021-03-08 ENCOUNTER — Inpatient Hospital Stay (HOSPITAL_COMMUNITY)
Admission: AD | Admit: 2021-03-08 | Discharge: 2021-03-09 | Disposition: A | Discharge status: Home / Self Care | Payer: No Typology Code available for payment source | Attending: Obstetrics and Gynecology | Admitting: Obstetrics and Gynecology

## 2021-03-08 DIAGNOSIS — Z3A11 11 weeks gestation of pregnancy: Secondary | ICD-10-CM | POA: Insufficient documentation

## 2021-03-08 DIAGNOSIS — R109 Unspecified abdominal pain: Secondary | ICD-10-CM

## 2021-03-08 DIAGNOSIS — O209 Hemorrhage in early pregnancy, unspecified: Secondary | ICD-10-CM

## 2021-03-08 DIAGNOSIS — O26891 Other specified pregnancy related conditions, first trimester: Secondary | ICD-10-CM

## 2021-03-08 DIAGNOSIS — O039 Complete or unspecified spontaneous abortion without complication: Secondary | ICD-10-CM | POA: Insufficient documentation

## 2021-03-08 NOTE — MAU Provider Note (Signed)
History     CSN: [id number]  Arrival date and time: 03/08/21 2248   Event Date/Time   First Provider Initiated Contact with Patient 03/08/21 2352      Chief Complaint  Patient presents with  . Vaginal Bleeding  . Abdominal Pain   HPI Carol [name] is a 35 y.o. G1P0000 at [redacted]w[redacted]d who presents with abdominal cramping & vaginal bleeding. Symptoms started this evening. Reports bleeding was initially like the last few days of a period, bled into her underwear. Now is pink/red spotting on toilet paper. Not saturating pads or passing clots. Has had suprapubic cramping since then that is worse when walking & sitting upright. Rates pain 5/10. Hasn't treated symptoms. Denies fever, n/v/d.  Had surgery last Wednesday for possible ectopic pregnancy but was told it wasn't an ectopic. Had ultrasound & labs last week - was told they still didn't see a baby & her HCG was dropping. Scheduled to have labs & ultrasound repeated tomorrow. Goes to [facility] ob[facility]yn.   OB History    Gravida  1   Para  0   Term  0   Preterm  0   AB  0   Living  0     SAB  0   IAB  0   Ectopic  0   Multiple  0   Live Births              Past Medical History:  Diagnosis Date  . Anemia   . Low back pain 01/20/2013  . Multiple sclerosis (HCC) 01/12/2013  . Trichomoniasis     Past Surgical History:  Procedure Laterality Date  . LAPAROSCOPY Right 02/28/2021   Procedure: LAPAROSCOPY DIAGNOSTIC;  Surgeon: Charlett Nose, MD;  Location: [facility] OR;  Service: Gynecology;  Laterality: Right;  . NONE      Family History  Problem Relation Age of Onset  . Healthy Mother   . Hypertension Maternal Grandmother   . Diabetes Maternal Grandmother   . Hypertension Paternal Grandmother     Social History   Tobacco Use  . Smoking status: Never Smoker  . Smokeless tobacco: Never Used  Vaping Use  . Vaping Use: Never used  Substance Use Topics  . Alcohol use: No  . Drug use: Never    Allergies: No  Known Allergies  Medications Prior to Admission  Medication Sig Dispense Refill Last Dose  . acetaminophen (TYLENOL) 325 MG tablet Take 2 tablets (650 mg total) by mouth every 6 (six) hours as needed.   03/07/2021 at Unknown time  . azithromycin (ZITHROMAX) 250 MG tablet Take 1 tablet (250 mg total) by mouth daily for 14 days. 14 tablet 0 03/08/2021 at Unknown time  . Prenatal Vit-Fe Fumarate-FA (PRENATAL VITAMIN) 27-0.8 MG TABS Take 1 tablet by mouth daily. 30 tablet 12 03/08/2021 at Unknown time    Review of Systems  Constitutional: Negative.   Gastrointestinal: Positive for abdominal pain. Negative for constipation, diarrhea, nausea and vomiting.  Genitourinary: Positive for vaginal bleeding. Negative for dysuria and vaginal discharge.   Physical Exam   Blood pressure 124/84, pulse 85, temperature 98.4 F (36.9 C), resp. rate 18, height 5\' 11"  (1.803 m), weight 105.2 kg, last menstrual period 12/16/2020.  Physical Exam Vitals and nursing note reviewed. Exam conducted with a chaperone present.  Constitutional:      General: She is not in acute distress.    Appearance: She is well-developed. She is not ill-appearing or diaphoretic.  HENT:  Head: Normocephalic and atraumatic.  Pulmonary:     Effort: Pulmonary effort is normal. No respiratory distress.  Abdominal:     General: Abdomen is flat.     Palpations: Abdomen is soft.     Tenderness: There is no abdominal tenderness.  Genitourinary:    General: Normal vulva.     Exam position: Lithotomy position.     Comments: Scant amount of dark red blood in vagina. No active bleeding from os. Cervix visually closed, pink, & smooth.  Skin:    General: Skin is warm and dry.  Neurological:     Mental Status: She is alert.  Psychiatric:        Mood and Affect: Mood normal.        Behavior: Behavior normal.     MAU Course  Procedures Results for orders placed or performed during the hospital encounter of 03/08/21 (from the past 24  hour(s))  CBC     Status: Abnormal   Collection Time: 03/09/21 12:21 AM  Result Value Ref Range   WBC 10.9 (H) 4.0 - 10.5 K/uL   RBC 4.52 3.87 - 5.11 MIL/uL   Hemoglobin 11.8 (L) 12.0 - 15.0 g/dL   HCT 40.0 (L) 86.7 - 61.9 %   MCV 75.2 (L) 80.0 - 100.0 fL   MCH 26.1 26.0 - 34.0 pg   MCHC 34.7 30.0 - 36.0 g/dL   RDW 50.9 32.6 - 71.2 %   Platelets 193 150 - 400 K/uL   nRBC 0.0 0.0 - 0.2 %  Comprehensive metabolic panel     Status: Abnormal   Collection Time: 03/09/21 12:21 AM  Result Value Ref Range   Sodium 135 135 - 145 mmol/L   Potassium 3.6 3.5 - 5.1 mmol/L   Chloride 104 98 - 111 mmol/L   CO2 24 22 - 32 mmol/L   Glucose, Bld 125 (H) 70 - 99 mg/dL   BUN 6 6 - 20 mg/dL   Creatinine, Ser 4.58 0.44 - 1.00 mg/dL   Calcium 9.4 8.9 - 09.9 mg/dL   Total Protein 6.5 6.5 - 8.1 g/dL   Albumin 3.5 3.5 - 5.0 g/dL   AST 38 15 - 41 U/L   ALT 67 (H) 0 - 44 U/L   Alkaline Phosphatase 107 38 - 126 U/L   Total Bilirubin 0.3 0.3 - 1.2 mg/dL   GFR, Estimated >83 >38 mL/min   Anion gap 7 5 - 15  hCG, quantitative, pregnancy     Status: Abnormal   Collection Time: 03/09/21 12:21 AM  Result Value Ref Range   hCG, Beta Chain, Quant, S 17,187 (H) <5 mIU/mL   US OB Transvaginal  Result Date: 03/09/2021 CLINICAL DATA:  Pregnant, increasing abdominal cramping and vaginal bleeding. LMP 09/22/2021 EXAM: TRANSVAGINAL OB ULTRASOUND TECHNIQUE: Transvaginal ultrasound was performed for complete evaluation of the gestation as well as the maternal uterus, adnexal regions, and pelvic cul-de-sac. COMPARISON:  02/28/2021 FINDINGS: Intrauterine gestational sac: Present, single, abnormal containing a small amount of echogenic debris. Yolk sac:  Not visualized Embryo:  Not visualized Cardiac Activity: Not applicable MSD: 15 mm   6 w   2 d Subchorionic hemorrhage: Small subchorionic hemorrhage is identified, best noted on cine images. Maternal uterus/adnexae: The uterus is anteverted. No intrauterine masses are seen.  The cervix is closed and is unremarkable. There is no free fluid within the a pelvis; previously noted free intraperitoneal fluid has resolved. Corpus luteum noted within the left ovary. The maternal ovaries are otherwise unremarkable. IMPRESSION:  Persistent nonvisualization of fetal structures and inappropriate progression since prior examination compatible with an anembryonic pregnancy. Electronically Signed   By: Helyn Numbers MD   On: 03/09/2021 01:15    MDM Patient told by her ob/gyn that she is likely having a miscarriage due to dropping HCGs. Minimal bleeding on exam. Hemoglobin & vitals stable. She is RH positive.   Declines pain medication while in MAU.   Ultrasound shows continued IUGS, measuring 15 mm, now contains some debris. The free fluid & adnexal mass seen on ultrasound last week are no longer present. "Persistent nonvisualization of fetal structures & inappropriate progression since prior exam compatible with anembryonic pregnancy"  Discussed options for management of incomplete AB including expectant management or Cytotec. Prefers cytotec. Verbalizes understanding that intervention may become necessary if SAB in not completed spontaneously or if heavy bleeding or infection occur.    Assessment and Plan   1. Miscarriage   2. Vaginal bleeding in pregnancy, first trimester   3. Abdominal pain during pregnancy in first trimester    -Rx cytotec & oxycodone  -reviewed how to take cytotec at home, side effects, & what to expect - reviewed reasons to return to MAU - instructed to wait until Saturday to take medication since that's when her husband can stay at home with her.  -Call office in the morning to schedule follow up appointment  Judeth Horn 03/08/2021, 11:53 PM

## 2021-03-08 NOTE — MAU Note (Signed)
Pt reports some vaginal bleeding that started a few hours ago. Was heavier but now more like spotting. reports some ab cramping as well.

## 2021-03-09 ENCOUNTER — Inpatient Hospital Stay (HOSPITAL_COMMUNITY): Payer: No Typology Code available for payment source

## 2021-03-09 ENCOUNTER — Encounter (HOSPITAL_COMMUNITY): Payer: Self-pay | Admitting: Obstetrics and Gynecology

## 2021-03-09 DIAGNOSIS — O209 Hemorrhage in early pregnancy, unspecified: Secondary | ICD-10-CM | POA: Diagnosis not present

## 2021-03-09 DIAGNOSIS — Z3A11 11 weeks gestation of pregnancy: Secondary | ICD-10-CM | POA: Diagnosis not present

## 2021-03-09 DIAGNOSIS — O039 Complete or unspecified spontaneous abortion without complication: Secondary | ICD-10-CM

## 2021-03-09 LAB — CBC
HCT: 34 % — ABNORMAL LOW (ref 36.0–46.0)
Hemoglobin: 11.8 g/dL — ABNORMAL LOW (ref 12.0–15.0)
MCH: 26.1 pg (ref 26.0–34.0)
MCHC: 34.7 g/dL (ref 30.0–36.0)
MCV: 75.2 fL — ABNORMAL LOW (ref 80.0–100.0)
Platelets: 193 10*3/uL (ref 150–400)
RBC: 4.52 MIL/uL (ref 3.87–5.11)
RDW: 13.7 % (ref 11.5–15.5)
WBC: 10.9 10*3/uL — ABNORMAL HIGH (ref 4.0–10.5)
nRBC: 0 % (ref 0.0–0.2)

## 2021-03-09 LAB — COMPREHENSIVE METABOLIC PANEL
ALT: 67 U/L — ABNORMAL HIGH (ref 0–44)
AST: 38 U/L (ref 15–41)
Albumin: 3.5 g/dL (ref 3.5–5.0)
Alkaline Phosphatase: 107 U/L (ref 38–126)
Anion gap: 7 (ref 5–15)
BUN: 6 mg/dL (ref 6–20)
CO2: 24 mmol/L (ref 22–32)
Calcium: 9.4 mg/dL (ref 8.9–10.3)
Chloride: 104 mmol/L (ref 98–111)
Creatinine, Ser: 0.71 mg/dL (ref 0.44–1.00)
GFR, Estimated: >60 mL/min (ref >60)
Glucose, Bld: 125 mg/dL — ABNORMAL HIGH (ref 70–99)
Potassium: 3.6 mmol/L (ref 3.5–5.1)
Sodium: 135 mmol/L (ref 135–145)
Total Bilirubin: 0.3 mg/dL (ref 0.3–1.2)
Total Protein: 6.5 g/dL (ref 6.5–8.1)

## 2021-03-09 LAB — HCG, QUANTITATIVE, PREGNANCY: hCG, Beta Chain, Quant, S: 17187 m[IU]/mL — ABNORMAL HIGH (ref <5)

## 2021-03-09 MED ORDER — MISOPROSTOL 200 MCG PO TABS: ORAL_TABLET | ORAL | 1 refills | Status: DC

## 2021-03-09 MED ORDER — OXYCODONE HCL 5 MG PO TABS: 5.0000 mg | ORAL_TABLET | Freq: Four times a day (QID) | ORAL | 0 refills | Status: AC | PRN

## 2021-03-09 NOTE — Discharge Instructions (Signed)
Cytotec for Pregnancy Failure FACTS YOU SHOULD KNOW  WHAT IS AN EARLY PREGNANCY FAILURE? Once the egg is fertilized with the sperm and begins to develop, it attaches to the lining of the uterus. This early pregnancy tissue may not develop into an embryo (the beginning stage of a baby). Sometimes an embryo does develop but does not continue to grow. These problems can be seen on ultrasound.   MANAGEMNT OF EARLY PREGNANCY FAILURE: About 4 out of 100 (0.25%) women will have a pregnancy loss in her lifetime.  One in five pregnancies is found to be an early pregnancy failure.  There are 3 ways to care for an early pregnancy failure:   (1) Surgery, (2) Medicine, (3) Waiting for you to pass the pregnancy on your own. The decision as to how to proceed after being diagnosed with and early pregnancy failure is an individual one.  The decision can be made only after appropriate counseling.  You need to weigh the pros and cons of the 3 choices. Then you can make the choice that works for you. SURGERY (D&E) . Procedure over in 1 day . Requires being put to sleep . Bleeding may be light . Possible problems during surgery, including injury to womb(uterus) . Care provider has more control Medicine (CYTOTEC) . The complete procedure may take days to weeks . No Surgery . Bleeding may be heavy at times . There may be drug side effects . Patient has more control Waiting . You may choose to wait, in which case your own body may complete the passing of the abnormal early pregnancy on its own in about 2-4 weeks . Your bleeding may be heavy at times . There is a small possibility that you may need surgery if the bleeding is too much or not all of the pregnancy has passed. CYTOTEC MANAGEMENT Prostaglandins (cytotec) are the most widely used drug for this purpose. They cause the uterus to cramp and contract. You will place the medicine yourself inside your vagina in the privacy of your home. Empting of the uterus  should occur within 3 days but the process may continue for several weeks. The bleeding may seem heavy at times. POSSIBLE SIDE EFFECTS FROM CYTOTEC . Nausea   Vomiting . Diarrhea Fever . Chills  Hot Flashes Side effects  from the process of the early pregnancy failure include: . Cramping  Bleeding . Headaches  Dizziness RISKS: This is a low risk procedure. Less than 1 in 100 women has a complication. An incomplete passage of the early pregnancy may occur. Also, Hemorrhage (heavy bleeding) could happen.  Rarely the pregnancy will not be passed completely. Excessively heavy bleeding may occur.  Your doctor may need to perform surgery to empty the uterus (D&E). Afterwards: Everybody will feel differently after the early pregnancy completion. You may have soreness or cramps for a day or two. You may have soreness or cramps for day or two.  You may have light bleeding for up to 2 weeks. You may be as active as you feel like being. If you have any of the following problems you may call [facility] at [phone]. . If you have pain that does not get better  with pain medication . Bleeding that soaks through 2 thick full-sized sanitary pads in an hour . Cramps that last longer than 2 days . Foul smelling discharge . Fever above 100.4 degrees F Even if you do not have any of these symptoms, you should have a follow-up exam   to make sure you are healing properly. This appointment will be made for you before you leave the hospital. Your next normal period will start again in 4-6 week after the loss. You can get pregnant soon after the loss, so use birth control right away. Finally: Make sure all your questions are answered before during and after any procedure. Follow up with medical care and family planning methods.       Anembryonic Pregnancy  An anembryonic pregnancy, also known as a blighted ovum, is a common kind of early pregnancy failure and a common cause of early pregnancy  loss. It is often called an early miscarriage. It happens when a fertilized egg attaches to the wall of the uterus but stops growing. Even though the egg never develops, the body acts as if it were pregnant. An early miscarriage can be difficult because you will have the physical symptoms of the loss. You may also have strong emotional symptoms that come with the loss of a pregnancy. What are the causes? This condition is usually caused by a problem with the genes (genetic defect) in the fertilized egg. What are the signs or symptoms? Early symptoms of this condition are the same as those of a normal early pregnancy. They include:  A missed menstrual period.  Tiredness (fatigue).  Nausea.  Sore breasts. Later symptoms are those of pregnancy loss. They include:  Cramps in your abdomen.  Vaginal bleeding or spotting.  A menstrual period that is heavier than usual. How is this diagnosed? This condition is usually diagnosed by a routine ultrasound. It can be confirmed with blood tests. How is this treated? This condition may be treated by:  Waiting until your body naturally gets rid of the empty egg sac and placenta (miscarriage).  Taking medicine to start a miscarriage. This medicine can be taken by mouth or placed into the vagina.  Having a surgical procedure to remove the tissue (dilation and curettage, D&C). The health care provider would open the lowest part of the uterus (cervix) and remove the tissue from the uterus. Follow these instructions at home:  Take over-the-counter and prescription medicines only as told by your health care provider.  Talk with your health care provider about future pregnancies and pregnancy planning. Having this condition does not mean you will lose future pregnancies.  Do not have sex, douche, or put anything, such as tampons, in your vagina until your health care provider says it is okay.  To help you and your partner with the grieving process,  talk with your health care provider or get counseling.  After your miscarriage: ? Rest at home for a few days. ? You may have menstrual-like bleeding for a week or more, and you may have light bleeding for a couple of weeks after that. Wear a pad until vaginal bleeding stops.  Return to your normal activities as soon as you feel well enough. Talk with your health care provider before resuming physical activities that require a lot of effort (are strenuous).  Keep all follow-up visits. This is important. Where to find more information  The [employer] of Obstetricians and Gynecologists: acog.org  U.S. Department of Health and Cytogeneticist of Women's Health: http://hoffman.com/ Contact a health care provider if:  You have a fever or chills.  Your pain medicine is not helping.  You have vaginal bleeding that goes on for longer than expected.  You continue to feel sad after the loss of your pregnancy. Get help right away  if:  You have severe pain in your abdomen.  You feel dizzy or faint.  You faint.  You have very heavy vaginal bleeding. Your vaginal bleeding is very heavy if blood soaks through 2 large sanitary pads an hour for more than 2 hours.  You feel sad, and your sadness takes over your thoughts.  You think about hurting yourself. If you ever feel like you may hurt yourself or others, or have thoughts about taking your own life, get help right away. Go to your nearest emergency department or:  Call your local emergency services (911 in the [place]  Call a suicide crisis helpline, such as the National Suicide Prevention Lifeline at 318 357 0583. This is open 24 hours a day in the [place]  Text the Crisis Text Line at 716-369-7065 (in the [place] Summary  An anembryonic pregnancy is a common kind of early pregnancy loss also known as a blighted ovum or a miscarriage.  Treatment may include waiting until your body naturally gets rid of the empty egg sac  and placenta (miscarriage) or taking medicine to start a miscarriage.  Talk with your health care provider about future pregnancies and pregnancy planning.  Return to your normal activities as soon as you feel well enough. This information is not intended to replace advice given to you by your health care provider. Make sure you discuss any questions you have with your health care provider. Document Revised: 04/07/2020 Document Reviewed: 04/07/2020 Elsevier Patient Education  2021 [employer].

## 2021-03-13 ENCOUNTER — Ambulatory Visit: Admission: RE | Admit: 2021-03-13 | Payer: No Typology Code available for payment source | Source: Ambulatory Visit

## 2021-03-15 ENCOUNTER — Telehealth: Payer: Self-pay | Admitting: Neurology

## 2021-03-15 NOTE — Telephone Encounter (Signed)
[employer]) called, application was sent last October 16, 2020 for Gilenya. Want to know if she needs assistance with Gilenya?

## 2021-03-20 NOTE — Telephone Encounter (Signed)
I called spoke to [facility] with [employer]  fianancial assistance program) for 2022 (needs application financial information for re enrollment). I relayed that pt is pregnant at this time.  Fax infor if appli [phone] (this for pts information (financial).

## 2021-03-27 NOTE — Telephone Encounter (Signed)
I called patient to relay that application for her Gilenya was approved for PPAF until 10/20/2021 she states she had a miscarriage and has talked to them 03-26-21 and they are going to send out her Gilenya.  I relayed usually if they have been off it for more than 10 days require first dose observation.  She had done at an  urgent care initially when she started.  I told her not to start this medication until I talk to Maralyn Sago and get back with her.  She verbalized understanding.

## 2021-03-27 NOTE — Telephone Encounter (Signed)
Received fax from [employer] patient assistance foundation.  She has been approved she is now eligible to receive Gilenya until 10/20/2021 at no out-of-pocket cost as long as program eligibility criteria continue to be that [phone].

## 2021-03-27 NOTE — Telephone Encounter (Signed)
I called patient back.  I relayed that 4 weeks is the max of being off the medication and after that would need a first dose observation prior to starting Gilenya and I mentioned per Dr. Terrace Arabia that we refer out to Dr. Jacinto Halim cardiologist and do the first dose observation after seeing them there.  She then relayed to me that she has had Gilenya capsules and had started taking the medication the next day after her miscarriage.  She did not tell me that when I spoke to her previously.   So that would have started taking 07 Mar 2021.  She states she is doing fine.

## 2021-03-27 NOTE — Telephone Encounter (Signed)
Okay, please let us know if any problems. Has appointment in August with Dr. Terrace Arabia, can keep this, again we do not recommend becoming pregnant while taking Gilenya.

## 2021-03-27 NOTE — Telephone Encounter (Signed)
Unfortunately right on border line greater than 4 weeks, best to do first dose observation. Need to consider if she is planning to become pregnant again, we need to know this for MS medication management.

## 2021-03-28 NOTE — Telephone Encounter (Signed)
Spoke to patient relayed Sarah's message as she had not read her Designer, industrial/product.  Patient verbalized understanding and will keep appointment with Dr. Terrace Arabia in August.

## 2021-04-17 ENCOUNTER — Other Ambulatory Visit: Payer: Self-pay | Admitting: *Deleted

## 2021-04-17 MED ORDER — GILENYA 0.5 MG PO CAPS: 1.0000 | ORAL_CAPSULE | Freq: Every day | ORAL | 3 refills | Status: DC

## 2021-04-24 ENCOUNTER — Other Ambulatory Visit: Payer: Self-pay | Admitting: Neurology

## 2021-04-24 DIAGNOSIS — R202 Paresthesia of skin: Secondary | ICD-10-CM

## 2021-05-24 ENCOUNTER — Ambulatory Visit: Payer: No Typology Code available for payment source | Admitting: Neurology

## 2021-06-04 ENCOUNTER — Ambulatory Visit (INDEPENDENT_AMBULATORY_CARE_PROVIDER_SITE_OTHER): Payer: No Typology Code available for payment source | Admitting: Neurology

## 2021-06-04 ENCOUNTER — Encounter: Payer: Self-pay | Admitting: Neurology

## 2021-06-04 VITALS — BP 121/86 | HR 70 | Ht 71.0 in | Wt 229.0 lb

## 2021-06-04 DIAGNOSIS — G35 Multiple sclerosis: Secondary | ICD-10-CM | POA: Diagnosis not present

## 2021-06-04 NOTE — Progress Notes (Signed)
ASSESSMENT AND PLAN 35 y.o. year old female    Multiple sclerosis, relapsing remitting Pregnancy, to be determined gestation   She denies any recent flareups of MS symptoms,  Personally reviewed most recent MRI of the brain with without contrast in October 2020, multiple round and ovoid periventricular, subcortical, juxtacortical chronic demyelinating plaque, no new lesion, no contrast-enhancement, no change compared to previous MRI in October 2018  She has stopped Gilenya since May 27, 2021 due to recent positive pregnancy test,  Emphasized importance of folic acid supplement, stay off Gilenya, immunomodulation therapy,  Follow-up in 6 months  DIAGNOSTIC DATA (LABS, IMAGING, TESTING) - I reviewed patient records, labs, notes, testing and imaging myself where available.  MRI brain (with and without) on Aug 17 2019: - Multiple round and ovoid periventricular and subcortical and juxtacortical chronic demyelinating plaques. No abnormal lesions are seen on post contrast views. - No acute plaques. No change from MRI 08/13/17.    Laboratory evaluations May 2022, hCG was also positive then, CMP showed normal creatinine, WBC showed mild anemia hemoglobin of 11.8  HISTORY OF PRESENT ILLNESS: Today 06/04/21  HISTORY    Carol [name] is a 35 year old female with a history of relapse remitting multiple sclerosis.    She was enrolled in [place], randomized to Gilenya, since March 5th 2014.    In March 2013, she developed new-onset right eye pain and blurred vision. She was initially diagnosed with corneal abrasion. She went to the emergency room twice for this. She then followed up with ophthalmology, who ordered MRI of the brain and orbits. This showed acute right optic neuritis and 7-8 chronic demyelinating plaques. She was diagnosed with probable multiple sclerosis    Around beginning of December 2013, patient developed new symptoms of numbness in her bilateral fingertips and bilateral toes,  lasted for 2 weeks.   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 Patient also has intermittent muscle spasms and balance difficulty. No family history of MS.   MRI brain showed approximately six lesions in the brain which are consistent with multiple sclerosis. there is abnormality in the right optic nerve consistent with  multiple sclerosis, see below report.  No enhancing lesions are identified.   MRI ORBITS in April 2013: Mild swelling of hyperintensity in the right optic nerve is present.    Showed abnormal  enhancement with slight stranding in the surrounding orbital fat suggesting acute inflammation.  Enhancement is most prominent within the optic canal on the right.  Optic chiasm is normal. The left optic nerve is normal.     She remains on Gilenya and is tolerating it well. She denies any new numbness or weakness. She continues to report numbness in the hands and lower extremity. She states occasionally she'll have burning and tingling pain in the right leg. The symptoms are intermittent and has occurred before. She denies any changes with her gait or balance. Denies any changes with her vision. No change in her mood or behavior. Her last MRI of the brain was in 2017. At that time he did show 2 new lesions however the patient had been off her Gilenya for 5 months. The patient also sees a hematologist for anemia. She denies any new neurological symptoms. She returns today for an evaluation.   Update 03/16/2019 YY: There was no significant worsening of her MS symptoms, she continue have intermittent bilateral upper and lower extremity paresthesia, taking  gabapentin 400 mg 3 times a day,   She denies visual loss, no significant gait abnormality, has been taking Gilenya since 2014, tolerating it well,   I personally reviewed MRI brain in Oct 2018: Multiple round and ovoid periventricular and  subcortical foci of chronic demyelinating plaque, no change compared to scan in November 2019   She presented to emergency room on January 07, 2019 for fever, 104, whole body achy pain, cough, chest x-ray showed low lung volumes with bibasilar atelectasis, infiltrates, patient symptom last for 1 week, now improved, per patient, COVID-19 RNA testing was negative, but I do not find the report   Laboratory evaluations showed hemoglobin of 12.6, MCV of 75.4, platelet was 132, BMP showed potassium of 3.3, glucose was elevated 115,  UPDATE June 04 2021: She stopped Gilenya about a week ago, reported home testing was positive twice for pregnancy, it seems to be the similar story in May 2022, hCG beta chain was strongly positive then, pregnancy was terminated  She denies any new flareups of MS symptoms, overall doing well, working from home  REVIEW OF SYSTEMS: Out of a complete 14 system review of symptoms, the patient complains only of the following symptoms, and all other reviewed systems are negative.  N/A  PHYSICAL EXAM  Vitals:   06/04/21 1314  BP: 121/86  Pulse: 70  Weight: 229 lb (103.9 kg)  Height: 5\' 11"  (1.803 m)   Body mass index is 31.94 kg/m.   PHYSICAL EXAMNIATION:  Gen: NAD, conversant, well nourised, well groomed                 NEUROLOGICAL EXAM:  MENTAL STATUS: Speech/Cognition: Awake, alert, normal speech, oriented to history taking and casual conversation.  CRANIAL NERVES: CN II: Visual fields are full to confrontation.  Pupils are round equal and briskly reactive to light. CN III, IV, VI: extraocular movement are normal. No ptosis. CN V: Facial sensation is intact to light touch. CN VII: Face is symmetric with normal eye closure and smile. CN VIII: Hearing is normal to casual conversation CN IX, X: Palate elevates symmetrically. Phonation is normal. CN XI: Head turning and shoulder shrug are intact CN XII: Tongue is midline with normal movements and no  atrophy.  MOTOR: Muscle bulk and tone are normal. Muscle strength is normal.  REFLEXES: Reflexes are 2  and symmetric at the biceps, triceps, knees and ankles. Plantar responses are flexor.  SENSORY: Intact to light touch, pinprick, positional and vibratory sensation at fingers and toes.  COORDINATION: There is no trunk or limb ataxia.    GAIT/STANCE: Posture is normal. Gait is steady with normal steps, base, arm swing and turning.     ALLERGIES: No Known Allergies  HOME MEDICATIONS: Outpatient Medications Prior to Visit  Medication Sig Dispense Refill   Prenatal Vit-Fe Fumarate-FA (PRENATAL VITAMIN) 27-0.8 MG TABS Take 1 tablet by mouth daily. 30 tablet 12   acetaminophen (TYLENOL) 325 MG tablet Take 2 tablets (650 mg total) by mouth every 6 (six) hours as needed.     Fingolimod HCl (GILENYA) 0.5 MG CAPS Take 1 capsule (0.5 mg total) by mouth daily. 90 capsule 3   misoprostol (CYTOTEC) 200 MCG tablet Take 800 mcg (4 tablets). Repeat dosing if no heavy bleeding within 24 hours. 4 tablet 1   Facility-Administered Medications Prior to Visit  Medication Dose Route Frequency Provider Last Rate Last Admin   gadopentetate dimeglumine (MAGNEVIST) injection 20 mL  20 mL Intravenous Once PRN , NP  PAST MEDICAL HISTORY: Past Medical History:  Diagnosis Date   Anemia    Low back pain 01/20/2013   Multiple sclerosis (HCC) 01/12/2013   Trichomoniasis     PAST SURGICAL HISTORY: Past Surgical History:  Procedure Laterality Date   LAPAROSCOPY Right 02/28/2021   Procedure: LAPAROSCOPY DIAGNOSTIC;  Surgeon: Charlett Nose, MD;  Location: [facility] OR;  Service: Gynecology;  Laterality: Right;    FAMILY HISTORY: Family History  Problem Relation Age of Onset   Healthy Mother    Hypertension Maternal Grandmother    Diabetes Maternal Grandmother    Hypertension Paternal Grandmother     SOCIAL HISTORY: Social History   Socioeconomic History   Marital status:  Married    Spouse name: Not on file   Number of children: 0   Years of education: 12   Highest education level: Not on file  Occupational History    Comment: [employer]  Tobacco Use   Smoking status: Never   Smokeless tobacco: Never  Vaping Use   Vaping Use: Never used  Substance and Sexual Activity   Alcohol use: No   Drug use: Never   Sexual activity: Yes    Partners: Male    Birth control/protection: None  Other Topics Concern   Not on file  Social History Narrative   Patient is working at [employer] care Full time., lives with her husband, no children.   Patient is right handed.   Patient has a high school education and some college.   Patient does not drink caffeine.   Social Determinants of Health   Financial Resource Strain: Not on file  Food Insecurity: Not on file  Transportation Needs: Not on file  Physical Activity: Not on file  Stress: Not on file  Social Connections: Not on file  Intimate Partner Violence: Not on file       Levert Feinstein, M.D. Ph.D.  [facility] [address] [place], [place] [address] Phone: [phone] Fax:      [phone]

## 2021-07-25 LAB — OB RESULTS CONSOLE HIV ANTIBODY (ROUTINE TESTING): HIV: NONREACTIVE

## 2021-07-25 LAB — OB RESULTS CONSOLE RPR: RPR: NONREACTIVE

## 2021-07-25 LAB — OB RESULTS CONSOLE RUBELLA ANTIBODY, IGM: Rubella: IMMUNE

## 2021-07-30 ENCOUNTER — Other Ambulatory Visit: Payer: Self-pay | Admitting: Obstetrics and Gynecology

## 2021-07-30 DIAGNOSIS — Z363 Encounter for antenatal screening for malformations: Secondary | ICD-10-CM

## 2021-09-18 ENCOUNTER — Encounter: Payer: Self-pay | Admitting: *Deleted

## 2021-09-21 ENCOUNTER — Encounter: Payer: Self-pay | Admitting: *Deleted

## 2021-09-21 ENCOUNTER — Other Ambulatory Visit: Payer: Self-pay

## 2021-09-21 ENCOUNTER — Ambulatory Visit: Payer: No Typology Code available for payment source | Attending: Obstetrics and Gynecology

## 2021-09-21 ENCOUNTER — Ambulatory Visit: Payer: No Typology Code available for payment source | Admitting: *Deleted

## 2021-09-21 ENCOUNTER — Other Ambulatory Visit: Payer: Self-pay | Admitting: *Deleted

## 2021-09-21 VITALS — BP 127/75 | HR 99

## 2021-09-21 DIAGNOSIS — O09522 Supervision of elderly multigravida, second trimester: Secondary | ICD-10-CM

## 2021-09-21 DIAGNOSIS — Z363 Encounter for antenatal screening for malformations: Secondary | ICD-10-CM | POA: Diagnosis not present

## 2021-09-21 DIAGNOSIS — Z3492 Encounter for supervision of normal pregnancy, unspecified, second trimester: Secondary | ICD-10-CM

## 2021-10-19 ENCOUNTER — Ambulatory Visit: Payer: No Typology Code available for payment source | Attending: Obstetrics

## 2021-10-19 ENCOUNTER — Ambulatory Visit: Payer: No Typology Code available for payment source | Admitting: *Deleted

## 2021-10-19 ENCOUNTER — Other Ambulatory Visit: Payer: Self-pay

## 2021-10-19 VITALS — BP 112/72 | HR 90

## 2021-10-19 DIAGNOSIS — O09522 Supervision of elderly multigravida, second trimester: Secondary | ICD-10-CM | POA: Insufficient documentation

## 2021-10-19 DIAGNOSIS — O99212 Obesity complicating pregnancy, second trimester: Secondary | ICD-10-CM | POA: Insufficient documentation

## 2021-10-19 DIAGNOSIS — Z3492 Encounter for supervision of normal pregnancy, unspecified, second trimester: Secondary | ICD-10-CM | POA: Diagnosis present

## 2021-10-19 DIAGNOSIS — Z362 Encounter for other antenatal screening follow-up: Secondary | ICD-10-CM | POA: Diagnosis not present

## 2021-10-19 DIAGNOSIS — G35 Multiple sclerosis: Secondary | ICD-10-CM | POA: Diagnosis not present

## 2021-10-19 DIAGNOSIS — Z3A23 23 weeks gestation of pregnancy: Secondary | ICD-10-CM | POA: Insufficient documentation

## 2021-10-19 DIAGNOSIS — O99352 Diseases of the nervous system complicating pregnancy, second trimester: Secondary | ICD-10-CM | POA: Insufficient documentation

## 2021-10-19 DIAGNOSIS — O358XX0 Maternal care for other (suspected) fetal abnormality and damage, not applicable or unspecified: Secondary | ICD-10-CM | POA: Insufficient documentation

## 2021-12-05 ENCOUNTER — Ambulatory Visit (INDEPENDENT_AMBULATORY_CARE_PROVIDER_SITE_OTHER): Payer: No Typology Code available for payment source | Admitting: Neurology

## 2021-12-05 ENCOUNTER — Encounter: Payer: Self-pay | Admitting: Neurology

## 2021-12-05 VITALS — BP 133/80 | HR 90 | Ht 71.0 in | Wt 250.0 lb

## 2021-12-05 DIAGNOSIS — Z3490 Encounter for supervision of normal pregnancy, unspecified, unspecified trimester: Secondary | ICD-10-CM | POA: Insufficient documentation

## 2021-12-05 DIAGNOSIS — G35 Multiple sclerosis: Secondary | ICD-10-CM

## 2021-12-05 DIAGNOSIS — Z3A29 29 weeks gestation of pregnancy: Secondary | ICD-10-CM

## 2021-12-05 NOTE — Progress Notes (Signed)
Carol [name]: Carol Carol [name] DOB: [birth year]  REASON FOR VISIT: Follow up for MS HISTORY FROM: Carol [name] PRIMARY NEUROLOGIST: Dr.Yan   HISTORY Carol Carol [name] is a 36 year old female with a history of relapse remitting multiple sclerosis.    She was enrolled in [facility], randomized to Gilenya, since March 5th 2014.    In March 2013, she developed new-onset right eye pain and blurred vision. She was initially diagnosed with corneal abrasion. She went to [facility] emergency room twice for this. She then followed up with ophthalmology, who ordered MRI of Carol brain and orbits. This showed acute right optic neuritis and 7-8 chronic demyelinating plaques. She was diagnosed with probable multiple sclerosis    Around beginning of December 2013, Carol [name] developed new symptoms of numbness in her bilateral fingertips and bilateral toes, lasted for 2 weeks.   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 Carol [name] also has intermittent muscle spasms and balance difficulty. No family history of MS.   MRI brain showed approximately six lesions in Carol brain which are consistent with multiple sclerosis. there is abnormality in Carol right optic nerve consistent with  multiple sclerosis, see below report.  No enhancing lesions are identified.   MRI ORBITS in April 2013: Mild swelling of hyperintensity in Carol right optic nerve is present.    Showed abnormal  enhancement with slight stranding in Carol surrounding orbital fat suggesting acute inflammation.  Enhancement is most prominent within Carol optic canal on Carol right.  Optic chiasm is normal. Carol left optic nerve is normal.     She remains on Gilenya and is tolerating it well. She denies any new numbness or weakness. She continues to report numbness in Carol hands and lower extremity. She states occasionally she'll have burning and tingling pain in Carol right leg. Carol symptoms are  intermittent and has occurred before. She denies any changes with her gait or balance. Denies any changes with her vision. No change in her mood or behavior. Her last MRI of Carol brain was in 2017. At that time he did show 2 new lesions however Carol Carol [name] had been off her Gilenya for 5 months. Carol Carol [name] also sees a hematologist for anemia. She denies any new neurological symptoms. She returns today for an evaluation.   Update 03/16/2019 YY: There was no significant worsening of her MS symptoms, she continue have intermittent bilateral upper and lower extremity paresthesia, taking gabapentin 400 mg 3 times a day,   She denies visual loss, no significant gait abnormality, has been taking Gilenya since 2014, tolerating it well,   I personally reviewed MRI brain in Oct 2018: Multiple round and ovoid periventricular and subcortical foci of chronic demyelinating plaque, no change compared to scan in November 2019   She presented to emergency room on January 07, 2019 for fever, 104, whole body achy pain, cough, chest x-ray showed low lung volumes with bibasilar atelectasis, infiltrates, Carol [name] symptom last for 1 week, now improved, per Carol [name], COVID-19 RNA testing was negative, but I do not find Carol report   Laboratory evaluations showed hemoglobin of 12.6, MCV of 75.4, platelet was 132, BMP showed potassium of 3.3, glucose was elevated 115,   UPDATE June 04 2021: She stopped Gilenya about a week ago, reported home testing was positive twice for pregnancy, it seems to be Carol similar story in May 2022, hCG beta chain was strongly positive then, pregnancy was terminated   She denies  any new flareups of MS symptoms, overall doing well, working from home  Update December 05, 2021 SS: Is pregnant [redacted] weeks 5 days, due date is February 15, 2022. Having a girl. Pregnancy is going well so far, no issues.  On prenatal vitamin.  Denies any MS symptoms.  At times, may have tingling/numbness to her hands  intermittently.  Does not plan to breast-feed.  Remains off Gilenya.  REVIEW OF SYSTEMS: Out of a complete 14 system review of symptoms, Carol Carol [name] complains only of Carol following symptoms, and all other reviewed systems are negative.  See HPI  ALLERGIES: No Known Allergies  HOME MEDICATIONS: Outpatient Medications Prior to Visit  Medication Sig Dispense Refill   Prenatal Vit-Fe Fumarate-FA (PRENATAL VITAMIN) 27-0.8 MG TABS Take 1 tablet by mouth daily. 30 tablet 12   Facility-Administered Medications Prior to Visit  Medication Dose Route Frequency Provider Last Rate Last Admin   gadopentetate dimeglumine (MAGNEVIST) injection 20 mL  20 mL Intravenous Once PRN Butch Penny, NP        PAST MEDICAL HISTORY: Past Medical History:  Diagnosis Date   Anemia    Low back pain 01/20/2013   Multiple sclerosis (HCC) 01/12/2013   Trichomoniasis     PAST SURGICAL HISTORY: Past Surgical History:  Procedure Laterality Date   LAPAROSCOPY Right 02/28/2021   Procedure: LAPAROSCOPY DIAGNOSTIC;  Surgeon: Charlett Nose, MD;  Location: [facility] OR;  Service: Gynecology;  Laterality: Right;    FAMILY HISTORY: Family History  Problem Relation Age of Onset   Healthy Mother    Hypertension Maternal Grandmother    Diabetes Maternal Grandmother    Hypertension Paternal Grandmother     SOCIAL HISTORY: Social History   Socioeconomic History   Marital status: Married    Spouse name: Not on file   Number of children: 0   Years of education: 12   Highest education level: Not on file  Occupational History    Comment: [employer]  Tobacco Use   Smoking status: Never   Smokeless tobacco: Never  Vaping Use   Vaping Use: Never used  Substance and Sexual Activity   Alcohol use: No   Drug use: Never   Sexual activity: Yes    Partners: Male    Birth control/protection: None  Other Topics Concern   Not on file  Social History Narrative   Carol [name] is working at [employer] care  Full time., lives with her husband, no children.   Carol [name] is right handed.   Carol [name] has a high school education and some college.   Carol [name] does not drink caffeine.   Social Determinants of Health   Financial Resource Strain: Not on file  Food Insecurity: Not on file  Transportation Needs: Not on file  Physical Activity: Not on file  Stress: Not on file  Social Connections: Not on file  Intimate Partner Violence: Not on file   PHYSICAL EXAM  Vitals:   12/05/21 1051  BP: 133/80  Pulse: 90  Weight: 250 lb (113.4 kg)  Height: 5\' 11"  (1.803 m)   Body mass index is 34.87 kg/m.  Generalized: Well developed, in no acute distress   Neurological examination  Mentation: Alert oriented to time, place, history taking. Follows all commands speech and language fluent Cranial nerve II-XII: Pupils were equal round reactive to light. Extraocular movements were full, visual field were full on confrontational test. Facial sensation and strength were normal. . Head turning and shoulder shrug  were normal and symmetric. Motor: Carol  motor testing reveals 5 over 5 strength of all 4 extremities. Good symmetric motor tone is noted throughout.  Sensory: Sensory testing is intact to soft touch on all 4 extremities. No evidence of extinction is noted.  Coordination: Cerebellar testing reveals good finger-nose-finger and heel-to-shin bilaterally.  Gait and station: Gait is normal.   Reflexes: Deep tendon reflexes are symmetric and normal bilaterally.   DIAGNOSTIC DATA (LABS, IMAGING, TESTING) - I reviewed Carol [name] records, labs, notes, testing and imaging myself where available.  Lab Results  Component Value Date   WBC 10.9 (H) 03/09/2021   HGB 11.8 (L) 03/09/2021   HCT 34.0 (L) 03/09/2021   MCV 75.2 (L) 03/09/2021   PLT 193 03/09/2021      Component Value Date/Time   NA 135 03/09/2021 0021   NA 143 08/02/2020 1417   K 3.6 03/09/2021 0021   K 3.7 04/02/2017 1256   CL 104 03/09/2021 0021    CL 103 04/02/2017 1256   CO2 24 03/09/2021 0021   CO2 29 04/02/2017 1256   GLUCOSE 125 (H) 03/09/2021 0021   BUN 6 03/09/2021 0021   BUN 7 08/02/2020 1417   CREATININE 0.71 03/09/2021 0021   CREATININE 0.71 04/02/2017 1256   CALCIUM 9.4 03/09/2021 0021   CALCIUM 9.6 04/02/2017 1256   PROT 6.5 03/09/2021 0021   PROT 6.3 08/02/2020 1417   ALBUMIN 3.5 03/09/2021 0021   ALBUMIN 4.1 08/02/2020 1417   ALBUMIN 4.2 04/02/2017 1256   AST 38 03/09/2021 0021   AST 18 04/02/2017 1256   ALT 67 (H) 03/09/2021 0021   ALT 20 04/02/2017 1256   ALKPHOS 107 03/09/2021 0021   ALKPHOS 92 04/02/2017 1256   BILITOT 0.3 03/09/2021 0021   BILITOT 0.3 08/02/2020 1417   GFRNONAA >60 03/09/2021 0021   GFRAA 126 08/02/2020 1417   Lab Results  Component Value Date   CHOL 136 08/02/2020   HDL 47 08/02/2020   LDLCALC 77 08/02/2020   TRIG 58 08/02/2020   CHOLHDL 3.2 03/03/2017   Lab Results  Component Value Date   HGBA1C 5.2 03/03/2017   Lab Results  Component Value Date   VITAMINB12 749 04/02/2017   Lab Results  Component Value Date   TSH 1.160 08/02/2020    ASSESSMENT AND PLAN 36 y.o. year old female   1.  Relapsing remitting multiple sclerosis 2.  Pregnancy, 29 weeks 5 days, estimated due date is February 15, 2022  -So far MS is doing well, no signs of exacerbation  -Encouraged to continue prenatal vitamin -Will see 4 weeks postpartum with Dr. Terrace Arabia to discuss restarting MS medication, she does not plan to breast-feed, we have discussed that Carol postpartum period is a high risk time for MS activity, appointment 03/07/22 scheduled with Dr. Terrace Arabia, her husband will attend as well -If she delivers sooner or has any MS issues she is to contact our office   Margie Ege, AGNP-C, DNP 12/05/2021, 11:11 AM [facility] [address] [place], [place] [address] [phone]

## 2021-12-05 NOTE — Patient Instructions (Signed)
Need to see you 4 weeks after delivery, call us if you deliver sooner Call for any symptoms of MS exacerbation  Will schedule an appointment for mid May

## 2021-12-12 NOTE — Progress Notes (Signed)
Chart reviewed, agree above plan ?

## 2021-12-19 ENCOUNTER — Other Ambulatory Visit: Payer: Self-pay

## 2021-12-19 ENCOUNTER — Encounter: Payer: No Typology Code available for payment source | Attending: Obstetrics and Gynecology | Admitting: Registered"

## 2021-12-19 DIAGNOSIS — O24419 Gestational diabetes mellitus in pregnancy, unspecified control: Secondary | ICD-10-CM | POA: Insufficient documentation

## 2021-12-20 ENCOUNTER — Encounter: Payer: Self-pay | Admitting: Registered"

## 2021-12-20 DIAGNOSIS — O24419 Gestational diabetes mellitus in pregnancy, unspecified control: Secondary | ICD-10-CM | POA: Insufficient documentation

## 2021-12-20 NOTE — Progress Notes (Signed)
Patient was seen on 12/19/21 for Gestational Diabetes self-management class at the [facility]. The following learning objectives were met by the patient during this course: ? ?States the definition of Gestational Diabetes ?States why dietary management is important in controlling blood glucose ?Describes the effects each nutrient has on blood glucose levels ?Demonstrates ability to create a balanced meal plan ?Demonstrates carbohydrate counting  ?States when to check blood glucose levels ?Demonstrates proper blood glucose monitoring techniques ?States the effect of stress and exercise on blood glucose levels ?States the importance of limiting caffeine and abstaining from alcohol and smoking ? ?Blood glucose monitor given: Accu-chek Guide Me ?Lot #[id number] ?Exp: 01/07/2023 ?CBG: 70 mg/dL  ? ?Patient instructed to monitor glucose levels: ?FBS: 60 - <95; 1 hour: <140; 2 hour: <120 ? ?Patient received handouts: ?Nutrition Diabetes and Pregnancy, including carb counting list ? ?Patient will be seen for follow-up as needed. ?

## 2021-12-27 ENCOUNTER — Inpatient Hospital Stay (HOSPITAL_COMMUNITY): Payer: No Typology Code available for payment source

## 2021-12-27 ENCOUNTER — Inpatient Hospital Stay (HOSPITAL_BASED_OUTPATIENT_CLINIC_OR_DEPARTMENT_OTHER): Payer: No Typology Code available for payment source

## 2021-12-27 ENCOUNTER — Other Ambulatory Visit: Payer: Self-pay

## 2021-12-27 ENCOUNTER — Inpatient Hospital Stay (HOSPITAL_COMMUNITY)
Admission: AD | Admit: 2021-12-27 | Discharge: 2021-12-30 | DRG: 787 | Disposition: A | Discharge status: Home / Self Care | Payer: No Typology Code available for payment source | Attending: Obstetrics and Gynecology | Admitting: Obstetrics and Gynecology

## 2021-12-27 DIAGNOSIS — Z20822 Contact with and (suspected) exposure to covid-19: Secondary | ICD-10-CM | POA: Diagnosis present

## 2021-12-27 DIAGNOSIS — O365930 Maternal care for other known or suspected poor fetal growth, third trimester, not applicable or unspecified: Secondary | ICD-10-CM | POA: Diagnosis present

## 2021-12-27 DIAGNOSIS — O358XX0 Maternal care for other (suspected) fetal abnormality and damage, not applicable or unspecified: Secondary | ICD-10-CM | POA: Diagnosis not present

## 2021-12-27 DIAGNOSIS — O1493 Unspecified pre-eclampsia, third trimester: Principal | ICD-10-CM

## 2021-12-27 DIAGNOSIS — O4103X0 Oligohydramnios, third trimester, not applicable or unspecified: Secondary | ICD-10-CM | POA: Diagnosis present

## 2021-12-27 DIAGNOSIS — Z3A32 32 weeks gestation of pregnancy: Secondary | ICD-10-CM

## 2021-12-27 DIAGNOSIS — O24425 Gestational diabetes mellitus in childbirth, controlled by oral hypoglycemic drugs: Secondary | ICD-10-CM | POA: Diagnosis present

## 2021-12-27 DIAGNOSIS — O99354 Diseases of the nervous system complicating childbirth: Secondary | ICD-10-CM | POA: Diagnosis present

## 2021-12-27 DIAGNOSIS — O321XX0 Maternal care for breech presentation, not applicable or unspecified: Secondary | ICD-10-CM | POA: Diagnosis present

## 2021-12-27 DIAGNOSIS — O164 Unspecified maternal hypertension, complicating childbirth: Secondary | ICD-10-CM | POA: Diagnosis not present

## 2021-12-27 DIAGNOSIS — O1424 HELLP syndrome, complicating childbirth: Principal | ICD-10-CM | POA: Diagnosis present

## 2021-12-27 DIAGNOSIS — Z3A33 33 weeks gestation of pregnancy: Secondary | ICD-10-CM

## 2021-12-27 DIAGNOSIS — Z98891 History of uterine scar from previous surgery: Secondary | ICD-10-CM

## 2021-12-27 DIAGNOSIS — G35 Multiple sclerosis: Secondary | ICD-10-CM | POA: Diagnosis present

## 2021-12-27 DIAGNOSIS — R030 Elevated blood-pressure reading, without diagnosis of hypertension: Secondary | ICD-10-CM | POA: Diagnosis present

## 2021-12-27 LAB — CBC WITH DIFFERENTIAL/PLATELET
Abs Immature Granulocytes: 0.06 10*3/uL (ref 0.00–0.07)
Basophils Absolute: 0 10*3/uL (ref 0.0–0.1)
Basophils Relative: 0 %
Eosinophils Absolute: 0.1 10*3/uL (ref 0.0–0.5)
Eosinophils Relative: 1 %
HCT: 34.4 % — ABNORMAL LOW (ref 36.0–46.0)
Hemoglobin: 12 g/dL (ref 12.0–15.0)
Immature Granulocytes: 1 %
Lymphocytes Relative: 15 %
Lymphs Abs: 1.3 10*3/uL (ref 0.7–4.0)
MCH: 26.5 pg (ref 26.0–34.0)
MCHC: 34.9 g/dL (ref 30.0–36.0)
MCV: 76.1 fL — ABNORMAL LOW (ref 80.0–100.0)
Monocytes Absolute: 0.8 10*3/uL (ref 0.1–1.0)
Monocytes Relative: 9 %
Neutro Abs: 6.4 10*3/uL (ref 1.7–7.7)
Neutrophils Relative %: 74 %
Platelets: 146 10*3/uL — ABNORMAL LOW (ref 150–400)
RBC: 4.52 MIL/uL (ref 3.87–5.11)
RDW: 14.3 % (ref 11.5–15.5)
WBC: 8.7 10*3/uL (ref 4.0–10.5)
nRBC: 0 % (ref 0.0–0.2)

## 2021-12-27 LAB — COMPREHENSIVE METABOLIC PANEL
ALT: 173 U/L — ABNORMAL HIGH (ref 0–44)
AST: 111 U/L — ABNORMAL HIGH (ref 15–41)
Albumin: 2.7 g/dL — ABNORMAL LOW (ref 3.5–5.0)
Alkaline Phosphatase: 150 U/L — ABNORMAL HIGH (ref 38–126)
Anion gap: 9 (ref 5–15)
BUN: 7 mg/dL (ref 6–20)
CO2: 23 mmol/L (ref 22–32)
Calcium: 9.5 mg/dL (ref 8.9–10.3)
Chloride: 104 mmol/L (ref 98–111)
Creatinine, Ser: 0.68 mg/dL (ref 0.44–1.00)
GFR, Estimated: >60 mL/min (ref >60)
Glucose, Bld: 79 mg/dL (ref 70–99)
Potassium: 3.9 mmol/L (ref 3.5–5.1)
Sodium: 136 mmol/L (ref 135–145)
Total Bilirubin: 0.4 mg/dL (ref 0.3–1.2)
Total Protein: 6 g/dL — ABNORMAL LOW (ref 6.5–8.1)

## 2021-12-27 LAB — PROTEIN / CREATININE RATIO, URINE
Creatinine, Urine: 112.46 mg/dL
Protein Creatinine Ratio: 0.18 mg/mg{Cre} — ABNORMAL HIGH (ref 0.00–0.15)
Total Protein, Urine: 20 mg/dL

## 2021-12-27 LAB — RESP PANEL BY RT-PCR (FLU A&B, COVID) ARPGX2
Influenza A by PCR: NEGATIVE
Influenza B by PCR: NEGATIVE
SARS Coronavirus 2 by RT PCR: NEGATIVE

## 2021-12-27 LAB — HEPATITIS B CORE ANTIBODY, TOTAL: Hep B Core Total Ab: NONREACTIVE

## 2021-12-27 LAB — TYPE AND SCREEN
ABO/RH(D): O POS
Antibody Screen: NEGATIVE

## 2021-12-27 LAB — GLUCOSE, CAPILLARY
Glucose-Capillary: 97 mg/dL (ref 70–99)
Glucose-Capillary: 142 mg/dL — ABNORMAL HIGH (ref 70–99)

## 2021-12-27 LAB — HEPATITIS A ANTIBODY, IGM: Hep A IgM: NONREACTIVE

## 2021-12-27 MED ORDER — DOCUSATE SODIUM 100 MG PO CAPS
100.0000 mg | ORAL_CAPSULE | Freq: Every day | ORAL | Status: DC
  Administered 2021-12-27: 15:00:00 100 mg via ORAL
  Filled 2021-12-27: qty 1

## 2021-12-27 MED ORDER — LACTATED RINGERS IV SOLN: INTRAVENOUS | Status: DC

## 2021-12-27 MED ORDER — BETAMETHASONE SOD PHOS & ACET 6 (3-3) MG/ML IJ SUSP
12.0000 mg | INTRAMUSCULAR | Status: AC
  Administered 2021-12-27 – 2021-12-28 (×2): 12 mg via INTRAMUSCULAR
  Filled 2021-12-27: qty 5

## 2021-12-27 MED ORDER — METFORMIN HCL 500 MG PO TABS: 500.0000 mg | ORAL_TABLET | Freq: Every day | ORAL | Status: DC

## 2021-12-27 MED ORDER — LABETALOL HCL 5 MG/ML IV SOLN: 40.0000 mg | INTRAVENOUS | Status: DC | PRN

## 2021-12-27 MED ORDER — ACETAMINOPHEN 325 MG PO TABS
650.0000 mg | ORAL_TABLET | ORAL | Status: DC | PRN
  Administered 2021-12-28: 650 mg via ORAL
  Filled 2021-12-27: qty 2

## 2021-12-27 MED ORDER — CALCIUM CARBONATE ANTACID 500 MG PO CHEW: 2.0000 | CHEWABLE_TABLET | ORAL | Status: DC | PRN

## 2021-12-27 MED ORDER — MAGNESIUM SULFATE 40 GM/1000ML IV SOLN
2.0000 g/h | INTRAVENOUS | Status: DC
  Administered 2021-12-28: 2 g/h via INTRAVENOUS
  Filled 2021-12-27 (×2): qty 1000

## 2021-12-27 MED ORDER — HYDRALAZINE HCL 20 MG/ML IJ SOLN: 5.0000 mg | INTRAMUSCULAR | Status: DC | PRN

## 2021-12-27 MED ORDER — LABETALOL HCL 5 MG/ML IV SOLN: 20.0000 mg | INTRAVENOUS | Status: DC | PRN

## 2021-12-27 MED ORDER — PRENATAL MULTIVITAMIN CH
1.0000 | ORAL_TABLET | Freq: Every day | ORAL | Status: DC
  Administered 2021-12-27: 15:00:00 1 via ORAL
  Filled 2021-12-27: qty 1

## 2021-12-27 MED ORDER — MAGNESIUM SULFATE BOLUS VIA INFUSION
4.0000 g | Freq: Once | INTRAVENOUS | Status: AC
  Administered 2021-12-27: 17:00:00 4 g via INTRAVENOUS
  Filled 2021-12-27: qty 1000

## 2021-12-27 MED ORDER — HYDRALAZINE HCL 20 MG/ML IJ SOLN: 10.0000 mg | INTRAMUSCULAR | Status: DC | PRN

## 2021-12-27 MED ORDER — SODIUM CHLORIDE 0.9% FLUSH
3.0000 mL | Freq: Two times a day (BID) | INTRAVENOUS | Status: DC
  Administered 2021-12-27: 15:00:00 3 mL via INTRAVENOUS

## 2021-12-27 MED ORDER — SODIUM CHLORIDE 0.9 % IV SOLN: 250.0000 mL | INTRAVENOUS | Status: DC | PRN

## 2021-12-27 MED ORDER — SODIUM CHLORIDE 0.9% FLUSH: 3.0000 mL | INTRAVENOUS | Status: DC | PRN

## 2021-12-27 MED ORDER — ZOLPIDEM TARTRATE 5 MG PO TABS: 5.0000 mg | ORAL_TABLET | Freq: Every evening | ORAL | Status: DC | PRN

## 2021-12-27 NOTE — Progress Notes (Signed)
Pt comfortable. ? ?Today's Vitals  ? 12/27/21 1723 12/27/21 1732 12/27/21 1735 12/27/21 1741  ?BP: 127/85 137/81 131/78 136/86  ?Pulse: 88 88 87 89  ?Resp: 16     ?Temp:      ?TempSrc:      ?SpO2: 99% 97% 98% 97%  ?Weight:      ?Height:      ?PainSc: 0-No pain     ? ?Body mass index is 36.26 kg/m?. ? ?FHTs 130s gSTV, NST R, cat 1 ?Toco rare ? ?Plt 146 ?H/H 12/34 ? ?Hep B neg ?Hep C neg ? ?MFM [place] 1710 gm, 3#12, 6 %ile.  Head measuring small again (last [place] in office mentioned small BPD too.) ?Breech, AFI 7.5, 4 cm pocket. ?BPP 8/10 with NST R.  Dopplers normal.   ? ? ?Appreciate Dr. Ivar Bury recommendations.  Currently pt is stable enough to allow for BMZ administration.  Pt is on magnesium per his suggestion. ?Breech, pt has been counseled that plan is for delivery by cesarean. ? ?First sugar 97, on metformin.  GBS pending.  ? ? ? ? ?

## 2021-12-27 NOTE — Consult Note (Addendum)
MFM Consultation ? ?Video visit. I met with Ms. Picone telephonically. She was in [facility] and I was located in a outpatient clinic. Two identifiers were used to confirm Ms. Grahams identity. I reviewed the risk and benefits of a telephonic visit she was agreement with this modality for this consultation.  ? ?Ms. Hoffmeister is a 36 yo G2P0 who is at 23 w 6d with an EDD of 02/15/22. She is admitted yesterday with a new onset elevated blood pressure and elevated LFT's and new onset platelets at 146.. ? ?Ms. Grahams pregnancy is complicated by B7SEG and multiple sclerosis. She has received serial growth exams for marginal cord insertion. Both conditions are under control with her most recent visit with neurology on 12/05/21.. ? ?I discussed with Ms. Lobb her current symptoms. She is currently asymptomatic. She denies ever having a headache, vision changes or n/v. ? ?She had low risk NIPS  ? ?Vitals with BMI 12/27/2021 12/27/2021 12/05/2021  ?Height - 5' 11" 5' 11"  ?Weight - 260 lbs 250 lbs  ?BMI - 36.28 34.88  ?Systolic 315 176 160  ?Diastolic 86 79 80  ?Pulse 83 84 90  ? ?CMP Latest Ref Rng & Units 12/27/2021 03/09/2021 08/02/2020  ?Glucose 70 - 99 mg/dL 79 125(H) 110(H)  ?BUN 6 - 20 mg/dL _0 ?Creatinine 0.44 - 1.00 mg/dL 0.68 0.71 0.72  ?Sodium 135 - 145 mmol/L 136 135 143  ?Potassium 3.5 - 5.1 mmol/L 3.9 3.6 3.7  ?Chloride 98 - 111 mmol/L 104 104 103  ?CO2 22 - 32 mmol/L _1 ?Calcium 8.9 - 10.3 mg/dL 9.5 9.4 9.5  ?Total Protein 6.5 - 8.1 g/dL 6.0(L) 6.5 6.3  ?Total Bilirubin 0.3 - 1.2 mg/dL 0.4 0.3 0.3  ?Alkaline Phos 38 - 126 U/L 150(H) 107 126(H)  ?AST 15 - 41 U/L 111(H) 38 20  ?ALT 0 - 44 U/L 173(H) 67(H) 19  ? ?CBC Latest Ref Rng & Units 12/27/2021 03/09/2021 02/28/2021  ?WBC 4.0 - 10.5 K/uL 8.7 10.9(H) 8.2  ?Hemoglobin 12.0 - 15.0 g/dL 12.0 11.8(L) 11.7(L)  ?Hematocrit 36.0 - 46.0 % 34.4(L) 34.0(L) 34.5(L)  ?Platelets 150 - 400 K/uL 146(L) 193 183  ? ?OB History  ?Gravida Para Term Preterm AB Living  ?2  0 0 0 1 0  ?SAB IAB Ectopic Multiple Live Births  ?1 0 0 0 0  ?  ?# Outcome Date GA Lbr Len/2nd Weight Sex Delivery Anes PTL Lv  ?2 Current           ?1 SAB           ? ?Past Medical History:  ?Diagnosis Date  ? Anemia   ? Low back pain 01/20/2013  ? Multiple sclerosis ([place]) 01/12/2013  ? Trichomoniasis   ? ? ?Current Facility-Administered Medications (Endocrine & Metabolic):  ?  betamethasone acetate-betamethasone sodium phosphate (CELESTONE) injection 12 mg ? ? ? ? ? ? ?Current Facility-Administered Medications (Analgesics):  ?  acetaminophen (TYLENOL) tablet 650 mg ? ? ? ? ?Current Facility-Administered Medications (Other):  ?  0.9 %  sodium chloride infusion ?  calcium carbonate (TUMS - dosed in mg elemental calcium) chewable tablet 400 mg of elemental calcium ?  docusate sodium (COLACE) capsule 100 mg ?  prenatal multivitamin tablet 1 tablet ?  sodium chloride flush (NS) 0.9 % injection 3 mL ?  sodium chloride flush (NS) 0.9 % injection 3 mL ?  zolpidem (AMBIEN) tablet 5 mg ? ? ?Facility-Administered Medications Ordered in Other  Encounters (Other):  ?  gadopentetate dimeglumine (MAGNEVIST) injection 20 mL ?No current outpatient medications on file. ?No Known Allergies ? ?Imaging: ?Growth pending. ? ? ?Impression: ? Rule out HELLP syndrome vs GHTN ? ?Counseling: ? ?I discussed with Ms. Dewan the finding of elevated blood pressure with LFT's with > 2 x normal. ? ?She is doing well with overall mild blood pressures. UPC, 24 hour urine, repeat CBC and CMP are being performed. Her most recent blood pressure was 138/86 mmHg.  ? ?Additional work up includes other causes of elevated LFT's such as infection. Dr. Horvath told me she was ordering an infectious panel to include ruling out Hepatitis.  ? ?I discussed with Ms. Russom that elevated blood pressures with elevated LFT's meets the diagnosis of HELLP syndrome, when no other causes can be identified. I explained that HELLP syndrome is cured by delivery, otherwise if  expectant management is pursued worsening disease can occur resulting in death of mother and or fetus depending on how rapidly developing.  ? ?Given this I recommend initiating Magnesium Sulfate and BMZ until the labs return and trend normal. BMZ can improve LFT's so I am expecting them to remain the same or to increased.  ? ?Ms. Basu was seen by dietician who is planning to initiate insulin while hospitalized for better control with BMS administration. ? ?If LFT's continue to rise we recommend delivery movement toward delivery after steroid administration given her gestational age. Cesarean delivery for obstetrical indications. ? ?NICU consultation should also be performed. ? ?If LFT's return to normal consider with normalized blood pressure outpatient management may be considered after at least 48 hours of observation. ? ?Continuous monitoring per protocol. ? ?Repeat labs every 6 hours. ? ?I discussed this plan of care with Dr. Horvath who is in agreement.  ? ?All questions answered. ? ?I spent 30 minutes with > 50% in direct communication, counseling and care coordination with Ms. Chiusano and Dr. Horvath. ? ?Corenthian J. Booker, MD. ? ? ?

## 2021-12-27 NOTE — Plan of Care (Signed)
POC discussed with pt. Questions were answered to pts satisfaction. Patient verbalized understanding. ?

## 2021-12-27 NOTE — Progress Notes (Signed)
Inpatient Diabetes Program Recommendations ? ?Diabetes Treatment Program Recommendations ? ?ADA Standards of Care ?Diabetes in Pregnancy Target Glucose Ranges: ? ?Fasting: 70 - 95 mg/dL ?1 hr postprandial: Less than 140mg /dL (from first bite of meal) ?2 hr postprandial: Less than 120 mg/dL (from first bite of meal)   ? ? ? Latest Reference Range & Units 12/27/21 12:48  ?Glucose 70 - 99 mg/dL 79  ? ? ?Review of Glycemic Control ? ?Diabetes history: GDM; [name] ?Outpatient Diabetes medications: None on listed on home med list but per H&P, notes patient recently started on Metformin PRN for elevated fastings ?Current orders for Inpatient glycemic control: CBGs fasting and 2 hour post prandial; Betamethasone 12 mg Q24H x2 ? ?Inpatient Diabetes Program Recommendations:   ? ?Insulin: Please consider ordering Novolog 0-14 units QID (fasting and 2 hour post prandial). ? ?NOTE: Noted consult for diabetes coordinator. Per chart, patient with GDM hx, [name] gestation being admitted for observation currently secondary elevated blood pressures and liver function tests preterm. Per note by 02/26/22 on 12/19/21, [facility] seen at [facility] for gestational diabetes self management class; given accu-check meter, and given glucose goals of FBS: 60 - <95; 1 hour: <140; 2 hour: <120. Noted patient is ordered Betamethasone which will likely contribute to hyperglycemia. Would recommend ordering Novolog correction scale QID (fasting and 2 hour post prandial). Inpatient diabetes team will continue to follow along while inpatient. ? ?Thanks, ?02/18/22, RN, MSN, CDE ?Diabetes Coordinator ?Inpatient Diabetes Program ?[phone] (Team Pager from 8am to 5pm) ? ? ? ?

## 2021-12-27 NOTE — Consult Note (Signed)
Neonatology Consult  Note: ? ?At the request of the patients obstetrician Dr. Philis Pique I met with Carol [name] who is a 36 y.o. [redacted]w[redacted]d G2P0010 admitted for elevated blood pressures and elevated LFTs.  Pregnancy also complicated by [id number] AMA.  She is being managed with BMZ and magnesium sulfate.  Baby is in breech position and plan is for delivery by cesarean. ? ?We reviewed initial delivery room management, including CPAP, George, and low but certainly possible need for intubation for surfactant administration.  We discussed feeding immaturity and need for full po intake with multiple days of good weight gain and no apnea or bradycardia before discharge.  We reviewed increased risk of jaundice, infection, and temperature instability.   ?Discussed likely length of stay. ? ?Thank you for allowing [facility] participate in her care.  Please call with questions. ? ?BHiginio Roger DO  ?Neonatologist ? ?The total length of face-to-face or floor / unit time for this encounter was 35 minutes.  Counseling and / or coordination of care was greater than fifty percent of the time. ? ? ? ?

## 2021-12-27 NOTE — H&P (Signed)
36 y.o. [redacted]w[redacted]d  G2P0010 admitted for observation currently secondary elevated blood pressures and liver function tests preterm.  Otherwise has good fetal movement and no bleeding. ? ? ?Past Medical History:  ?Diagnosis Date  ? Anemia   ? Low back pain 01/20/2013  ? Multiple sclerosis ([place]) 01/12/2013  ? Trichomoniasis   ?  ?Past Surgical History:  ?Procedure Laterality Date  ? LAPAROSCOPY Right 02/28/2021  ? Procedure: LAPAROSCOPY DIAGNOSTIC;  Surgeon: Rowland Lathe, MD;  Location: [place];  Service: Gynecology;  Laterality: Right;  ?  ?OB History  ?Gravida Para Term Preterm AB Living  ?2 0 0 0 1 0  ?SAB IAB Ectopic Multiple Live Births  ?1 0 0 0    ?  ?# Outcome Date GA Lbr Len/2nd Weight Sex Delivery Anes PTL Lv  ?2 Current           ?1 SAB           ?  ?Social History  ? ?Socioeconomic History  ? Marital status: Married  ?  Spouse name: Not on file  ? Number of children: 0  ? Years of education: 8  ? Highest education level: Not on file  ?Occupational History  ?  Comment: [employer]  ?Tobacco Use  ? Smoking status: Never  ? Smokeless tobacco: Never  ?Vaping Use  ? Vaping Use: Never used  ?Substance and Sexual Activity  ? Alcohol use: No  ? Drug use: Never  ? Sexual activity: Yes  ?  Partners: Male  ?  Birth control/protection: None  ?Other Topics Concern  ? Not on file  ?Social History Narrative  ? Patient is working at [employer] time., lives with her husband, no children.  ? Patient is right handed.  ? Patient has a high school education and some college.  ? Patient does not drink caffeine.  ? ?Social Determinants of Health  ? ?Financial Resource Strain: Not on file  ?Food Insecurity: No Food Insecurity  ? Worried About Charity fundraiser in the Last Year: Never true  ? Ran Out of Food in the Last Year: Never true  ?Transportation Needs: Not on file  ?Physical Activity: Not on file  ?Stress: Not on file  ?Social Connections: Not on file  ?Intimate Partner Violence: Not on file  ? Patient  has no known allergies.  ? ? ?Prenatal Transfer Tool  ?Maternal Diabetes: recently started on metformin pm for elevated fastings ?Genetic Screening: Normal ?Maternal Ultrasounds/Referrals: Normal ?Fetal Ultrasounds or other Referrals:  Referred to Materal Fetal Medicine  for hx of MS; pt to have q 4 weeks EFW.  Last EFW 02-13. ?Maternal Substance Abuse:  No ?Significant Maternal Medications:  Meds include: Other: metformin, pt stopped MS meds. ?Significant Maternal Lab Results: Other:  Pt is alpha thal and hbc C;  FOB also silent carrier of alpha thalassemia. Spoke with [facility]: worst case child may have mild anemia if trans carrier of alpha thalassemia ? ?Other PNC: uncomplicated to this point.  Pt is on baby asa.  Marginal cord insertion. ? ? ? ?Vitals:  ? 12/27/21 1312  ?BP: (!) 142/79  ?Pulse: 84  ?Resp: 16  ?Temp: 98.4 ?F (36.9 ?C)  ?TempSrc: Oral  ?SpO2: 100%  ?Weight: 117.9 kg  ?Height: 5\' 11"  (1.803 m)  ? ? ?Lungs/Cor:  NAD ?Abdomen:  soft, gravid ?Ex:  no cords, erythema ?SVE:  deferred ?FHTs:  120, good STV, NST R; Cat 1 tracing. ?Toco:  q rare ? ?  Results for orders placed or performed during the hospital encounter of 12/27/21 (from the past 24 hour(s))  ?Comprehensive metabolic panel     Status: Abnormal  ? Collection Time: 12/27/21 12:48 PM  ?Result Value Ref Range  ? Sodium 136 135 - 145 mmol/L  ? Potassium 3.9 3.5 - 5.1 mmol/L  ? Chloride 104 98 - 111 mmol/L  ? CO2 23 22 - 32 mmol/L  ? Glucose, Bld 79 70 - 99 mg/dL  ? BUN 7 6 - 20 mg/dL  ? Creatinine, Ser 0.68 0.44 - 1.00 mg/dL  ? Calcium 9.5 8.9 - 10.3 mg/dL  ? Total Protein 6.0 (L) 6.5 - 8.1 g/dL  ? Albumin 2.7 (L) 3.5 - 5.0 g/dL  ? AST 111 (H) 15 - 41 U/L  ? ALT 173 (H) 0 - 44 U/L  ? Alkaline Phosphatase 150 (H) 38 - 126 U/L  ? Total Bilirubin 0.4 0.3 - 1.2 mg/dL  ? GFR, Estimated >60 >60 mL/min  ? Anion gap 9 5 - 15  ?  ? ? ?A/P   36 y.o. G2P0010 [redacted]w[redacted]d A2GDM, AMA with increasing LFTs with mildly elevated blood pressures.  Pt was negative early  pregnancy for hepatitis B and C and has not had any exposures since but will rescreen to rule out infectious cause for elevated LFTs. ?Will check hepatitis labs and recheck CMP and CBC tomorrow am. ?Serial BPs and continuous monitoring for now. ?MFM reconsult for growth, position and recommendations for delivery. ?Betamethasone x2, GBS in case of delivery. ?Continue metformin and CBGs 4 times a day. ? ?Carol [name] ?

## 2021-12-28 ENCOUNTER — Encounter: Payer: Self-pay | Admitting: Neurology

## 2021-12-28 ENCOUNTER — Inpatient Hospital Stay (HOSPITAL_COMMUNITY): Payer: No Typology Code available for payment source | Admitting: Anesthesiology

## 2021-12-28 ENCOUNTER — Other Ambulatory Visit: Payer: Self-pay

## 2021-12-28 ENCOUNTER — Encounter (HOSPITAL_COMMUNITY)
Admission: AD | Disposition: A | Discharge status: Home / Self Care | Payer: Self-pay | Source: Home / Self Care | Attending: Obstetrics and Gynecology

## 2021-12-28 ENCOUNTER — Encounter (HOSPITAL_COMMUNITY): Payer: Self-pay | Admitting: Obstetrics and Gynecology

## 2021-12-28 DIAGNOSIS — Z3A33 33 weeks gestation of pregnancy: Secondary | ICD-10-CM

## 2021-12-28 DIAGNOSIS — O321XX0 Maternal care for breech presentation, not applicable or unspecified: Secondary | ICD-10-CM

## 2021-12-28 DIAGNOSIS — O365930 Maternal care for other known or suspected poor fetal growth, third trimester, not applicable or unspecified: Secondary | ICD-10-CM

## 2021-12-28 DIAGNOSIS — O164 Unspecified maternal hypertension, complicating childbirth: Secondary | ICD-10-CM

## 2021-12-28 LAB — CBC WITH DIFFERENTIAL/PLATELET
Abs Immature Granulocytes: 0.15 10*3/uL — ABNORMAL HIGH (ref 0.00–0.07)
Basophils Absolute: 0 10*3/uL (ref 0.0–0.1)
Basophils Relative: 0 %
Eosinophils Absolute: 0 10*3/uL (ref 0.0–0.5)
Eosinophils Relative: 0 %
HCT: 33.1 % — ABNORMAL LOW (ref 36.0–46.0)
Hemoglobin: 11.7 g/dL — ABNORMAL LOW (ref 12.0–15.0)
Immature Granulocytes: 1 %
Lymphocytes Relative: 10 %
Lymphs Abs: 1.2 10*3/uL (ref 0.7–4.0)
MCH: 26.8 pg (ref 26.0–34.0)
MCHC: 35.3 g/dL (ref 30.0–36.0)
MCV: 75.7 fL — ABNORMAL LOW (ref 80.0–100.0)
Monocytes Absolute: 1 10*3/uL (ref 0.1–1.0)
Monocytes Relative: 8 %
Neutro Abs: 9.8 10*3/uL — ABNORMAL HIGH (ref 1.7–7.7)
Neutrophils Relative %: 81 %
Platelets: 157 10*3/uL (ref 150–400)
RBC: 4.37 MIL/uL (ref 3.87–5.11)
RDW: 14.2 % (ref 11.5–15.5)
WBC: 12.2 10*3/uL — ABNORMAL HIGH (ref 4.0–10.5)
nRBC: 0.3 % — ABNORMAL HIGH (ref 0.0–0.2)

## 2021-12-28 LAB — COMPREHENSIVE METABOLIC PANEL
ALT: 233 U/L — ABNORMAL HIGH (ref 0–44)
AST: 155 U/L — ABNORMAL HIGH (ref 15–41)
Albumin: 2.7 g/dL — ABNORMAL LOW (ref 3.5–5.0)
Alkaline Phosphatase: 132 U/L — ABNORMAL HIGH (ref 38–126)
Anion gap: 8 (ref 5–15)
BUN: 7 mg/dL (ref 6–20)
CO2: 20 mmol/L — ABNORMAL LOW (ref 22–32)
Calcium: 8.5 mg/dL — ABNORMAL LOW (ref 8.9–10.3)
Chloride: 105 mmol/L (ref 98–111)
Creatinine, Ser: 0.69 mg/dL (ref 0.44–1.00)
GFR, Estimated: >60 mL/min (ref >60)
Glucose, Bld: 107 mg/dL — ABNORMAL HIGH (ref 70–99)
Potassium: 3.9 mmol/L (ref 3.5–5.1)
Sodium: 133 mmol/L — ABNORMAL LOW (ref 135–145)
Total Bilirubin: 0.4 mg/dL (ref 0.3–1.2)
Total Protein: 6.1 g/dL — ABNORMAL LOW (ref 6.5–8.1)

## 2021-12-28 LAB — CBC
HCT: 32.8 % — ABNORMAL LOW (ref 36.0–46.0)
Hemoglobin: 11.4 g/dL — ABNORMAL LOW (ref 12.0–15.0)
MCH: 26.4 pg (ref 26.0–34.0)
MCHC: 34.8 g/dL (ref 30.0–36.0)
MCV: 75.9 fL — ABNORMAL LOW (ref 80.0–100.0)
Platelets: 165 10*3/uL (ref 150–400)
RBC: 4.32 MIL/uL (ref 3.87–5.11)
RDW: 14.4 % (ref 11.5–15.5)
WBC: 11.6 10*3/uL — ABNORMAL HIGH (ref 4.0–10.5)
nRBC: 0.6 % — ABNORMAL HIGH (ref 0.0–0.2)

## 2021-12-28 LAB — GLUCOSE, CAPILLARY
Glucose-Capillary: 101 mg/dL — ABNORMAL HIGH (ref 70–99)
Glucose-Capillary: 104 mg/dL — ABNORMAL HIGH (ref 70–99)
Glucose-Capillary: 162 mg/dL — ABNORMAL HIGH (ref 70–99)

## 2021-12-28 LAB — HCV INTERPRETATION

## 2021-12-28 LAB — HCV AB W REFLEX TO QUANT PCR: HCV Ab: NONREACTIVE

## 2021-12-28 SURGERY — Surgical Case
Anesthesia: Spinal

## 2021-12-28 MED ORDER — SIMETHICONE 80 MG PO CHEW: 80.0000 mg | CHEWABLE_TABLET | ORAL | Status: DC | PRN

## 2021-12-28 MED ORDER — PRENATAL MULTIVITAMIN CH
1.0000 | ORAL_TABLET | Freq: Every day | ORAL | Status: DC
  Administered 2021-12-29 – 2021-12-30 (×2): 1 via ORAL
  Filled 2021-12-28 (×2): qty 1

## 2021-12-28 MED ORDER — CEFAZOLIN SODIUM-DEXTROSE 2-4 GM/100ML-% IV SOLN
2.0000 g | INTRAVENOUS | Status: AC
  Administered 2021-12-28: 2 g via INTRAVENOUS

## 2021-12-28 MED ORDER — HYDROMORPHONE HCL 1 MG/ML IJ SOLN: 0.2500 mg | INTRAMUSCULAR | Status: DC | PRN

## 2021-12-28 MED ORDER — OXYTOCIN-SODIUM CHLORIDE 30-0.9 UT/500ML-% IV SOLN
INTRAVENOUS | Status: DC | PRN
Start: 2021-12-28 — End: 2021-12-28

## 2021-12-28 MED ORDER — OXYCODONE HCL 5 MG PO TABS: 5.0000 mg | ORAL_TABLET | ORAL | Status: DC | PRN

## 2021-12-28 MED ORDER — DIPHENHYDRAMINE HCL 25 MG PO CAPS: 25.0000 mg | ORAL_CAPSULE | ORAL | Status: DC | PRN

## 2021-12-28 MED ORDER — IBUPROFEN 600 MG PO TABS
600.0000 mg | ORAL_TABLET | Freq: Four times a day (QID) | ORAL | Status: DC
  Administered 2021-12-29 – 2021-12-30 (×3): 600 mg via ORAL
  Filled 2021-12-28 (×4): qty 1

## 2021-12-28 MED ORDER — PHENYLEPHRINE 40 MCG/ML (10ML) SYRINGE FOR IV PUSH (FOR BLOOD PRESSURE SUPPORT)
PREFILLED_SYRINGE | INTRAVENOUS | Status: AC
  Filled 2021-12-28: qty 10

## 2021-12-28 MED ORDER — PHENYLEPHRINE HCL-NACL 20-0.9 MG/250ML-% IV SOLN
INTRAVENOUS | Status: DC | PRN
Start: 2021-12-28 — End: 2021-12-28
  Administered 2021-12-28: 30 ug/min via INTRAVENOUS

## 2021-12-28 MED ORDER — KETOROLAC TROMETHAMINE 30 MG/ML IJ SOLN
INTRAMUSCULAR | Status: AC
  Filled 2021-12-28: qty 1

## 2021-12-28 MED ORDER — DIPHENHYDRAMINE HCL 25 MG PO CAPS: 25.0000 mg | ORAL_CAPSULE | Freq: Four times a day (QID) | ORAL | Status: DC | PRN

## 2021-12-28 MED ORDER — SODIUM CHLORIDE 0.9% FLUSH: 3.0000 mL | INTRAVENOUS | Status: DC | PRN

## 2021-12-28 MED ORDER — METHYLERGONOVINE MALEATE 0.2 MG/ML IJ SOLN
INTRAMUSCULAR | Status: AC
  Filled 2021-12-28: qty 1

## 2021-12-28 MED ORDER — MEPERIDINE HCL 25 MG/ML IJ SOLN: 6.2500 mg | INTRAMUSCULAR | Status: DC | PRN

## 2021-12-28 MED ORDER — MORPHINE SULFATE (PF) 0.5 MG/ML IJ SOLN
INTRAMUSCULAR | Status: DC | PRN
  Administered 2021-12-28: 150 ug via INTRATHECAL

## 2021-12-28 MED ORDER — ONDANSETRON HCL 4 MG/2ML IJ SOLN
INTRAMUSCULAR | Status: DC | PRN
Start: 2021-12-28 — End: 2021-12-28
  Administered 2021-12-28: 4 mg via INTRAVENOUS

## 2021-12-28 MED ORDER — SCOPOLAMINE 1 MG/3DAYS TD PT72
1.0000 | MEDICATED_PATCH | Freq: Once | TRANSDERMAL | Status: DC
  Administered 2021-12-28: 1.5 mg via TRANSDERMAL

## 2021-12-28 MED ORDER — KETOROLAC TROMETHAMINE 30 MG/ML IJ SOLN
INTRAMUSCULAR | Status: DC | PRN
Start: 2021-12-28 — End: 2021-12-28
  Administered 2021-12-28: 30 mg via INTRAVENOUS

## 2021-12-28 MED ORDER — FENTANYL CITRATE (PF) 100 MCG/2ML IJ SOLN
INTRAMUSCULAR | Status: AC
  Filled 2021-12-28: qty 2

## 2021-12-28 MED ORDER — ONDANSETRON HCL 4 MG/2ML IJ SOLN
INTRAMUSCULAR | Status: AC
  Filled 2021-12-28: qty 2

## 2021-12-28 MED ORDER — COCONUT OIL OIL: 1.0000 "application " | TOPICAL_OIL | Status: DC | PRN

## 2021-12-28 MED ORDER — NALOXONE HCL 4 MG/10ML IJ SOLN
1.0000 ug/kg/h | INTRAVENOUS | Status: DC | PRN
  Filled 2021-12-28: qty 5

## 2021-12-28 MED ORDER — WITCH HAZEL-GLYCERIN EX PADS: 1.0000 "application " | MEDICATED_PAD | CUTANEOUS | Status: DC | PRN

## 2021-12-28 MED ORDER — SOD CITRATE-CITRIC ACID 500-334 MG/5ML PO SOLN
30.0000 mL | ORAL | Status: AC
  Administered 2021-12-28: 30 mL via ORAL
  Filled 2021-12-28: qty 30

## 2021-12-28 MED ORDER — SENNOSIDES-DOCUSATE SODIUM 8.6-50 MG PO TABS
2.0000 | ORAL_TABLET | Freq: Every day | ORAL | Status: DC
  Administered 2021-12-29 – 2021-12-30 (×2): 2 via ORAL
  Filled 2021-12-28: qty 2

## 2021-12-28 MED ORDER — LACTATED RINGERS IV SOLN
INTRAVENOUS | Status: DC
Start: 2021-12-28 — End: 2021-12-30
  Administered 2021-12-28: 75 mL/h via INTRAVENOUS

## 2021-12-28 MED ORDER — SIMETHICONE 80 MG PO CHEW
80.0000 mg | CHEWABLE_TABLET | Freq: Three times a day (TID) | ORAL | Status: DC
  Administered 2021-12-29 – 2021-12-30 (×4): 80 mg via ORAL
  Filled 2021-12-28 (×4): qty 1

## 2021-12-28 MED ORDER — MAGNESIUM SULFATE 40 GM/1000ML IV SOLN
2.0000 g/h | INTRAVENOUS | Status: AC
  Administered 2021-12-28 – 2021-12-29 (×2): 2 g/h via INTRAVENOUS
  Filled 2021-12-28: qty 1000

## 2021-12-28 MED ORDER — NALOXONE HCL 0.4 MG/ML IJ SOLN: 0.4000 mg | INTRAMUSCULAR | Status: DC | PRN

## 2021-12-28 MED ORDER — SCOPOLAMINE 1 MG/3DAYS TD PT72
MEDICATED_PATCH | TRANSDERMAL | Status: AC
  Filled 2021-12-28: qty 1

## 2021-12-28 MED ORDER — OXYTOCIN-SODIUM CHLORIDE 30-0.9 UT/500ML-% IV SOLN
INTRAVENOUS | Status: AC
  Filled 2021-12-28: qty 500

## 2021-12-28 MED ORDER — CEFAZOLIN SODIUM-DEXTROSE 2-4 GM/100ML-% IV SOLN
INTRAVENOUS | Status: AC
  Filled 2021-12-28: qty 100

## 2021-12-28 MED ORDER — DIBUCAINE (PERIANAL) 1 % EX OINT: 1.0000 "application " | TOPICAL_OINTMENT | CUTANEOUS | Status: DC | PRN

## 2021-12-28 MED ORDER — FENTANYL CITRATE (PF) 100 MCG/2ML IJ SOLN
INTRAMUSCULAR | Status: DC | PRN
  Administered 2021-12-28: 15 ug via INTRATHECAL

## 2021-12-28 MED ORDER — KETOROLAC TROMETHAMINE 30 MG/ML IJ SOLN
30.0000 mg | Freq: Four times a day (QID) | INTRAMUSCULAR | Status: AC
  Administered 2021-12-28 – 2021-12-29 (×3): 30 mg via INTRAVENOUS
  Filled 2021-12-28 (×3): qty 1

## 2021-12-28 MED ORDER — DEXAMETHASONE SODIUM PHOSPHATE 4 MG/ML IJ SOLN
INTRAMUSCULAR | Status: DC | PRN
  Administered 2021-12-28: 4 mg via INTRAVENOUS

## 2021-12-28 MED ORDER — OXYTOCIN 10 UNIT/ML IJ SOLN
INTRAMUSCULAR | Status: AC
  Filled 2021-12-28: qty 4

## 2021-12-28 MED ORDER — DEXAMETHASONE SODIUM PHOSPHATE 4 MG/ML IJ SOLN
INTRAMUSCULAR | Status: AC
Start: 2021-12-28 — End: ?
  Filled 2021-12-28: qty 1

## 2021-12-28 MED ORDER — METHYLERGONOVINE MALEATE 0.2 MG/ML IJ SOLN
INTRAMUSCULAR | Status: DC | PRN
  Administered 2021-12-28: .2 mg via INTRAMUSCULAR

## 2021-12-28 MED ORDER — PHENYLEPHRINE HCL-NACL 20-0.9 MG/250ML-% IV SOLN
INTRAVENOUS | Status: AC
  Filled 2021-12-28: qty 250

## 2021-12-28 MED ORDER — TRANEXAMIC ACID-NACL 1000-0.7 MG/100ML-% IV SOLN
INTRAVENOUS | Status: DC | PRN
  Administered 2021-12-28: 1000 mg via INTRAVENOUS

## 2021-12-28 MED ORDER — ONDANSETRON HCL 4 MG/2ML IJ SOLN: 4.0000 mg | Freq: Three times a day (TID) | INTRAMUSCULAR | Status: DC | PRN

## 2021-12-28 MED ORDER — OXYTOCIN-SODIUM CHLORIDE 30-0.9 UT/500ML-% IV SOLN
2.5000 [IU]/h | INTRAVENOUS | Status: AC
  Administered 2021-12-28: 2.5 [IU]/h via INTRAVENOUS
  Filled 2021-12-28: qty 500

## 2021-12-28 MED ORDER — MENTHOL 3 MG MT LOZG: 1.0000 | LOZENGE | OROMUCOSAL | Status: DC | PRN

## 2021-12-28 MED ORDER — PHENYLEPHRINE HCL (PRESSORS) 10 MG/ML IV SOLN
INTRAVENOUS | Status: DC | PRN
Start: 2021-12-28 — End: 2021-12-28
  Administered 2021-12-28 (×3): 80 ug via INTRAVENOUS

## 2021-12-28 MED ORDER — PROMETHAZINE HCL 25 MG/ML IJ SOLN: 6.2500 mg | INTRAMUSCULAR | Status: DC | PRN

## 2021-12-28 MED ORDER — TRANEXAMIC ACID-NACL 1000-0.7 MG/100ML-% IV SOLN
INTRAVENOUS | Status: AC
  Filled 2021-12-28: qty 100

## 2021-12-28 MED ORDER — OXYTOCIN-SODIUM CHLORIDE 30-0.9 UT/500ML-% IV SOLN
INTRAVENOUS | Status: DC | PRN
Start: 2021-12-28 — End: 2021-12-28
  Administered 2021-12-28: 300 mL via INTRAVENOUS

## 2021-12-28 MED ORDER — TETANUS-DIPHTH-ACELL PERTUSSIS 5-2.5-18.5 LF-MCG/0.5 IM SUSY: 0.5000 mL | PREFILLED_SYRINGE | Freq: Once | INTRAMUSCULAR | Status: DC

## 2021-12-28 MED ORDER — ZOLPIDEM TARTRATE 5 MG PO TABS: 5.0000 mg | ORAL_TABLET | Freq: Every evening | ORAL | Status: DC | PRN

## 2021-12-28 MED ORDER — ACETAMINOPHEN 500 MG PO TABS
1000.0000 mg | ORAL_TABLET | Freq: Four times a day (QID) | ORAL | Status: DC
  Administered 2021-12-28 – 2021-12-30 (×7): 1000 mg via ORAL
  Filled 2021-12-28 (×8): qty 2

## 2021-12-28 MED ORDER — MORPHINE SULFATE (PF) 0.5 MG/ML IJ SOLN
INTRAMUSCULAR | Status: AC
  Filled 2021-12-28: qty 10

## 2021-12-28 MED ORDER — DIPHENHYDRAMINE HCL 50 MG/ML IJ SOLN
12.5000 mg | INTRAMUSCULAR | Status: DC | PRN
Start: 2021-12-28 — End: 2021-12-30

## 2021-12-28 SURGICAL SUPPLY — 38 items
CHLORAPREP W/TINT 26ML (MISCELLANEOUS) ×2 IMPLANT
CLAMP CORD UMBIL (MISCELLANEOUS) IMPLANT
CLOTH BEACON ORANGE TIMEOUT ST (SAFETY) ×2 IMPLANT
DERMABOND ADVANCED (GAUZE/BANDAGES/DRESSINGS) ×1
DERMABOND ADVANCED .7 DNX12 (GAUZE/BANDAGES/DRESSINGS) ×1 IMPLANT
DRSG OPSITE POSTOP 4X10 (GAUZE/BANDAGES/DRESSINGS) ×2 IMPLANT
ELECT REM PT RETURN 9FT ADLT (ELECTROSURGICAL) ×2
ELECTRODE REM PT RTRN 9FT ADLT (ELECTROSURGICAL) ×1 IMPLANT
EXTRACTOR VACUUM BELL STYLE (SUCTIONS) IMPLANT
GAUZE SPONGE 4X4 12PLY STRL LF (GAUZE/BANDAGES/DRESSINGS) IMPLANT
GLOVE BIO SURGEON STRL SZ 6 (GLOVE) ×2 IMPLANT
GLOVE BIOGEL PI IND STRL 6.5 (GLOVE) ×1 IMPLANT
GLOVE BIOGEL PI IND STRL 7.0 (GLOVE) ×2 IMPLANT
GLOVE BIOGEL PI INDICATOR 6.5 (GLOVE) ×1
GLOVE BIOGEL PI INDICATOR 7.0 (GLOVE) ×2
GOWN STRL REUS W/TWL LRG LVL3 (GOWN DISPOSABLE) ×4 IMPLANT
HEMOSTAT ARISTA ABSORB 3G PWDR (HEMOSTASIS) ×1 IMPLANT
KIT ABG SYR 3ML LUER SLIP (SYRINGE) IMPLANT
NDL HYPO 25X5/8 SAFETYGLIDE (NEEDLE) IMPLANT
NEEDLE HYPO 25X5/8 SAFETYGLIDE (NEEDLE) IMPLANT
NS IRRIG 1000ML POUR BTL (IV SOLUTION) ×2 IMPLANT
PACK C SECTION WH (CUSTOM PROCEDURE TRAY) ×2 IMPLANT
PAD ABD 8X10 STRL (GAUZE/BANDAGES/DRESSINGS) IMPLANT
PAD OB MATERNITY 4.3X12.25 (PERSONAL CARE ITEMS) ×2 IMPLANT
PENCIL SMOKE EVAC W/HOLSTER (ELECTROSURGICAL) ×2 IMPLANT
RTRCTR C-SECT PINK 25CM LRG (MISCELLANEOUS) IMPLANT
SUT PDS AB 0 CTX 36 PDP370T (SUTURE) IMPLANT
SUT PLAIN 2 0 (SUTURE)
SUT PLAIN ABS 2-0 CT1 27XMFL (SUTURE) IMPLANT
SUT VIC AB 0 CT1 36 (SUTURE) ×6 IMPLANT
SUT VIC AB 2-0 CT1 27 (SUTURE) ×1
SUT VIC AB 2-0 CT1 TAPERPNT 27 (SUTURE) ×1 IMPLANT
SUT VIC AB 3-0 SH 27 (SUTURE)
SUT VIC AB 3-0 SH 27X BRD (SUTURE) IMPLANT
SUT VIC AB 4-0 KS 27 (SUTURE) ×2 IMPLANT
TOWEL OR 17X24 6PK STRL BLUE (TOWEL DISPOSABLE) ×2 IMPLANT
TRAY FOLEY W/BAG SLVR 14FR LF (SET/KITS/TRAYS/PACK) ×2 IMPLANT
WATER STERILE IRR 1000ML POUR (IV SOLUTION) ×2 IMPLANT

## 2021-12-28 NOTE — Lactation Note (Signed)
This note was copied from a baby's chart. ?Lactation Consultation Note ? ?Patient Name: Carol [name] ?YSAYT'K Date: 12/28/2021 ?  ?Age:36 hours ? ?Spoke to [facility] Special[facility] care RN Aurther Loft T. And she reported that mom intends to pump and provide breastmilk for her baby. She'll pass it on report to night shift to set her up with a pump. NICU LC services to F/U tomorrow for initial assessment.  ? ? ?Carol [name] ?12/28/2021, 6:58 PM ? ? ? ?

## 2021-12-28 NOTE — Progress Notes (Signed)
Patient stated that she will be bottle feeding not breastfeeding. ?

## 2021-12-28 NOTE — Progress Notes (Signed)
Inpatient Diabetes Program Recommendations ? ? ?Lab Results  ?Component Value Date  ? GLUCAP 142 (H) 12/27/2021  ? HGBA1C 5.2 03/03/2017  ? ?ADA Standards of Care 2023 ?Diabetes in Pregnancy Target Glucose Ranges: ? ?Fasting: 70 - 95 mg/dL ?1 hr postprandial:  110 - 140mg /dL (from first bite of meal) ?2 hr postprandial:  100 - 120 mg/dL (from first bit of meal)  ? ?Review of Glycemic Control ? Latest Reference Range & Units 12/27/21 16:53 12/27/21 23:12  ?Glucose-Capillary 70 - 99 mg/dL 97 02/26/22 (H)  ? ?Diabetes history: GDM; [name] ? ?Outpatient Diabetes medications: None on listed on home med list but per H&P, notes patient recently started on Metformin PRN for elevated fastings ?Current orders for Inpatient glycemic control: CBGs fasting and 2 hour post prandial; Betamethasone 12 mg Q24H x2 ?  ?Inpatient Diabetes Program Recommendations:   ?  ? Please consider ordering Novolog 0-14 units QID (fasting and 2 hour post prandial). ? ?Thanks,  ?[id number], RN, BC-ADM ?Inpatient Diabetes Coordinator ?Pager [phone]  (8a-5p) ? ? ?

## 2021-12-28 NOTE — Progress Notes (Signed)
AP PROGRESS NOTE ? ?S: patient laying in bed, talking to husband on phone, mild HA but just received Tylenol, otherwise denying PreE symptoms. Endorses +FM, denies VB, LOF, cramping. ?Reviewed repeat labs this AM showing bump in LFTs. Hepatitis panel ordered on admission is WNL. BP range has been 128-140/73-82 in last 24 hrs. 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 Last PO intake (liquids only) 0530. Currently NPO ? ?O: BP 128/73 (BP Location: Left Arm)   Pulse 89   Temp 98.5 ?F (36.9 ?C) (Oral)   Resp 16   Ht 5\' 11"  (1.803 m)   Wt 117.9 kg   LMP 04/26/2021   SpO2 100%   BMI 36.26 kg/m?  ?Gen: NAD, laying in bed, tired-appearing ?CV: CTAB, RRR ?Abd: Gravid, CEFM in place. Breech presentation confirmed ?GU deferred ?MSK: neg edema, calf TTP ?Psych/Neuro: AAOx3, DTR WNL ? ?Labs: 11.4/32.8/165, LFTs 155/233 (from 111/173 yesterday), Cr 0.69 ?GS 3/10: EFW 1710g/6th%tile, AC 8th%tile, AFI 7.5 w/ 4CM pocket ? ?NST reactive this AM, no activity on TOCO ? ?PLAN: Move forward with PLTCS ?-Continue MgSO4, given 2nd dose BMTZ early at 1200 ?-Plan to follow noon scheduled case in OR B on L&D, OR team notified ?-Stat repeat CBC at 1000 for updated platelet count (likely received bump from BMTZ dosing) ?-NPO at since 0530 ?-CEFM until case ?-NICU and anesthesia notified. Consent signed  ?R/B/A of cesarean section discussed with patient. Alternative would be vaginal delivery which would mean shorter postpartum stay and decreased risk of bleeding. Risks of section include infection of the uterus, pelvic organs, or skin, inadvertent injury to internal organs, such as bowel or bladder. If there is major injury, extensive surgery may be required. If injury is minor, it may be treated with relative ease. Discussed possibility of excessive blood loss and transfusion. If bleeding cannot be controlled using medical or minor surgical methods, a cesarean hysterectomy may be  performed which would mean no future fertility. Patient accepts the possibility of blood transfusion, if necessary. Patient understands and agrees to move forward with section. ? ?

## 2021-12-28 NOTE — Anesthesia Preprocedure Evaluation (Addendum)
Anesthesia Evaluation  ?Patient identified by MRN, date of birth, ID band ?Patient awake ? ? ? ?Reviewed: ?Allergy & Precautions, Patient's Chart, lab work & pertinent test results ? ?Airway ?Mallampati: II ? ?TM Distance: >3 FB ?Neck ROM: Full ? ? ? Dental ? ?(+) Dental Advisory Given, Teeth Intact ?  ?Pulmonary ?neg pulmonary ROS,  ?  ?Pulmonary exam normal ?breath sounds clear to auscultation ? ? ? ? ? ? Cardiovascular ?hypertension, Normal cardiovascular exam ?Rhythm:Regular Rate:Normal ? ? ?  ?Neuro/Psych ? Headaches,   ? GI/Hepatic ?negative GI ROS, Neg liver ROS,   ?Endo/Other  ?diabetes ? Renal/GU ?negative Renal ROS  ? ?  ?Musculoskeletal ?negative musculoskeletal ROS ?(+)  ? Abdominal ?(+) + obese,   ?Peds ? Hematology ? ?(+) Blood dyscrasia, anemia ,   ?Anesthesia Other Findings ? ? Reproductive/Obstetrics ? ?  ? ? ? ? ? ? ? ? ? ? ? ? ? ?  ?  ? ? ? ? ? ? ? ?Anesthesia Physical ?Anesthesia Plan ? ?ASA: 3 ? ?Anesthesia Plan: Spinal  ? ?Post-op Pain Management: Toradol IV (intra-op)* and Ofirmev IV (intra-op)*  ? ?Induction:  ? ?PONV Risk Score and Plan: Ondansetron, Dexamethasone, Scopolamine patch - Pre-op and Treatment may vary due to age or medical condition ? ?Airway Management Planned: Natural Airway ? ?Additional Equipment:  ? ?Intra-op Plan:  ? ?Post-operative Plan:  ? ?Informed Consent: I have reviewed the patients History and Physical, chart, labs and discussed the procedure including the risks, benefits and alternatives for the proposed anesthesia with the patient or authorized representative who has indicated his/her understanding and acceptance.  ? ? ? ?Dental advisory given ? ?Plan Discussed with: CRNA ? ?Anesthesia Plan Comments:   ? ? ? ? ? ?Anesthesia Quick Evaluation ? ?

## 2021-12-28 NOTE — Op Note (Signed)
C-Section Operative Note ? ?Date: 12/28/21  ?Preoperative Diagnosis: IUP @ 33 0/7, HELLP, GDMA2, IUGR @ 6th %tile, oligohydramnios ?Postoperative Diagnosis: same as above, uterine atony ?Procedure: PLTCS ?Indications: Worsening LFTs with breech presentation ?Findings: Viable female infant weighing 1830g with APGARS of 6 and 9 at 1 and 5 minutes, respectively. Normal appearing uterus, bilateral fallopian tubes and ovaries. ?Specimens: placenta to pathology ?EBL 1000cc ?IVF 600cc LR ?UOP 300cc clear yellow urine ? ?Patient Course: Patient admitted to antepartum for BP monitoring given outpatient findign of mildly elevated BP with LFTs 2x normal. Asymptomatic. MFM and NICU consults occurred, MgSO4 and BMTZ recommended. GDMA2 on PO metformin. Hepatitis panel ordered to rule out infectious etiologies. GS?Dopplers by MFM noted new diagnosis of IUGR @ 6th %tile with normal dopplesr but new oligo with AFI 7.5, BPP 6/8. Given that LFTs continued to rise on HD#2 and SIUP in persistent frank breech position, decision made to move forward with delivery at 33 0/7 via PLTCS.  ? ?Consent:  ?R/B/A of cesarean section discussed with patient. Alternative would be vaginal delivery which would mean shorter postpartum stay and decreased risk of bleeding. Risks of section include infection of the uterus, pelvic organs, or skin, inadvertent injury to internal organs, such as bowel or bladder. If there is major injury, extensive surgery may be required. If injury is minor, it may be treated with relative ease. Discussed possibility of excessive blood loss and transfusion. If bleeding cannot be controlled using medical or minor surgical methods, a cesarean hysterectomy may be performed which would mean no future fertility. Patient accepts the possibility of blood transfusion, if necessary. Patient understands and agrees to move forward with section.  ? ?Operative Procedure: ?Patient was taken to the operating room where epidural anesthesia  was found to be adequate by Allis clamp test. She was prepped and draped in the normal sterile fashion in the dorsal supine position with a leftward tilt. An appropriate time out was performed. A Pfannenstiel skin incision was then made with the scalpel and carried through to the underlying layer of fascia by sharp dissection and Bovie cautery. The fascia was nicked in the midline and the incision was extended laterally with Mayo scissors. The superior aspect of the incision was grasped Coker clamps and dissected off the underlying rectus muscles. In a similar fashion the inferior aspect was dissected off the rectus muscles. Rectus muscles were separated in the midline and the peritoneal cavity entered bluntly. The peritoneal incision was then extended both superiorly and inferiorly with careful attention to avoid both bowel and bladder. The Alexis self-retaining wound retractor was then placed within the incision and the lower uterine segment exposed. The bladder flap was developed with Metzenbaum scissors and pushed away from the poorly-developed lower uterine segment. The lower uterine segment was then incised in a low transverse fashion and the cavity itself entered bluntly. The incision was extended bluntly. Fetal buttocks palpated within amniotic sac consistent with frank breech presentation and partially delivered thorugh hysterotomy en caul, eventually clear SROM. Pinard x2 used to deliver BL LE atraumatically. Towel used to gently rotate fetus and deliver until BL scapula visible. BL UE swept across fetal thorax. Using MSV maneuver, infant's head delivered from the incision without difficulty. The nose and mouth were bulb suctioned with the cord clamped and cut as well after one minute and milking. The infant was handed off to NICU. The placenta was then spontaneously expressed from the uterus and the uterus cleared of all clots and debris with moist  lap sponge. The uterine incision was then repaired in 2  layers the first layer was a running locked layer of 0-vicryl and the second an imbricating layer of the same suture. Boggy uterine note noted that would intermittently respond to massage. TXA and IM methergine administered. With improved hemostasis. The tubes and ovaries were inspected and the gutters cleared of all clots and debris. The uterine incision was inspected and found to be hemostatic after application of Arista. All instruments and sponges as well as the Alexis retractor were then removed from the abdomen.  The rectus muscles were then reapproximated with several interrupted mattress sutures of 2-0 Vicryl. The fascia was then closed with 0 Vicryl in a running fashion. Subcutaneous tissue was reapproximated with 3-0 plain in a running fashion. The skin was closed with a subcuticular stitch of 4-0 Vicryl on a Keith needle and then reinforced with Dermabond and Honeycomb dressing. At the conclusion of the procedure all instruments and sponge counts were correct. Patient was taken to the recovery room in good condition, baby doing well in NICU ? ?Continue MgSO4 for 24hrs postpartum. Will continue CBG monitoring, hold meds at this time.  ? ? ? ?

## 2021-12-28 NOTE — Plan of Care (Signed)
?  Problem: Education: °Goal: Knowledge of disease or condition will improve °Outcome: Completed/Met °Goal: Knowledge of the prescribed therapeutic regimen will improve °Outcome: Completed/Met °Goal: Individualized Educational Video(s) °Outcome: Completed/Met °  °Problem: Clinical Measurements: °Goal: Complications related to the disease process, condition or treatment will be avoided or minimized °Outcome: Completed/Met °  °

## 2021-12-28 NOTE — Anesthesia Procedure Notes (Addendum)
Spinal ? ?Patient location during procedure: OR ?Start time: 12/28/2021 2:26 PM ?End time: 12/28/2021 2:31 PM ?Reason for block: surgical anesthesia ?Staffing ?Performed: anesthesiologist  ?Anesthesiologist: Nolon Nations, MD ?Preanesthetic Checklist ?Completed: patient identified, IV checked, site marked, risks and benefits discussed, surgical consent, monitors and equipment checked, pre-op evaluation and timeout performed ?Spinal Block ?Patient position: sitting ?Prep: DuraPrep and site prepped and draped ?Patient monitoring: heart rate, continuous pulse ox and blood pressure ?Approach: midline ?Location: L3-4 ?Injection technique: single-shot ?Needle ?Needle type: Spinocan  ?Needle gauge: 25 G ?Needle length: 9 cm ?Additional Notes ?Expiration date of kit checked and confirmed. Patient tolerated procedure well, without complications. ? ? ? ?

## 2021-12-28 NOTE — Transfer of Care (Signed)
Immediate Anesthesia Transfer of Care Note ? ?Patient: Carol [name] ? ?Procedure(s) Performed: CESAREAN SECTION ? ?Patient Location: PACU ? ?Anesthesia Type:Spinal ? ?Level of Consciousness: awake, alert  and oriented ? ?Airway & Oxygen Therapy: Patient Spontanous Breathing ? ?Post-op Assessment: Report given to RN and Post -op Vital signs reviewed and stable ? ?Post vital signs: Reviewed and stable ? ?Last Vitals:  ?Vitals Value Taken Time  ?BP 122/82 12/28/21 1605  ?Temp    ?Pulse 76 12/28/21 1608  ?Resp 19 12/28/21 1608  ?SpO2 97 % 12/28/21 1608  ?Vitals shown include unvalidated device data. ? ?Last Pain:  ?Vitals:  ? 12/28/21 1200  ?TempSrc: Oral  ?PainSc:   ?   ? ?Patients Stated Pain Goal: 3 (12/28/21 0946) ? ?Complications: No notable events documented. ?

## 2021-12-29 LAB — COMPREHENSIVE METABOLIC PANEL
ALT: 358 U/L — ABNORMAL HIGH (ref 0–44)
AST: 238 U/L — ABNORMAL HIGH (ref 15–41)
Albumin: 2.5 g/dL — ABNORMAL LOW (ref 3.5–5.0)
Alkaline Phosphatase: 116 U/L (ref 38–126)
Anion gap: 8 (ref 5–15)
BUN: 9 mg/dL (ref 6–20)
CO2: 23 mmol/L (ref 22–32)
Calcium: 7.4 mg/dL — ABNORMAL LOW (ref 8.9–10.3)
Chloride: 102 mmol/L (ref 98–111)
Creatinine, Ser: 0.76 mg/dL (ref 0.44–1.00)
GFR, Estimated: >60 mL/min (ref >60)
Glucose, Bld: 130 mg/dL — ABNORMAL HIGH (ref 70–99)
Potassium: 4.2 mmol/L (ref 3.5–5.1)
Sodium: 133 mmol/L — ABNORMAL LOW (ref 135–145)
Total Bilirubin: 0.3 mg/dL (ref 0.3–1.2)
Total Protein: 5.3 g/dL — ABNORMAL LOW (ref 6.5–8.1)

## 2021-12-29 LAB — GLUCOSE, CAPILLARY
Glucose-Capillary: 120 mg/dL — ABNORMAL HIGH (ref 70–99)
Glucose-Capillary: 97 mg/dL (ref 70–99)

## 2021-12-29 LAB — CBC
HCT: 29.4 % — ABNORMAL LOW (ref 36.0–46.0)
Hemoglobin: 10.1 g/dL — ABNORMAL LOW (ref 12.0–15.0)
MCH: 26.6 pg (ref 26.0–34.0)
MCHC: 34.4 g/dL (ref 30.0–36.0)
MCV: 77.4 fL — ABNORMAL LOW (ref 80.0–100.0)
Platelets: 158 10*3/uL (ref 150–400)
RBC: 3.8 MIL/uL — ABNORMAL LOW (ref 3.87–5.11)
RDW: 14.4 % (ref 11.5–15.5)
WBC: 20.7 10*3/uL — ABNORMAL HIGH (ref 4.0–10.5)
nRBC: 0.2 % (ref 0.0–0.2)

## 2021-12-29 NOTE — Anesthesia Postprocedure Evaluation (Signed)
Anesthesia Post Note ? ?Patient: Carol [name] ? ?Procedure(s) Performed: CESAREAN SECTION ? ?  ? ?Patient location during evaluation: PACU ?Anesthesia Type: Spinal ?Level of consciousness: awake and alert ?Pain management: pain level controlled ?Vital Signs Assessment: post-procedure vital signs reviewed and stable ?Respiratory status: spontaneous breathing and respiratory function stable ?Cardiovascular status: blood pressure returned to baseline and stable ?Postop Assessment: spinal receding ?Anesthetic complications: no ? ? ?No notable events documented. ? ?Last Vitals:  ?Vitals:  ? 12/29/21 0127 12/29/21 0351  ?BP: 125/81 129/79  ?Pulse: 72 76  ?Resp: 18 16  ?Temp: 36.7 ?C 36.8 ?C  ?SpO2:  97%  ?  ?Last Pain:  ?Vitals:  ? 12/29/21 0623  ?TempSrc:   ?PainSc: 0-No pain  ? ? ?  ?  ?  ?  ?  ?  ? ?Lewie Loron ? ? ? ? ?

## 2021-12-29 NOTE — Lactation Note (Signed)
This note was copied from a baby's chart. ? ?NICU Lactation Consultation Note ? ?Patient Name: Carol [name] ?YIRSW'N Date: 12/29/2021 ?Age:36 years ? ? ?Subjective ?Reason for consult: Initial assessment; NICU baby ?Mother plans to bottle feed but is willing to pump for her preterm infant. LC set up pump and assisted with initial pumping. Mother denies hx of breast surgery / trauma. ? ?Mother requests stork pump. RN to place order. ? ?Mother plans to resume medication for MS in 4 weeks. We reviewed medication safety during lactation. ? ?Objective ?Infant data: ?Mother's Current Feeding Choice: Breast Milk and Donor Milk ? ?  ?Maternal data: ?[id number]  ?C-Section, Low Transverse ?Does the patient have breastfeeding experience prior to this delivery?: No ? ?Pumping frequency: Encouraged to pump q3 ? ?Risk factor for low milk supply:: HELLP syndrome, Pre-E/Mag, AMA ?GDM ? ?  ?Assessment ?Maternal: ?Normal breast development and breast symmetry. ?Multiple risk factors may impact milk production. ? ?Intervention/Plan ?Interventions: Education; "The NICU and Your Baby" book; Infant Driven Feeding Algorithm education ? ?Tools: Pump ?Pump Education: Setup, frequency, and cleaning; Milk Storage ? ?Plan: ?Consult Status: Follow-up (verify receipt of stork pump) ? ?NICU Follow-up type: New admission follow up; Maternal D/C visit; Verify onset of copious milk; Verify absence of engorgement ? ?Mother to pump q3 and bring EBM to NICU. ?Mother to notify infant's medical team when she resumes MS medications if she is still providing milk. Compatibility with bf'ing should be verified.  ? ?Elder Negus ?12/29/2021, 9:35 AM ?

## 2021-12-29 NOTE — Progress Notes (Signed)
Subjective: ?Postpartum Day 1: Cesarean Delivery ?Patient reports tolerating PO and + flatus.  Lochia mild. Pain well controlled. Ambulating with no difficulty. Denies HA/SOB/CP. Some fatigue attributed to MgSo4 infusion but overall tolerating well.  ? ?Objective: ?Vital signs in last 24 hours: ?Temp:  [97.1 ?F (36.2 ?C)-98.3 ?F (36.8 ?C)] 98.3 ?F (36.8 ?C) (03/11 1205) ?Pulse Rate:  [72-84] 72 (03/11 1205) ?Resp:  [7-18] 18 (03/11 1205) ?BP: (112-141)/(68-92) 120/70 (03/11 1205) ?SpO2:  [94 %-100 %] 99 % (03/11 1205) ? ?Physical Exam:  ?General: alert, cooperative, and no distress ?Lochia: appropriate ?Uterine Fundus: firm ?Incision: no significant drainage ?DVT Evaluation: No evidence of DVT seen on physical exam. ? ?Recent Labs  ?  12/28/21 ?1194 12/29/21 ?0410  ?HGB 11.7* 10.1*  ?HCT 33.1* 29.4*  ? ? ?Assessment/Plan: ?Status post Cesarean section. Doing well postoperatively.  ?Discussed PreE vs HELLP ?Continue current care.- plan to complete MgSAO4 at 1630pm  ?Monitor BP to see if stays controlled. If elevated, will treat per protocol ?Baby doing well in NICU per pt  ?Cathrine Muster ?12/29/2021, 12:29 PM ? ? ?

## 2021-12-29 NOTE — Progress Notes (Signed)
Patient voided 150 cc of urine at 1630 post catheter removal at 1000 am. Bladder scan at 1653 displayed 0 cc urine retained. ?

## 2021-12-29 NOTE — Plan of Care (Signed)
?  Problem: Coping: ?Goal: Level of anxiety will decrease ?Outcome: Completed/Met ?  ?Problem: Education: ?Goal: Knowledge of disease or condition will improve ?Outcome: Completed/Met ?Goal: Knowledge of the prescribed therapeutic regimen will improve ?Outcome: Completed/Met ?Goal: Individualized Educational Video(s) ?Outcome: Completed/Met ?  ?Problem: Clinical Measurements: ?Goal: Complications related to the disease process, condition or treatment will be avoided or minimized ?Outcome: Completed/Met ?  ?Problem: Coping: ?Goal: Ability to identify and utilize available resources and services will improve ?Outcome: Completed/Met ?  ?Problem: Life Cycle: ?Goal: Chance of risk for complications during the postpartum period will decrease ?Outcome: Completed/Met ?  ?Problem: Role Relationship: ?Goal: Ability to demonstrate positive interaction with newborn will improve ?Outcome: Completed/Met ?  ?

## 2021-12-30 LAB — CULTURE, BETA STREP (GROUP B ONLY)

## 2021-12-30 MED ORDER — TRAMADOL HCL 50 MG PO TABS
50.0000 mg | ORAL_TABLET | Freq: Four times a day (QID) | ORAL | 0 refills | Status: AC | PRN
Start: 2021-12-30 — End: 2022-01-06

## 2021-12-30 MED ORDER — IBUPROFEN 600 MG PO TABS
600.0000 mg | ORAL_TABLET | Freq: Four times a day (QID) | ORAL | 1 refills | Status: DC | PRN
Start: 2021-12-30 — End: 2021-12-30

## 2021-12-30 MED ORDER — OXYCODONE-ACETAMINOPHEN 5-325 MG PO TABS: 1.0000 | ORAL_TABLET | Freq: Four times a day (QID) | ORAL | 0 refills | Status: AC | PRN

## 2021-12-30 NOTE — Discharge Summary (Signed)
? ?  Postpartum Discharge Summary ? ?Date of Service updated  ? ?   ?Patient Name: Carol [name] ?DOB: Nov 15, 1985 ?MRN: [id number] ? ?Date of admission: 12/27/2021 ?Delivery date:12/28/2021  ?Delivering provider: Carlisle Cater  ?Date of discharge: 12/30/2021 ? ?Admitting diagnosis: Preeclampsia, third trimester [O14.93] ?Intrauterine pregnancy: [redacted]w[redacted]d     ?Secondary diagnosis:  Principal Problem: ?  Preeclampsia, third trimester ?Breech presentation  ? ?Additional problems: none    ?Discharge diagnosis: Term Pregnancy Delivered and HELLP                                               ?Post partum procedures: MgSO4 ?Augmentation: N/A ?Complications: None ? ?Hospital course: Sceduled C/S   36 y.o. yo G2P0111 at [redacted]w[redacted]d was admitted to the hospital 12/27/2021 for scheduled cesarean section with the following indication:Malpresentation.Delivery details are as follows:  ?Membrane Rupture Time/Date:  ,12/28/2021   ?Delivery Method:C-Section, Low Transverse  ?Details of operation can be found in separate operative note.  Patient had an uncomplicated postpartum course.  She is ambulating, tolerating a regular diet, passing flatus, and urinating well. Patient is discharged home in stable condition on  12/30/21 ?       ?Newborn Data: ?Birth date:12/28/2021  ?Birth time:3:04 PM  ?Gender:Female  ?Living status:Living  ?Apgars:6 ,9  ?Weight:1830 g    ? ?Magnesium Sulfate received: Yes: Neuroprotection ?BMZ received: Yes ?Rhophylac:N/A ? ?Physical exam  ?Vitals:  ? 12/29/21 1946 12/30/21 0000 12/30/21 0510 12/30/21 0816  ?BP: 123/62 (!) 118/56 127/66 129/72  ?Pulse: 78 95 84 77  ?Resp: 16 16 18 18   ?Temp: 98.3 ?F (36.8 ?C) 98.2 ?F (36.8 ?C) 98.5 ?F (36.9 ?C) 98.2 ?F (36.8 ?C)  ?TempSrc: Oral Oral Oral Oral  ?SpO2: 100% 100% 100% 98%  ?Weight:      ?Height:      ? ?General: alert, cooperative, and no distress ?Lochia: appropriate ?Uterine Fundus: firm ?Incision: Dressing is clean, dry, and intact ?DVT Evaluation: No evidence of DVT seen on  physical exam. ?Labs: ?Lab Results  ?Component Value Date  ? WBC 20.7 (H) 12/29/2021  ? HGB 10.1 (L) 12/29/2021  ? HCT 29.4 (L) 12/29/2021  ? MCV 77.4 (L) 12/29/2021  ? PLT 158 12/29/2021  ? ?CMP Latest Ref Rng & Units 12/29/2021  ?Glucose 70 - 99 mg/dL 02/28/2022)  ?BUN 6 - 20 mg/dL 9  ?Creatinine 0.44 - 1.00 mg/dL 786(V  ?Sodium 135 - 145 mmol/L 133(L)  ?Potassium 3.5 - 5.1 mmol/L 4.2  ?Chloride 98 - 111 mmol/L 102  ?CO2 22 - 32 mmol/L 23  ?Calcium 8.9 - 10.3 mg/dL 7.4(L)  ?Total Protein 6.5 - 8.1 g/dL 5.3(L)  ?Total Bilirubin 0.3 - 1.2 mg/dL 0.3  ?Alkaline Phos 38 - 126 U/L 116  ?AST 15 - 41 U/L 238(H)  ?ALT 0 - 44 U/L 358(H)  ? ?Edinburgh Score: ?Edinburgh Postnatal Depression Scale Screening Tool 12/29/2021  ?I have been able to laugh and see the funny side of things. 0  ?I have looked forward with enjoyment to things. 0  ?I have blamed myself unnecessarily when things went wrong. 0  ?I have been anxious or worried for no good reason. 0  ?I have felt scared or panicky for no good reason. 0  ?Things have been getting on top of me. 0  ?I have been so unhappy that I have had difficulty sleeping. 0  ?  I have felt sad or miserable. 0  ?I have been so unhappy that I have been crying. 0  ?The thought of harming myself has occurred to me. 0  ?Edinburgh Postnatal Depression Scale Total 0  ? ? ? ? ?After visit meds:  ?Allergies as of 12/30/2021   ?No Known Allergies ?  ? ?  ?Medication List  ?  ? ?TAKE these medications   ? ?oxyCODONE-acetaminophen 5-325 MG tablet ?Commonly known as: Percocet ?Take 1 tablet by mouth every 6 (six) hours as needed for up to 7 days for severe pain. ?  ?Prenatal Vitamin 27-0.8 MG Tabs ?Take 1 tablet by mouth daily. ?  ?traMADol 50 MG tablet ?Commonly known as: Ultram ?Take 1 tablet (50 mg total) by mouth every 6 (six) hours as needed for up to 7 days. ?  ? ?  ? ?  ?  ? ? ?  ?Durable Medical Equipment  ?(From admission, onward)  ?  ? ? ?  ? ?  Start     Ordered  ? 12/29/21 0913  For home use only DME  double electric breast pump  Once       ?Comments: ICD-10 239.1  ? 12/29/21 0913  ? ?  ?  ? ?  ? ? ?  ?Discharge Care Instructions  ?(From admission, onward)  ?  ? ? ?  ? ?  Start     Ordered  ? 12/30/21 0000  Discharge wound care:       ?Comments: May shower, pat dry, keep clean  ? 12/30/21 1133  ? ?  ?  ? ?  ? ? ? ?Discharge home in stable condition ?Infant Feeding: Breast ?Infant Disposition:NICU ?Discharge instruction: per After Visit Summary and Postpartum booklet. ?Activity: Advance as tolerated. Pelvic rest for 6 weeks.  ?Diet: low salt diet ?Anticipated Birth Control: Unsure ?Postpartum Appointment:6 weeks ?Additional Postpartum F/U: Incision check 1 week and BP check 1 week ?Future Appointments: ?Future Appointments  ?Date Time Provider Department Center  ?03/07/2022 10:30 AM Levert Feinstein, MD [facility] None  ? ?Follow up Visit: ? Follow-up Information   ? ? Ob/Gyn, Nestor Ramp. Schedule an appointment as soon as possible for a visit.   ?Why: 1 week for BP  and incision check and 6 weeks for postpartum visit ?Contact information: ?[address] ?[address] ?[place] [address] ?[phone] ? ? ?  ?  ? ?  ?  ? ?  ? ? ? ?  ? ?12/30/2021 ?Cathrine Muster, DO ? ? ?

## 2021-12-30 NOTE — Progress Notes (Signed)
Subjective: ?Postpartum Day 2: Cesarean Delivery ?Patient reports tolerating PO, + flatus, and no problems voiding.  Lochia mild. Pain well controlled. Denies HA/SOB/CP. Feels better/ less fatigued off MgSO4. Pumping well.  ?She would like discharge to home today if possible  ? ?Objective: ?Vital signs in last 24 hours: ?Temp:  [98.2 ?F (36.8 ?C)-98.5 ?F (36.9 ?C)] 98.2 ?F (36.8 ?C) (03/12 3664) ?Pulse Rate:  [72-95] 77 (03/12 0816) ?Resp:  [16-18] 18 (03/12 0816) ?BP: (116-129)/(56-72) 129/72 (03/12 4034) ?SpO2:  [98 %-100 %] 98 % (03/12 0816) ? ?Physical Exam:  ?General: alert, cooperative, and no distress ?Lochia: appropriate ?Uterine Fundus: firm ?Incision: no significant drainage ?DVT Evaluation: No evidence of DVT seen on physical exam. ? ?Recent Labs  ?  12/28/21 ?7425 12/29/21 ?0410  ?HGB 11.7* 10.1*  ?HCT 33.1* 29.4*  ? ? ?Assessment/Plan: ?Status post Cesarean section. Doing well postoperatively.  ?Discharge home with standard precautions ?BP and incision check in 1 week and postpartum visit in 6 weeks  ?PreE precautions reviewed ?Cathrine Muster ?12/30/2021, 11:22 AM ? ? ?

## 2021-12-30 NOTE — Discharge Instructions (Signed)
Call office with any concerns [phone] 

## 2021-12-30 NOTE — Lactation Note (Signed)
This note was copied from a baby's chart. ? ?NICU Lactation Consultation Note ? ?Patient Name: Carol [name] ?EHUDJ'S Date: 12/30/2021 ?Age:36 hours ? ? ?Subjective ?Reason for consult: Follow-up assessment ?Mother has not pumped since yesterday. She plans to resume pumping today. We reviewed importance of frequent pumping.  ? ?Mother received stork pump. ? ?Objective ?Infant data: ?Mother's Current Feeding Choice: Breast Milk and Donor Milk ? ?  ?Maternal data: ?[id number]  ?C-Section, Low Transverse ?Does the patient have breastfeeding experience prior to this delivery?: No ? ?Pumping frequency: none today ? ?Risk factor for low milk supply:: infrequent pumping ? ? ?Pump: Stork Pump ? ? ?Intervention/Plan ?Interventions: Education ? ?Tools: Pump ?Pump Education: Setup, frequency, and cleaning; Milk Storage ? ?Plan: ?Consult Status: Follow-up ? ?NICU Follow-up type: Maternal D/C visit; Verify onset of copious milk; Verify absence of engorgement ? ?Mother to pump q3 and bring any EBM to NICU. ? ?Elder Negus ?12/30/2021, 10:52 AM ?

## 2021-12-30 NOTE — Progress Notes (Signed)
Patient ID: Carol [name], female   DOB: 1986/02/03, 36 y.o.   MRN: [id number] ?Chart check  ? ?BP stable since MgSO4 stopped at 1630 on 12/29/2021 ? ?Confirmed with nurse pt voided at 8pm and another at 2200  ? ? ?Continue routine pp care  ? ?

## 2022-01-01 ENCOUNTER — Ambulatory Visit: Payer: Self-pay

## 2022-01-01 LAB — SURGICAL PATHOLOGY

## 2022-01-01 NOTE — Lactation Note (Signed)
This note was copied from a baby's chart. ? ?NICU Lactation Consultation Note ? ?Patient Name: Carol [name] ?XNATF'T Date: 01/01/2022 ?Age:36 years ? ? ?Subjective ?Reason for consult: Follow-up assessment ?Mother denies +breast changes on day 4. She has not pumped in the past 24 hours because of concern of milk safety while using pain medication. We reviewed compatibility of her MD ordered medications. She is aware to f/u with medical team with any concerns.  ? ?Objective ?Infant data: ?Mother's Current Feeding Choice: Breast Milk and Donor Milk ? ?Infant feeding assessment ?Scale for Readiness: 2 ? ?  ?Maternal data: ?[id number]  ?C-Section, Low Transverse ? ?Pumping frequency: none in past 24 hours ? ?Risk factor for low milk supply:: infrequent pumping ? ? ?Pump: Stork Pump ? ?Assessment ?Maternal: ? ?Intervention/Plan ?Interventions: Education ? ?Plan: ?Consult Status: Follow-up ? ?NICU Follow-up type: Verify onset of copious milk ? ?Mother to pump q3 according to her bf/pumping goals.  ? ? ?Elder Negus ?01/01/2022, 5:27 PM ?

## 2022-01-02 ENCOUNTER — Ambulatory Visit: Payer: Self-pay

## 2022-01-02 NOTE — Lactation Note (Signed)
This note was copied from a baby's chart. ?Lactation Consultation Note ? ?Patient Name: Carol [name] ?YJEHU'D Date: 01/02/2022 ?Reason for consult: Preterm <34wks;NICU baby;Primapara;1st time breastfeeding;Infant < 6lbs;Maternal endocrine disorder;Other (Comment);Infant weight loss (AMA, HELLP syndrome) ?Age:36 days ? ?P1 with concerns about transition to formula once she restarts her Gilenya.  Mom has MS and will see her doctor on April 5th to determine when she will restart her Gilenya.  Mom is aware that breast milk is not recommended once she restarts Gilenya as it is a L5 drug.  Mom would like to transition when appropriate to formula.   ? ?Mom is currently pumping about 7 times in 24 hours producing drops.  She stated that this is the most she has produced.  Discussed power pumping in the morning to help increase her supply as well as pumping at least 8 times in 24 hours.  Educated mom on her options to continue pumping or to transition to formula.   ? ?All questions and concerns addressed prior to leaving the room.  Mom is aware to call LC if she needs assistance.   ? ?Feeding plan: ? Pump every 3 hours for a goal of 8 pumping sessions in 24 hours.  ?Power pump in the morning  ?Let team know when mom will restart her Gilenya  ? ?Feeding ?Mother's Current Feeding Choice: Breast Milk and Donor Milk ? ? ?Lactation Tools Discussed/Used ?Tools: Pump ?Breast pump type: Double-Electric Breast Pump ?Pump Education: Milk Storage ?Reason for Pumping: Infant separation, NICU ?Pumping frequency: 7 times in 24 hours ?Pumped volume:  (drops per mom) ? ?Interventions ?Interventions: Breast feeding basics reviewed;Education;DEBP ? ?Discharge ?Pump: DEBP;Stork Pump ? ?Consult Status ?Consult Status: Follow-up ?Date: 01/02/22 ?Follow-up type: In-patient ? ? ? ?Danne Harbor ?01/02/2022, 4:26 PM ? ? ? ?

## 2022-01-03 ENCOUNTER — Ambulatory Visit: Payer: Self-pay

## 2022-01-03 NOTE — Lactation Note (Signed)
This note was copied from a baby's chart. ? ?NICU Lactation Consultation Note ? ?Patient Name: Carol [name] ?MGQQP'Y Date: 01/03/2022 ?Age:36 days ? ? ?Subjective ?Reason for consult: Follow-up assessment; Mother's request; NICU baby; Preterm <34wks ? ?Lactation followed up with Ms. Luft upon request. She states that she's noticing that her Irena Cords Pump is less effective than her Symphony. She had her pump with her, and we checked the settings, and I observed her use both pumps. I noted differences in suction cycle, and we noted more milk express with the Symphony. ? ?We discussed options to obtain a Symphony for short term use. Ms. Pint will call [employer] [facility] to see if she could possibly check out a Symphony pump for the next two weeks. I also made her aware that the gift shop provides rental pumps. ? ?We discussed how maternal factors may affect timing of onset of lactogenesis II. I provided reassurance that her milk, in any amount, is of benefit to her daughter. Lactation follow up is recommended.  ? ?Objective ?Infant data: ?Mother's Current Feeding Choice: Breast Milk and Donor Milk ? ?Infant feeding assessment ?Scale for Readiness: 3 ? ?Maternal data: ?[id number]  ?C-Section, Low Transverse ? ?Current breast feeding challenges:: NICU ? ?Pumping frequency: q3 hours with a.m. powerpump ?Pumped volume: 4 mL ? ? ?Pump: Stork Pump ? ?Assessment ? ?Maternal: ?Milk volume: Low (mom reported positive breast changes during pregnancy but her milk has not coming in on day 5) ? ?Intervention/Plan ?Interventions: Education; DEBP ? ?Tools: Pump ?Pump Education: Setup, frequency, and cleaning ? ?Plan: ?Consult Status: Follow-up ? ?NICU Follow-up type: New admission follow up; Verify onset of copious milk; Verify absence of engorgement ? ? ? ?Walker Shadow ?01/03/2022, 6:12 PM ?

## 2022-01-08 ENCOUNTER — Telehealth (HOSPITAL_COMMUNITY): Payer: Self-pay

## 2022-01-08 NOTE — Telephone Encounter (Signed)
No answer. Left message to return nurse call. ? Jerald Kief ?01/08/2022,1457 ?

## 2022-01-08 NOTE — Telephone Encounter (Signed)
Patient returning previous nurse call. ? ?"I'm good, healing well. I'm not having much pain." Patient declines questions or concerns about her healing.  ? ?"Baby is in the NICU. She is progressing well." ? ?EPDS score is 6. ? ?Marcelino Duster Jhovani Griswold,RN3,MSN,RNC-MNN ?01/08/2022,1505 ?

## 2022-01-10 ENCOUNTER — Ambulatory Visit: Payer: Self-pay

## 2022-01-10 NOTE — Lactation Note (Signed)
This note was copied from a Carol's chart. ? ?NICU Lactation Consultation Note ? ?Patient Name: Carol [name] ?YIAXK'P Date: 01/10/2022 ?Age:36 days ? ? ?Subjective ?Reason for consult: Follow-up assessment; NICU Carol; Preterm <34wks ? ?Lactation followed up with Carol [name]. There have been several positive developments since we met last week. Her milk volume has increased significantly from 4 mls/pumping session to approximately 20-30 mls/session. She attributes this to pumping more consistently (q3 hours), better hydration, and a better pump. She obtained a Symphony pump from [facility] since we last spoke, and she states that it has been better than her Stork pump. ? ?Carol [name]'s plan is to exclusively pump and bottle feed. She is delighted that Carol [name] is obtaining more of her expressed milk. Carol [name] plans to pump until at least April 5, when she will follow up with her neurologist. She will have to stop pumping once she resumes her MS medication, but until then she will continue to pump. ? ?Lactation follow up recommended.I encouraged her to call for assistance, as needed.  ? ?Objective ?Infant data: ?Mother's Current Feeding Choice: Breast Milk and Formula ? ?Infant feeding assessment ?Scale for Readiness: 3 ? ?Maternal data: ?[id number]  ?C-Section, Low Transverse ?Current breast feeding challenges:: NICU ? ?Pumping frequency: q3 hours ?Pumped volume: 25 mL (almost 1 ounce,combined) ? ?Risk factor for low milk supply:: history of MS; other factors that may contribute to IMS ? ? ?Pump: Stork Pump [facility] Symphony Pump) ? ?Assessment ? ?Intervention/Plan ?Interventions: Education ? ?Pump Education: Setup, frequency, and cleaning ? ?Plan: ?Consult Status: Follow-up ? ?NICU Follow-up type: Weekly NICU follow up ? ? ? ?Carol [name] ?01/10/2022, 4:52 PM ?

## 2022-01-17 ENCOUNTER — Ambulatory Visit: Payer: Self-pay

## 2022-01-17 NOTE — Lactation Note (Signed)
This note was copied from a baby's chart. ? ?NICU Lactation Consultation Note ? ?Patient Name: Carol [name] ?UXNAT'F Date: 01/17/2022 ?Age:36 wk.o. ? ? ?Subjective ?Reason for consult: Follow-up assessment; NICU baby; Preterm <34wks ? ?Lactation followed up with Carol [name]. We discussed baby's progress over the last week. She stated that baby Ah'jreama took a bottle well today with the SLP. Carol [name] is pumping and supplementing with her milk in addition to formula. She states that she is not pumping as consistently, and this has resulted in some decrease in her volume.  ? ?Carol [name] will follow up with her neurologist on April 4 and may begin medication for MS until the following week. I indicated that lactation would be available as a resource when the time comes to stop pumping.  ? ?Objective ?Infant data: ?Mother's Current Feeding Choice: Breast Milk and Formula ? ?Infant feeding assessment ?Scale for Readiness: 2 ?Scale for Quality: 2 ? ?Maternal data: ?[id number]  ?C-Section, Low Transverse ? ? ?Pump: Stork Pump [facility] Symphony Pump) ? ?Intervention/Plan ?Interventions: Breast feeding basics reviewed; Education ? ?Plan: ?Consult Status: Follow-up ? ?NICU Follow-up type: Weekly NICU follow up ? ? ? ?Walker Shadow ?01/17/2022, 1:22 PM ?

## 2022-01-19 ENCOUNTER — Ambulatory Visit: Payer: Self-pay

## 2022-01-19 NOTE — Lactation Note (Signed)
This note was copied from a baby's chart. ? ?NICU Lactation Consultation Note ? ?Patient Name: Carol [name] ?MVEHM'C Date: 01/19/2022 ?Age:36 wk.o. ? ? ?Subjective ?Reason for consult: Follow-up assessment; Other (Comment) (infant d/c) ?Infant is likely to d/c tomorrow. Mother continues to pump occasionally but has questions about drying up milk. She plans to resume medication next week that is incompatible with bf'ing. We reviewed strategy.  ? ?Objective ?Infant data: ?Mother's Current Feeding Choice: Breast Milk and Formula ? ?Infant feeding assessment ?Scale for Readiness: 2 ?Scale for Quality: 2 ? ? ?  ?Maternal data: ?[id number]  ?C-Section, Low Transverse ?Pumping frequency: 2-3 x day ?Pumped volume: 20 mL ? ? ?Pump: Stork Pump [facility] Symphony Pump) ? ?Assessment ?Maternal: ?Milk volume: Low ? ? ?Intervention/Plan ?Interventions: Education ? ?No data recorded ?Plan: ?Consult Status: Complete ?Mother to decrease pumping frequency and duration over the next week or two while milk supply dries.  ? ?Elder Negus ?01/19/2022, 5:09 PM ?

## 2022-01-23 ENCOUNTER — Ambulatory Visit: Payer: No Typology Code available for payment source | Admitting: Neurology

## 2022-02-05 ENCOUNTER — Telehealth: Payer: Self-pay

## 2022-02-05 ENCOUNTER — Encounter: Payer: Self-pay | Admitting: Neurology

## 2022-02-05 ENCOUNTER — Telehealth: Payer: Self-pay | Admitting: Neurology

## 2022-02-05 ENCOUNTER — Ambulatory Visit (INDEPENDENT_AMBULATORY_CARE_PROVIDER_SITE_OTHER): Payer: No Typology Code available for payment source | Admitting: Neurology

## 2022-02-05 VITALS — BP 119/79 | HR 78 | Ht 71.0 in | Wt 229.0 lb

## 2022-02-05 DIAGNOSIS — G35 Multiple sclerosis: Secondary | ICD-10-CM

## 2022-02-05 NOTE — Telephone Encounter (Signed)
Received mayzent start form from Dr. Terrace Arabia. Faxed to [employer]. Received a receipt of confirmation. ? ?[employer] will create a case on CMM and then I will be able to complete the PA. ?

## 2022-02-05 NOTE — Patient Instructions (Signed)
Mayzent ?

## 2022-02-05 NOTE — Progress Notes (Addendum)
PRIMARY NEUROLOGIST: Dr.Hurley Blevins  Carol [name] is a 36 year old female with a history of relapse remitting multiple sclerosis.    She was enrolled in [place], randomized to Gilenya, since March 5th 2014.    In March 2013, she developed new-onset right eye pain and blurred vision. She was initially diagnosed with corneal abrasion. She went to the emergency room twice for this. She then followed up with ophthalmology, who ordered MRI of the brain and orbits. This showed acute right optic neuritis and 7-8 chronic demyelinating plaques. She was diagnosed with probable multiple sclerosis    Around beginning of December 2013, patient developed new symptoms of numbness in her bilateral fingertips and bilateral toes, lasted for 2 weeks.   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 Patient also has intermittent muscle spasms and balance difficulty. No family history of MS.   MRI brain showed approximately six lesions in the brain which are consistent with multiple sclerosis. there is abnormality in the right optic nerve consistent with  multiple sclerosis, see below report.  No enhancing lesions are identified.   MRI ORBITS in April 2013: Mild swelling of hyperintensity in the right optic nerve is present.    Showed abnormal  enhancement with slight stranding in the surrounding orbital fat suggesting acute inflammation.  Enhancement is most prominent within the optic canal on the right.  Optic chiasm is normal. The left optic nerve is normal.     She remains on Gilenya and is tolerating it well. She denies any new numbness or weakness. She continues to report numbness in the hands and lower extremity. She states occasionally she'll have burning and tingling pain in the right leg. The symptoms are intermittent and has occurred before. She denies any changes with her gait or balance. Denies any changes with her  vision. No change in her mood or behavior. Her last MRI of the brain was in 2017. At that time he did show 2 new lesions however the patient had been off her Gilenya for 5 months. The patient also sees a hematologist for anemia. She denies any new neurological symptoms. She returns today for an evaluation.   Update 03/16/2019 YY: There was no significant worsening of her MS symptoms, she continue have intermittent bilateral upper and lower extremity paresthesia, taking gabapentin 400 mg 3 times a day,   She denies visual loss, no significant gait abnormality, has been taking Gilenya since 2014, tolerating it well,   I personally reviewed MRI brain in Oct 2018: Multiple round and ovoid periventricular and subcortical foci of chronic demyelinating plaque, no change compared to scan in November 2019   She presented to emergency room on January 07, 2019 for fever, 104, whole body achy pain, cough, chest x-ray showed low lung volumes with bibasilar atelectasis, infiltrates, patient symptom last for 1 week, now improved, per patient, COVID-19 RNA testing was negative, but I do not find the report   Laboratory evaluations showed hemoglobin of 12.6, MCV of 75.4, platelet was 132, BMP showed potassium of 3.3, glucose was elevated 115,   UPDATE June 04 2021: She stopped Gilenya about a week ago, reported home testing was positive twice for pregnancy, it seems to be the similar story in May 2022, hCG beta chain was strongly positive then, pregnancy was terminated   She denies any new flareups of MS symptoms, overall doing well, working from home  Update December 05, 2021 SS: Is  pregnant [redacted] weeks 5 days, due date is February 15, 2022. Having a girl. Pregnancy is going well so far, no issues.  On prenatal vitamin.  Denies any MS symptoms.  At times, may have tingling/numbness to her hands intermittently.  Does not plan to breast-feed.  Remains off Gilenya.  UPDATE February 05 2022: She stop Gilenya in August 2022,  had a scheduled C-section on March 10th 2023, due to breech presentation, preeclampsia, with normal healthy baby girl  She denied new neurological symptoms, has intermittent bilateral hands paresthesia, no gait abnormality, She was very happy with the result of Gilenya, wants to go back on it, no longer breast-feeding  REVIEW OF SYSTEMS: Out of a complete 14 system review of symptoms, the patient complains only of the following symptoms, and all other reviewed systems are negative.  See HPI  ALLERGIES: No Known Allergies  HOME MEDICATIONS: Outpatient Medications Prior to Visit  Medication Sig Dispense Refill   Prenatal Vit-Fe Fumarate-FA (PRENATAL VITAMIN) 27-0.8 MG TABS Take 1 tablet by mouth daily. 30 tablet 12   Facility-Administered Medications Prior to Visit  Medication Dose Route Frequency Provider Last Rate Last Admin   gadopentetate dimeglumine (MAGNEVIST) injection 20 mL  20 mL Intravenous Once PRN Butch Penny, NP        PAST MEDICAL HISTORY: Past Medical History:  Diagnosis Date   Anemia    Low back pain 01/20/2013   Multiple sclerosis (HCC) 01/12/2013   Trichomoniasis     PAST SURGICAL HISTORY: Past Surgical History:  Procedure Laterality Date   CESAREAN SECTION N/A 12/28/2021   Procedure: CESAREAN SECTION;  Surgeon: Carlisle Cater, MD;  Location: [facility] ORS;  Service: Obstetrics;  Laterality: N/A;   LAPAROSCOPY Right 02/28/2021   Procedure: LAPAROSCOPY DIAGNOSTIC;  Surgeon: Charlett Nose, MD;  Location: [facility] OR;  Service: Gynecology;  Laterality: Right;    FAMILY HISTORY: Family History  Problem Relation Age of Onset   Healthy Mother    Hypertension Maternal Grandmother    Diabetes Maternal Grandmother    Hypertension Paternal Grandmother     SOCIAL HISTORY: Social History   Socioeconomic History   Marital status: Married    Spouse name: Not on file   Number of children: 0   Years of education: 12   Highest education level: Not on file   Occupational History    Comment: [employer]  Tobacco Use   Smoking status: Never   Smokeless tobacco: Never  Vaping Use   Vaping Use: Never used  Substance and Sexual Activity   Alcohol use: No   Drug use: Never   Sexual activity: Yes    Partners: Male    Birth control/protection: None  Other Topics Concern   Not on file  Social History Narrative   Patient is working at [employer] care Full time., lives with her husband, no children.   Patient is right handed.   Patient has a high school education and some college.   Patient does not drink caffeine.   Social Determinants of Corporate investment banker Strain: Not on file  Food Insecurity: No Food Insecurity   Worried About Programme researcher, broadcasting/film/video in the Last Year: Never true   Ran Out of Food in the Last Year: Never true  Transportation Needs: Not on file  Physical Activity: Not on file  Stress: Not on file  Social Connections: Not on file  Intimate Partner Violence: Not on file   PHYSICAL EXAM  Vitals:  02/05/22 1104  BP: 119/79  Pulse: 78  Weight: 229 lb (103.9 kg)  Height:  (1.803 m)   Body mass index is 31.94 kg/m.  Generalized: Well developed, in no acute distress    PHYSICAL EXAMNIATION:  Gen: NAD, conversant, well nourised, well groomed                     Cardiovascular: Regular rate rhythm, no peripheral edema, warm, nontender. Eyes: Conjunctivae clear without exudates or hemorrhage Neck: Supple, no carotid bruits. Pulmonary: Clear to auscultation bilaterally   NEUROLOGICAL EXAM:  MENTAL STATUS: Speech/cognition: Awake, alert, oriented to history taking and casual conversation   CRANIAL NERVES: CN II: Visual fields are full to confrontation.  Pupils are round equal and briskly reactive to light. CN III, IV, VI: extraocular movement are normal. No ptosis. CN V: Facial sensation is intact to pinprick in all 3 divisions bilaterally. Corneal responses are intact.  CN VII: Face is  symmetric with normal eye closure and smile. CN VIII: Hearing is normal to casual conversation CN IX, X: Palate elevates symmetrically. Phonation is normal. CN XI: Head turning and shoulder shrug are intact CN XII: Tongue is midline with normal movements and no atrophy.  MOTOR: There is no pronator drift of out-stretched arms. Muscle bulk and tone are normal. Muscle strength is normal.  REFLEXES: Reflexes are 2+ and symmetric at the biceps, triceps, knees, and ankles. Plantar responses are flexor.  SENSORY: Intact to light touch, pinprick, positional and vibratory sensation are intact in fingers and toes.  COORDINATION: Rapid alternating movements and fine finger movements are intact. There is no dysmetria on finger-to-nose and heel-knee-shin.    GAIT/STANCE: Posture is normal. Gait is steady with   DIAGNOSTIC DATA (LABS, IMAGING, TESTING) - I reviewed patient records, labs, notes, testing and imaging myself where available.  Lab Results  Component Value Date   WBC 20.7 (H) 12/29/2021   HGB 10.1 (L) 12/29/2021   HCT 29.4 (L) 12/29/2021   MCV 77.4 (L) 12/29/2021   PLT 158 12/29/2021      Component Value Date/Time   NA 133 (L) 12/29/2021 0410   NA 143 08/02/2020 1417   K 4.2 12/29/2021 0410   K 3.7 04/02/2017 1256   CL 102 12/29/2021 0410   CL 103 04/02/2017 1256   CO2 23 12/29/2021 0410   CO2 29 04/02/2017 1256   GLUCOSE 130 (H) 12/29/2021 0410   BUN 9 12/29/2021 0410   BUN 7 08/02/2020 1417   CREATININE 0.76 12/29/2021 0410   CREATININE 0.71 04/02/2017 1256   CALCIUM 7.4 (L) 12/29/2021 0410   CALCIUM 9.6 04/02/2017 1256   PROT 5.3 (L) 12/29/2021 0410   PROT 6.3 08/02/2020 1417   ALBUMIN 2.5 (L) 12/29/2021 0410   ALBUMIN 4.1 08/02/2020 1417   ALBUMIN 4.2 04/02/2017 1256   AST 238 (H) 12/29/2021 0410   AST 18 04/02/2017 1256   ALT 358 (H) 12/29/2021 0410   ALT 20 04/02/2017 1256   ALKPHOS 116 12/29/2021 0410   ALKPHOS 92 04/02/2017 1256   BILITOT 0.3  12/29/2021 0410   BILITOT 0.3 08/02/2020 1417   GFRNONAA >60 12/29/2021 0410   GFRAA 126 08/02/2020 1417   Lab Results  Component Value Date   CHOL 136 08/02/2020   HDL 47 08/02/2020   LDLCALC 77 08/02/2020   TRIG 58 08/02/2020   CHOLHDL 3.2 03/03/2017   Lab Results  Component Value Date   HGBA1C 5.2 03/03/2017   Lab Results  Component  Value Date   VITAMINB12 749 04/02/2017   Lab Results  Component Value Date   TSH 1.160 08/02/2020    ASSESSMENT AND PLAN 36 y.o. year old female   Relapsing remitting multiple sclerosis Postpartum, selective C-section on December 28, 2021  Personally reviewed most recent MRI of brain, cervical spine with and without contrast in October 2020, multiple rounded and oval periventricular subcortical, juxtacortical chronic demyelinating lesion, no contrast-enhancement,  Repeat MRI of the brain with without contrast  Preauthorization for Mayzent   Return to clinic with nurse practitioner in 3 to 4 months  Levert Feinstein, M.D. Ph.D.  [facility] [address] [place], [place] [address] Phone: [phone] Fax:      [phone]  Addendum: Laboratory evaluation Feb 18, 2022, normal CBC, hemoglobin of 12.8, normal liver functional test, varicella-zoster IgG 1192, was reported normal, EKG normal,

## 2022-02-05 NOTE — Telephone Encounter (Signed)
Referral for Opthalmology sent to [facility] [phone]. ?

## 2022-02-06 ENCOUNTER — Telehealth: Payer: Self-pay | Admitting: Neurology

## 2022-02-06 NOTE — Telephone Encounter (Signed)
[employer] order sent to [facility], they will obtain the auth and reach out to the patient to schedule.  

## 2022-02-11 NOTE — Telephone Encounter (Addendum)
Dr. Krista Blue has requested assessment assistance with CBC, LFTs, VZV antibody, Genotype CYP2C9, EKG, ME screening. Once these results are reviewed and dosage can be determined I will complete mayzent PA. ?

## 2022-02-18 NOTE — Telephone Encounter (Signed)
I called patient. She reports that she completed the pre assessment testing for Mayzent today. We will likely receive the results in the next few days and I will call her when Dr. Terrace Arabia reviews these results. Pt verbalized understanding. ? ?

## 2022-02-19 ENCOUNTER — Ambulatory Visit: Payer: No Typology Code available for payment source | Admitting: Neurology

## 2022-02-22 ENCOUNTER — Telehealth: Payer: Self-pay | Admitting: Neurology

## 2022-02-22 NOTE — Telephone Encounter (Signed)
Jasmine December from [facility] calling to clarify what exactly pt is being referred for.  ?States she cannot figure out exact need from the referral documents.  ?Would like a call back with clarification.  ? ? ?[phone]  ?

## 2022-02-22 NOTE — Telephone Encounter (Signed)
Pt also called, would ike to know why she is being referred to [facility] and if there were issues with her eye exam, blood work and EKG.  ?Would like a call back.  ?

## 2022-02-25 ENCOUNTER — Ambulatory Visit
Admission: RE | Admit: 2022-02-25 | Discharge: 2022-02-25 | Disposition: A | Discharge status: Home / Self Care | Payer: No Typology Code available for payment source | Source: Ambulatory Visit | Attending: Neurology | Admitting: Neurology

## 2022-02-25 ENCOUNTER — Ambulatory Visit: Payer: No Typology Code available for payment source | Admitting: Neurology

## 2022-02-25 DIAGNOSIS — G35 Multiple sclerosis: Secondary | ICD-10-CM

## 2022-02-25 MED ORDER — GADOBENATE DIMEGLUMINE 529 MG/ML IV SOLN
20.0000 mL | Freq: Once | INTRAVENOUS | Status: AC | PRN
  Administered 2022-02-25: 20 mL via INTRAVENOUS

## 2022-02-25 NOTE — Telephone Encounter (Signed)
Received EKG and ME screening results. Both are normal. Given to Dr. Krista Blue for review. ? ?Waiting for lab results. ?

## 2022-02-25 NOTE — Telephone Encounter (Signed)
I spoke to the patient. She is getting ready to start Mayzent. However, she completed the pre-assessment testing at home (including her eye exam). She does not need to see Dr. Dione Booze, in this case. I called that office and left message on the provider line with this update.  ?

## 2022-02-25 NOTE — Telephone Encounter (Signed)
I spoke to the patient. She is getting ready to start Mayzent. However, she completed the pre-assessment testing at home (including her eye exam). She does not need to see Dr. Dione Booze, in this case. I will call his office to provide this update. ?

## 2022-02-26 NOTE — Telephone Encounter (Signed)
Dr. Terrace Arabia reviewed EKG and ME and reports they are normal. Lab results not available yet for review. I checked with [employer] and the results are still pending. ?

## 2022-03-04 ENCOUNTER — Telehealth: Payer: Self-pay | Admitting: Neurology

## 2022-03-04 NOTE — Telephone Encounter (Signed)
Completed mayzent 2mg  maintenance dose PA via CMM. Sent to [facility]. Key: . ?

## 2022-03-04 NOTE — Telephone Encounter (Signed)
Pt would like to know when she can restart Mayzent, please call. ?

## 2022-03-04 NOTE — Telephone Encounter (Signed)
Completed mayzent starter kit PA via CMM. Sent to [facility]. Key: [id number]. Should have a determination within 3-5 business days. ?

## 2022-03-04 NOTE — Telephone Encounter (Signed)
I spoke with patient. Her EKG and ME screening came back normal. Her lab results are available and Dr. Terrace Arabia just needs to review them before we can clear her for therapy.  ? ?The mayzent PA was approved by [facility] from 03/04/2022 to 03/05/2023. PA# [facility] [id number]. ?

## 2022-03-04 NOTE — Telephone Encounter (Signed)
See telephone note from 03/04/22. ?

## 2022-03-04 NOTE — Telephone Encounter (Signed)
Dr. Terrace Arabia has cleared patient for [facility] therapy after reviewing patient's lab results. I have completed this on CMM. ?

## 2022-03-04 NOTE — Telephone Encounter (Signed)
Received labs from [place]. ?CYP2C9 is *1/*1 and the standard maintenance dose of 2mg  once daily is recommended. ?

## 2022-03-07 ENCOUNTER — Ambulatory Visit: Payer: No Typology Code available for payment source | Admitting: Neurology

## 2022-03-08 ENCOUNTER — Encounter: Payer: Self-pay | Admitting: Neurology

## 2022-03-12 NOTE — Telephone Encounter (Signed)
I spoke to the patient and she has been scheduled for a follow up to further discuss medication. If therapy is changed, she will need to come to the office to sign a new start form. For this reason, she opted for an in-person visit.

## 2022-03-21 ENCOUNTER — Telehealth: Payer: Self-pay | Admitting: Neurology

## 2022-03-21 ENCOUNTER — Encounter: Payer: Self-pay | Admitting: Neurology

## 2022-03-21 ENCOUNTER — Ambulatory Visit (INDEPENDENT_AMBULATORY_CARE_PROVIDER_SITE_OTHER): Payer: No Typology Code available for payment source | Admitting: Neurology

## 2022-03-21 VITALS — BP 121/88 | HR 76 | Ht 71.0 in | Wt 220.0 lb

## 2022-03-21 DIAGNOSIS — G35 Multiple sclerosis: Secondary | ICD-10-CM

## 2022-03-21 NOTE — Telephone Encounter (Signed)
Referral for Cardiology sent to [facility] [phone]. 

## 2022-03-21 NOTE — Progress Notes (Signed)
PRIMARY NEUROLOGIST: Dr.Merrie Epler  Ms. Majette is a 36 year old female with a history of relapse remitting multiple sclerosis.    She was enrolled in [place], randomized to Gilenya, since March 5th 2014.    In March 2013, she developed new-onset right eye pain and blurred vision. She was initially diagnosed with corneal abrasion. She went to the emergency room twice for this. She then followed up with ophthalmology, who ordered MRI of the brain and orbits. This showed acute right optic neuritis and 7-8 chronic demyelinating plaques. She was diagnosed with probable multiple sclerosis    Around beginning of December 2013, patient developed new symptoms of numbness in her bilateral fingertips and bilateral toes, lasted for 2 weeks.   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 Patient also has intermittent muscle spasms and balance difficulty. No family history of MS.   MRI brain showed approximately six lesions in the brain which are consistent with multiple sclerosis. there is abnormality in the right optic nerve consistent with  multiple sclerosis, see below report.  No enhancing lesions are identified.   MRI ORBITS in April 2013: Mild swelling of hyperintensity in the right optic nerve is present.    Showed abnormal  enhancement with slight stranding in the surrounding orbital fat suggesting acute inflammation.  Enhancement is most prominent within the optic canal on the right.  Optic chiasm is normal. The left optic nerve is normal.     She remains on Gilenya and is tolerating it well. She denies any new numbness or weakness. She continues to report numbness in the hands and lower extremity. She states occasionally she'll have burning and tingling pain in the right leg. The symptoms are intermittent and has occurred before. She denies any changes with her gait or balance. Denies any changes with her  vision. No change in her mood or behavior. Her last MRI of the brain was in 2017. At that time he did show 2 new lesions however the patient had been off her Gilenya for 5 months. The patient also sees a hematologist for anemia. She denies any new neurological symptoms. She returns today for an evaluation.   Update 03/16/2019 YY: There was no significant worsening of her MS symptoms, she continue have intermittent bilateral upper and lower extremity paresthesia, taking gabapentin 400 mg 3 times a day,   She denies visual loss, no significant gait abnormality, has been taking Gilenya since 2014, tolerating it well,   I personally reviewed MRI brain in Oct 2018: Multiple round and ovoid periventricular and subcortical foci of chronic demyelinating plaque, no change compared to scan in November 2019   She presented to emergency room on January 07, 2019 for fever, 104, whole body achy pain, cough, chest x-ray showed low lung volumes with bibasilar atelectasis, infiltrates, patient symptom last for 1 week, now improved, per patient, COVID-19 RNA testing was negative, but I do not find the report   Laboratory evaluations showed hemoglobin of 12.6, MCV of 75.4, platelet was 132, BMP showed potassium of 3.3, glucose was elevated 115,   UPDATE June 04 2021: She stopped Gilenya about a week ago, reported home testing was positive twice for pregnancy, it seems to be the similar story in May 2022, hCG beta chain was strongly positive then, pregnancy was terminated   She denies any new flareups of MS symptoms, overall doing well, working from home  Update December 05, 2021 SS: Is  pregnant [redacted] weeks 5 days, due date is February 15, 2022. Having a girl. Pregnancy is going well so far, no issues.  On prenatal vitamin.  Denies any MS symptoms.  At times, may have tingling/numbness to her hands intermittently.  Does not plan to breast-feed.  Remains off Gilenya.  UPDATE February 05 2022: She stop Gilenya in August 2022,  had a scheduled C-section on March 10th 2023, due to breech presentation, preeclampsia, with normal healthy baby girl  She denied new neurological symptoms, has intermittent bilateral hands paresthesia, no gait abnormality, She was very happy with the result of Gilenya, wants to go back on it, no longer breast-feeding  Update March 21, 2022 She denies any new neurological symptoms, previously we went over Mayzent related information in detail, she also went through [employer] sponsored premedication evaluation, including EKG, ophthalmology evaluation, but patient stated, after she was contacted by company representative, she becomes so worried about the potential side effect of her medications, adamantly decided to go back on Gilenya, which she has been taking for many years, familiar with it, understands she needs to be evaluated, and go through in-office first dose observation  Reviewed repeat MRI of the brain and cervical spine with without contrast on Feb 25, 2022, multiple periventricular, subcortical and brainstem chronical demyelinating disease, no contrast-enhancement, no change compared to MRI in October 2020, MRI of cervical spine showed ill-defined spinal cord hyperintensity throughout cervical region most prominent at C2, C3-4, and C5 no contrast-enhancement  Laboratory evaluation 2023 normal CBC, CMP  REVIEW OF SYSTEMS: Out of a complete 14 system review of symptoms, the patient complains only of the following symptoms, and all other reviewed systems are negative.  See HPI  ALLERGIES: No Known Allergies  HOME MEDICATIONS: Outpatient Medications Prior to Visit  Medication Sig Dispense Refill   Prenatal Vit-Fe Fumarate-FA (PRENATAL VITAMIN) 27-0.8 MG TABS Take 1 tablet by mouth daily. 30 tablet 12   Facility-Administered Medications Prior to Visit  Medication Dose Route Frequency Provider Last Rate Last Admin   gadopentetate dimeglumine (MAGNEVIST) injection 20 mL  20 mL Intravenous  Once PRN Butch Penny, NP        PAST MEDICAL HISTORY: Past Medical History:  Diagnosis Date   Anemia    Low back pain 01/20/2013   Multiple sclerosis (HCC) 01/12/2013   Trichomoniasis     PAST SURGICAL HISTORY: Past Surgical History:  Procedure Laterality Date   CESAREAN SECTION N/A 12/28/2021   Procedure: CESAREAN SECTION;  Surgeon: Carlisle Cater, MD;  Location: [facility] ORS;  Service: Obstetrics;  Laterality: N/A;   LAPAROSCOPY Right 02/28/2021   Procedure: LAPAROSCOPY DIAGNOSTIC;  Surgeon: Charlett Nose, MD;  Location: [facility] OR;  Service: Gynecology;  Laterality: Right;    FAMILY HISTORY: Family History  Problem Relation Age of Onset   Healthy Mother    Hypertension Maternal Grandmother    Diabetes Maternal Grandmother    Hypertension Paternal Grandmother     SOCIAL HISTORY: Social History   Socioeconomic History   Marital status: Married    Spouse name: Not on file   Number of children: 0   Years of education: 12   Highest education level: Not on file  Occupational History    Comment: [employer]  Tobacco Use   Smoking status: Never   Smokeless tobacco: Never  Vaping Use   Vaping Use: Never used  Substance and Sexual Activity   Alcohol use: No   Drug use: Never   Sexual activity: Yes  Partners: Male    Birth control/protection: None  Other Topics Concern   Not on file  Social History Narrative   Patient is working at [employer] time., lives with her husband, no children.   Patient is right handed.   Patient has a high school education and some college.   Patient does not drink caffeine.   Social Determinants of Corporate investment banker Strain: Not on file  Food Insecurity: No Food Insecurity   Worried About Programme researcher, broadcasting/film/video in the Last Year: Never true   Ran Out of Food in the Last Year: Never true  Transportation Needs: Not on file  Physical Activity: Not on file  Stress: Not on file  Social Connections: Not on  file  Intimate Partner Violence: Not on file   PHYSICAL EXAM  Vitals:   03/21/22 1309  BP: 121/88  Pulse: 76  Weight: 220 lb (99.8 kg)  Height: 5\' 11"  (1.803 m)   Body mass index is 30.68 kg/m.  Generalized: Well developed, in no acute distress    PHYSICAL EXAMNIATION:  Gen: NAD, conversant, well nourised, well groomed                     Cardiovascular: Regular rate rhythm, no peripheral edema, warm, nontender. Eyes: Conjunctivae clear without exudates or hemorrhage Neck: Supple, no carotid bruits. Pulmonary: Clear to auscultation bilaterally   NEUROLOGICAL EXAM:  MENTAL STATUS: Speech/cognition: Awake, alert, oriented to history taking and casual conversation   CRANIAL NERVES: CN II: Visual fields are full to confrontation.  Pupils are round equal and briskly reactive to light. CN III, IV, VI: extraocular movement are normal. No ptosis. CN V: Facial sensation is intact to pinprick in all 3 divisions bilaterally. Corneal responses are intact.  CN VII: Face is symmetric with normal eye closure and smile. CN VIII: Hearing is normal to casual conversation CN IX, X: Palate elevates symmetrically. Phonation is normal. CN XI: Head turning and shoulder shrug are intact  MOTOR: There is no pronator drift of out-stretched arms. Muscle bulk and tone are normal. Muscle strength is normal.  REFLEXES: Reflexes are 2+ and symmetric at the biceps, triceps, knees, and ankles. Plantar responses are flexor.  SENSORY: Intact to light touch, pinprick, positional and vibratory sensation are intact in fingers and toes.  COORDINATION: Rapid alternating movements and fine finger movements are intact. There is no dysmetria on finger-to-nose and heel-knee-shin.    GAIT/STANCE: Posture is normal. Gait is steady   DIAGNOSTIC DATA (LABS, IMAGING, TESTING) - I reviewed patient records, labs, notes, testing and imaging myself where available.  Lab Results  Component Value Date   WBC  20.7 (H) 12/29/2021   HGB 10.1 (L) 12/29/2021   HCT 29.4 (L) 12/29/2021   MCV 77.4 (L) 12/29/2021   PLT 158 12/29/2021      Component Value Date/Time   NA 133 (L) 12/29/2021 0410   NA 143 08/02/2020 1417   K 4.2 12/29/2021 0410   K 3.7 04/02/2017 1256   CL 102 12/29/2021 0410   CL 103 04/02/2017 1256   CO2 23 12/29/2021 0410   CO2 29 04/02/2017 1256   GLUCOSE 130 (H) 12/29/2021 0410   BUN 9 12/29/2021 0410   BUN 7 08/02/2020 1417   CREATININE 0.76 12/29/2021 0410   CREATININE 0.71 04/02/2017 1256   CALCIUM 7.4 (L) 12/29/2021 0410   CALCIUM 9.6 04/02/2017 1256   PROT 5.3 (L) 12/29/2021 0410   PROT 6.3 08/02/2020  1417   ALBUMIN 2.5 (L) 12/29/2021 0410   ALBUMIN 4.1 08/02/2020 1417   ALBUMIN 4.2 04/02/2017 1256   AST 238 (H) 12/29/2021 0410   AST 18 04/02/2017 1256   ALT 358 (H) 12/29/2021 0410   ALT 20 04/02/2017 1256   ALKPHOS 116 12/29/2021 0410   ALKPHOS 92 04/02/2017 1256   BILITOT 0.3 12/29/2021 0410   BILITOT 0.3 08/02/2020 1417   GFRNONAA >60 12/29/2021 0410   GFRAA 126 08/02/2020 1417   Lab Results  Component Value Date   CHOL 136 08/02/2020   HDL 47 08/02/2020   LDLCALC 77 08/02/2020   TRIG 58 08/02/2020   CHOLHDL 3.2 03/03/2017   Lab Results  Component Value Date   HGBA1C 5.2 03/03/2017   Lab Results  Component Value Date   VITAMINB12 749 04/02/2017   Lab Results  Component Value Date   TSH 1.160 08/02/2020    ASSESSMENT AND PLAN 36 y.o. year old female   Relapsing remitting multiple sclerosis Postpartum, selective C-section on December 28, 2021  Most recent MRI of of brain and cervical spine with and without contrast in May 2023, showed no enhancing lesion, evidence of spinal cord involvement Previously patient was evaluated for Mayzent treatment, now worried about the potential side effect, she adamantly want to proceed with Gilenya, which she has been familiar with,  Referred to cardiologist, first dose observation will be done at  cardiologist office  Levert Feinstein, M.D. Ph.D.  [facility] [address] [place], [place] [address] Phone: [phone] Fax:      [phone]  Addendum: Laboratory evaluation Feb 18, 2022, normal CBC, hemoglobin of 12.8, normal liver functional test, varicella-zoster IgG 1192, was reported normal, EKG normal,

## 2022-03-25 ENCOUNTER — Telehealth: Payer: Self-pay

## 2022-03-25 NOTE — Telephone Encounter (Signed)
Received this notice from [employer]: "Thank you for reaching out. Unfortunately, due to the loss of exclusivity of Gilenya , we no longer have a HUB.  If a provider wishes to Rx Gilenya, the process would be in your hands regarding testing, PAs etc.   There is a Gilenya copay support line through [employer]  and that number it [phone]."

## 2022-03-25 NOTE — Telephone Encounter (Signed)
Received Gilenya Start form from Dr. Terrace Arabia. Patient will need an FDO at the cardiology office but is cleared for therapy.  Faxed start form to [employer]. Received a receipt of confirmation.  Completed PA for gilenya via CMM. Sent to [facility]. Gilenya is not a preferred product any longer and she may need to try fingolimod first. Should have a determination within 3-5 business days. Key: [id number].

## 2022-03-26 MED ORDER — FINGOLIMOD HCL 0.5 MG PO CAPS: 0.5000 mg | ORAL_CAPSULE | Freq: Every day | ORAL | 5 refills | Status: DC

## 2022-03-26 MED ORDER — GABAPENTIN 400 MG PO CAPS: 400.0000 mg | ORAL_CAPSULE | Freq: Three times a day (TID) | ORAL | 5 refills | Status: DC | PRN

## 2022-03-26 NOTE — Telephone Encounter (Signed)
PA for fingolimod was approved from 03/26/22-03/27/23 by [facility]. PA# [id number] [id number].  I spoke with Dr. Terrace Arabia. She is agreeable to patient resuming gabapentin 400mg  TID while on fingolimod.  I called patient. I discussed this with her. If the gabapentin 400mg  TID makes her too sleepy she can use it 1-2 tablets daily. She will wait for a call for the [facility] for fingolimod.

## 2022-03-26 NOTE — Telephone Encounter (Addendum)
I called patient.  I discussed that her insurance denied coverage of Gilenya but prefers fingolimod.  Patient is agreeable to using fingolimod as long as Dr. Krista Blue is agreeable as well.  I spoke with Dr. Krista Blue.  She is agreeable to using fingolimod with patient.  Patient will still need an FDO at the cardiology office prior to starting fingolimod.  I called patient.  I discussed this with her.  She has not yet gotten a call from the cardiology office for the [facility], [facility], [facility].  I advised her we will send the fingolimod prescription to [facility].  If [facility] pharmacy is not in network with her insurance they will let us know which pharmacy is.  I will complete a prior authorization as needed.  Patient said that I may communicate with her via MyChart regarding this.  Patient understands not to start the fingolimod until her FDO appointment with the cardiovascular office.  Patient reports that Gilenya caused her to have muscle spasms and therefore she took gabapentin 400mg  TID to help with this. She is wondering if Dr. Krista Blue will prescribe this for her again.  Completed PA for fingolimod via CMM. Sent to [facility]. Key: [id number]. Should have a determination within 3-5 business days.

## 2022-03-26 NOTE — Telephone Encounter (Signed)
PA for Gilenya was denied by [facility]: "The preferred products for the patient's health plan are Aubagio, Avonex, Betaseron, Copaxone, dimethyl fumarate, fingolimod, glatiramer, Glatopa, Kesimpta, Mayzent, Ocrevus, Rebif, Tysabri, Vumerity, and Zeposia. Current plan approved criteria does not allow coverage of Gilenya unless both of the following conditions are met: a) the patient tried and had a bad side effect with the preferred product generic fingolimod and the bad side effect was not an expected side effect of the active ingredient, and b) the patient has tried at least two of the preferred products other than generic fingolimod and they didn't work well or the patient had a bad side effect."

## 2022-04-01 MED ORDER — FINGOLIMOD HCL 0.5 MG PO CAPS: 0.5000 mg | ORAL_CAPSULE | Freq: Every day | ORAL | 5 refills | Status: DC

## 2022-04-01 NOTE — Addendum Note (Signed)
Addended by: Geronimo Running A on: 04/01/2022 04:12 PM   Modules accepted: Orders

## 2022-04-01 NOTE — Telephone Encounter (Signed)
[facility].  This was a 25-minute phone call.  I provided the pharmacist,Christine, with a verbal order for fingolimod.  They did receive a paid claim for $0 for 90 days.  [facility] to schedule delivery.

## 2022-04-01 NOTE — Telephone Encounter (Signed)
I called patient. She has not gotten a call from [facility] to schedule delivery of the fingolimod.   She scheduled her cardiology consult for 04/05/22. The FDO will be scheduled after that appointment.  I reached out to [facility] to find out the status of the patient's fingolimod.

## 2022-04-03 ENCOUNTER — Ambulatory Visit: Payer: No Typology Code available for payment source | Admitting: Cardiology

## 2022-04-05 ENCOUNTER — Ambulatory Visit: Payer: No Typology Code available for payment source | Admitting: Cardiology

## 2022-04-05 ENCOUNTER — Encounter: Payer: Self-pay | Admitting: Cardiology

## 2022-04-05 VITALS — BP 121/84 | HR 63 | Temp 97.3°F | Resp 16 | Ht 71.0 in | Wt 219.4 lb

## 2022-04-05 DIAGNOSIS — Z5181 Encounter for therapeutic drug level monitoring: Secondary | ICD-10-CM

## 2022-04-05 DIAGNOSIS — G35 Multiple sclerosis: Secondary | ICD-10-CM

## 2022-04-05 NOTE — Progress Notes (Signed)
ID:  Sharmane Dame, DOB 1986-06-03, MRN [id number]  PCP:  Patient, No Pcp Per  Cardiologist:  Tessa Lerner, DO, [facility] (established care/ 616/2023)  REASON FOR CONSULT: Gilenya initiation  REQUESTING PHYSICIAN:  Levert Feinstein, MD [address] [place],  [place] [address]  Chief Complaint  Patient presents with   GILENYA work up   New Patient (Initial Visit)    Referred by Dr. Terrace Arabia    HPI  Carol [name] is a 36 y.o. African-American female who presents to the clinic for evaluation of Gilenya initiation at the request of Levert Feinstein, MD. Her past medical history and cardiovascular risk factors include: Multiple sclerosis.   Patient has a diagnosis of multiple sclerosis for which she was Gilenya in the past.  But has been off of it since July 2022.  She would like to restart Gilenya and is now referred to the practice at the request of Dr. Terrace Arabia as that she needs to be monitored in office for the first dose. She has been on Gilenya since 2013 and stopped in July 2022 due to pregnancy.   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   No family history of premature coronary disease or sudden cardiac death.  FUNCTIONAL STATUS: In house exercises on youtube twice a week, hour each.    ALLERGIES: No Known Allergies  MEDICATION LIST PRIOR TO VISIT: No outpatient medications have been marked as taking for the 04/05/22 encounter (Office Visit) with Odis Hollingshead, Rhyse Loux, DO.     PAST MEDICAL HISTORY: Past Medical History:  Diagnosis Date   Anemia    Low back pain 01/20/2013   Multiple sclerosis (HCC) 01/12/2013   Trichomoniasis     PAST SURGICAL HISTORY: Past Surgical History:  Procedure Laterality Date   CESAREAN SECTION N/A 12/28/2021   Procedure: CESAREAN SECTION;  Surgeon: Carlisle Cater, MD;  Location: [facility]S;  Service: Obstetrics;  Laterality: N/A;   LAPAROSCOPY Right 02/28/2021   Procedure: LAPAROSCOPY DIAGNOSTIC;  Surgeon: Charlett Nose, MD;  Location: [facility];   Service: Gynecology;  Laterality: Right;    FAMILY HISTORY: The patient family history includes Diabetes in her maternal grandmother; Healthy in her mother; Heart disease in her paternal grandmother; Heart failure in her paternal grandmother; Hypertension in her maternal grandmother and paternal grandmother.  SOCIAL HISTORY:  The patient  reports that she has never smoked. She has never used smokeless tobacco. She reports that she does not drink alcohol and does not use drugs.  REVIEW OF SYSTEMS: Review of Systems  Cardiovascular:  Negative for chest pain, dyspnea on exertion, leg swelling, orthopnea, palpitations, paroxysmal nocturnal dyspnea and syncope.  Respiratory:  Negative for shortness of breath.     PHYSICAL EXAM:    04/05/2022    9:29 AM 03/21/2022    1:09 PM 02/05/2022   11:04 AM  Vitals with BMI  Height 5\' 11"  5\' 11"  5\' 11"   Weight 219 lbs 6 oz 220 lbs 229 lbs  BMI 30.61 30.7 31.95  Systolic 121 121  Diastolic 84 88 79  Pulse 63 76 78    CONSTITUTIONAL: Well-developed and well-nourished. No acute distress.  SKIN: Skin is warm and dry. No rash noted. No cyanosis. No pallor. No jaundice HEAD: Normocephalic and atraumatic.  EYES: No scleral icterus MOUTH/THROAT: Moist oral membranes.  NECK: No JVD present. No thyromegaly noted. No carotid bruits  CHEST Normal respiratory effort. No intercostal retractions  LUNGS: Clear to auscultation bilaterally.  No stridor. No wheezes.  No rales.  CARDIOVASCULAR: Regular rate and rhythm, positive S1-S2, no murmurs rubs or gallops appreciated. ABDOMINAL: Soft, nontender, nondistended, positive bowel sounds in all 4 quadrants no apparent ascites.  EXTREMITIES: No peripheral edema, warm to touch, 2+ bilateral DP and PT pulses HEMATOLOGIC: No significant bruising NEUROLOGIC: Oriented to person, place, and time. Nonfocal. Normal muscle tone.  PSYCHIATRIC: Normal mood and affect. Normal behavior. Cooperative  CARDIAC  DATABASE: EKG: 04/05/2022: Sinus rhythm, 61 bpm, without underlying ischemia or injury pattern.  Echocardiogram: No results found for this or any previous visit from the past 1095 days.    Stress Testing: No results found for this or any previous visit from the past 1095 days.   Heart Catheterization: None  LABORATORY DATA:    Latest Ref Rng & Units 12/29/2021    4:10 AM 12/28/2021    9:36 AM 12/28/2021    5:02 AM  CBC  WBC 4.0 - 10.5 K/uL 20.7  12.2  11.6   Hemoglobin 12.0 - 15.0 g/dL 15.1  76.1  60.7   Hematocrit 36.0 - 46.0 % 29.4  33.1  32.8   Platelets 150 - 400 K/uL 158  157  165        Latest Ref Rng & Units 12/29/2021    4:10 AM 12/28/2021    5:02 AM 12/27/2021   12:48 PM  CMP  Glucose 70 - 99 mg/dL 371  062  79   BUN 6 - 20 mg/dL 9  7  7    Creatinine 0.44 - 1.00 mg/dL  6.94  8.54   Sodium 135 - 145 mmol/L 133  133  136   Potassium 3.5 - 5.1 mmol/L 4.2  3.9  3.9   Chloride 98 - 111 mmol/L 102  105  104   CO2 22 - 32 mmol/L 23  20  23    Calcium 8.9 - 10.3 mg/dL 7.4  8.5  9.5   Total Protein 6.5 - 8.1 g/dL 5.3  6.1  6.0   Total Bilirubin 0.3 - 1.2 mg/dL 0.3  0.4  0.4   Alkaline Phos 38 - 126 U/L 116  132  150   AST 15 - 41 U/L 238  155  111   ALT 0 - 44 U/L 358  233  173     Lipid Panel     Component Value Date/Time   CHOL 136 08/02/2020 1009   TRIG 58 08/02/2020 1009   HDL 47 08/02/2020 1009   CHOLHDL 3.2 03/03/2017 1610   VLDL 13 03/03/2017 1610   LDLCALC 77 08/02/2020 1009   LABVLDL 12 08/02/2020 1009    No components found for: "NTPROBNP" No results for input(s): "PROBNP" in the last 8760 hours. No results for input(s): "TSH" in the last 8760 hours.  BMP Recent Labs    12/27/21 1248 12/28/21 0502 12/29/21 0410  NA 136 133* 133*  K 3.9 3.9 4.2  CL 104 105 102  CO2 23 20* 23  GLUCOSE 79 107* 130*  BUN 7 7 9   CREATININE 0.68 0.69 0.76  CALCIUM 9.5 8.5* 7.4*  GFRNONAA >60 >60 >60    HEMOGLOBIN A1C Lab Results  Component Value Date    HGBA1C 5.2 03/03/2017   MPG 103 03/03/2017    IMPRESSION:    ICD-10-CM   1. Multiple sclerosis (HCC)  G35 EKG 12-Lead    2. Encounter for medication monitoring  Z51.81 EKG 12-Lead       RECOMMENDATIONS: Carol [name] is a 36 y.o. African-American female whose  past medical history and cardiac risk factors include: Multiple sclerosis.  Patient was diagnosed with multiple sclerosis back in 2013 and since then has been on medical therapy.  One of the medications that she was on was Gilenya which was discontinued in July 2022 when she was pregnant.  She would like to resume the medication and therefore was referred to cardiology for further evaluation and management.  Patient denies very well aware of the medication profile and side effects and feels comfortable with that.  She understands that she needs to be monitored in the clinic for the first dose administration with repeat vital signs and EKG.  Patient states that she should have the medication by 04/10/2022.  FINAL MEDICATION LIST END OF ENCOUNTER: No orders of the defined types were placed in this encounter.   There are no discontinued medications.   Current Outpatient Medications:    Fingolimod HCl 0.5 MG CAPS, Take 1 capsule (0.5 mg total) by mouth daily., Disp: 30 capsule, Rfl: 5   gabapentin (NEURONTIN) 400 MG capsule, Take 1 capsule (400 mg total) by mouth 3 (three) times daily as needed., Disp: 90 capsule, Rfl: 5   Prenatal Vit-Fe Fumarate-FA (PRENATAL VITAMIN) 27-0.8 MG TABS, Take 1 tablet by mouth daily., Disp: 30 tablet, Rfl: 12 No current facility-administered medications for this visit.  Facility-Administered Medications Ordered in Other Visits:    gadopentetate dimeglumine (MAGNEVIST) injection 20 mL, 20 mL, Intravenous, Once PRN, Butch Penny, NP  Orders Placed This Encounter  Procedures   EKG 12-Lead    There are no Patient Instructions on file for this visit.   --Continue cardiac medications as  reconciled in final medication list. --Return in about 2 weeks (around 04/19/2022) for Scheduled for Gilenya initiation.. or sooner if needed. --Continue follow-up with your primary care physician regarding the management of your other chronic comorbid conditions.  Patient's questions and concerns were addressed to her satisfaction. She voices understanding of the instructions provided during this encounter.   This note was created using a voice recognition software as a result there may be grammatical errors inadvertently enclosed that do not reflect the nature of this encounter. Every attempt is made to correct such errors.  Tessa Lerner, [place], [facility]  Pager: [phone] Office: [phone]

## 2022-04-18 ENCOUNTER — Ambulatory Visit: Payer: No Typology Code available for payment source | Admitting: Cardiology

## 2022-05-03 ENCOUNTER — Encounter: Payer: Self-pay | Admitting: Cardiology

## 2022-05-03 ENCOUNTER — Ambulatory Visit: Payer: No Typology Code available for payment source | Admitting: Cardiology

## 2022-05-03 VITALS — BP 106/74 | HR 70 | Temp 97.1°F | Resp 16 | Ht 71.0 in | Wt 216.0 lb

## 2022-05-03 DIAGNOSIS — Z5181 Encounter for therapeutic drug level monitoring: Secondary | ICD-10-CM

## 2022-05-03 DIAGNOSIS — Z79899 Other long term (current) drug therapy: Secondary | ICD-10-CM

## 2022-05-03 DIAGNOSIS — G35 Multiple sclerosis: Secondary | ICD-10-CM

## 2022-05-03 NOTE — Progress Notes (Signed)
   Carol [name] Date of Birth: April 10, 1986 MRN: [id number]  Date: 05/03/22 Last Office Visit: 04/05/2022  Chief Complaint  Patient presents with   Gilenya initiation    Vital Signs:  8:57am: 110/80, pulse 88, respirations 16, temperature 97.1, 96% on room air.  10:10am: 116/84, pulse 82 bpm, 97% on room air 11:14 AM: 110/79, pulse 82 bpm, 97% on room air 12:14 PM: 110/78, pulse 75 bpm, 97% on room air 01:27 PM: 111/78, pulse 58 bpm, 2:21 PM: 108/80, pulse of 72 bpm, 96% on room air. 3:04 PM: 106/74, pulse 70 bpm, 98% on room air   EKGs:  05/03/2022:  9:02am: Normal sinus rhythm, 76 bpm, normal axis, QTc 490 ms.  10:18am: Sinus rhythm, 74 bpm, QTc 408 ms. 11:20am: NSR, 77 bpm, normal axis, QTc 414 ms.   12:27pm: Sinus rhythm, 68 bpm, QTc 402 ms.   13:26: Normal sinus rhythm, 69 bpm, QTc 409 ms. 14:25: Normal sinus rhythm, 66 bpm, normal axis, QTc 396 ms 15:02: NSR, 64bpm, QTc .  Patient has remained asymptomatic during initiation of Gilenya.   Patient is ambulated in the office and no episodes of lightheadedness, dizziness, near-syncope or syncope.  Hemodynamically remains stable.  Recommend follow-up in 3 months and as needed thereafter.  Tessa Lerner, [place], [facility]  Pager: [phone] Office: [phone]

## 2022-05-06 NOTE — Telephone Encounter (Signed)
Patient's fingolimod FDO was on 05/03/2022 and she tolerated it well per cardiology notes.

## 2022-05-08 ENCOUNTER — Ambulatory Visit: Payer: No Typology Code available for payment source | Admitting: Neurology

## 2022-09-04 ENCOUNTER — Other Ambulatory Visit: Payer: Self-pay | Admitting: Neurology

## 2022-09-26 ENCOUNTER — Ambulatory Visit: Payer: No Typology Code available for payment source | Admitting: Neurology

## 2022-09-26 IMAGING — US US OB TRANSVAGINAL
1 series · 15 of 28 positions shown · non-contrast
Comparison: 02/28/2021

CLINICAL DATA: Pregnant, increasing abdominal cramping and vaginal
bleeding. LMP 09/22/2021

EXAM:
TRANSVAGINAL OB ULTRASOUND
TECHNIQUE: Transvaginal ultrasound was performed for complete evaluation of the
gestation as well as the maternal uterus, adnexal regions, and
pelvic cul-de-sac.

[Series 1: us ob transvaginal · 34 acquisitions, 15 frames shown]
[im 1/34]
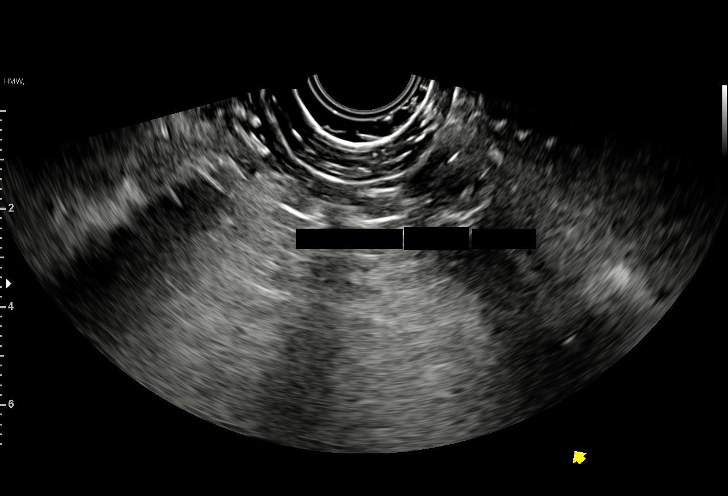
[im 3/34]
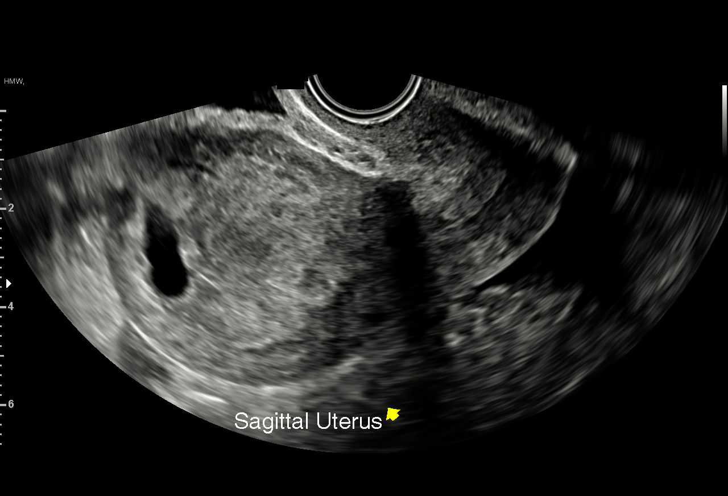
[im 5/34]
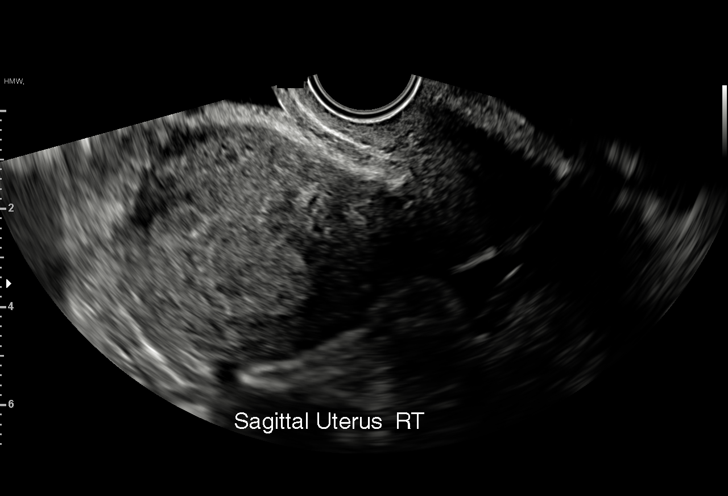
[im 8/34]
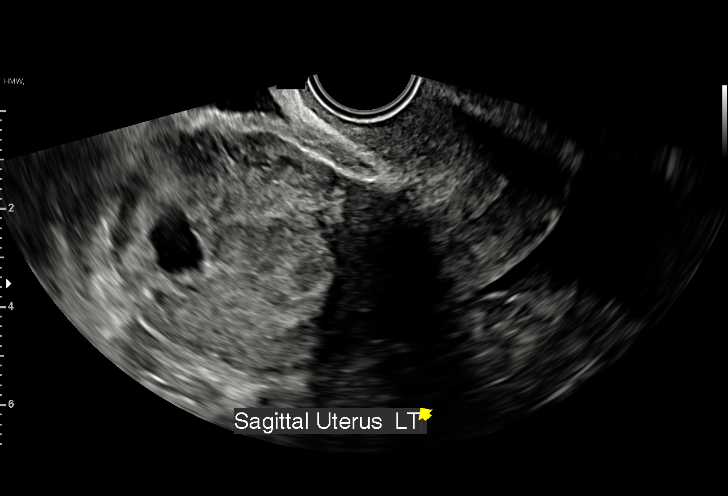
[im 10/34]
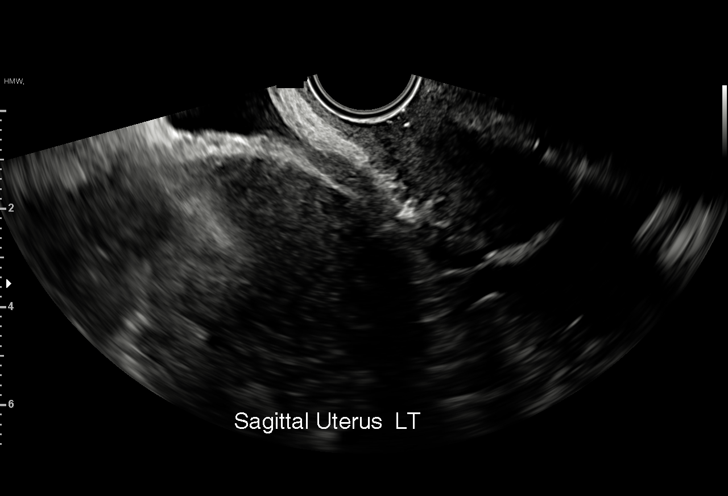
[im 13/34]
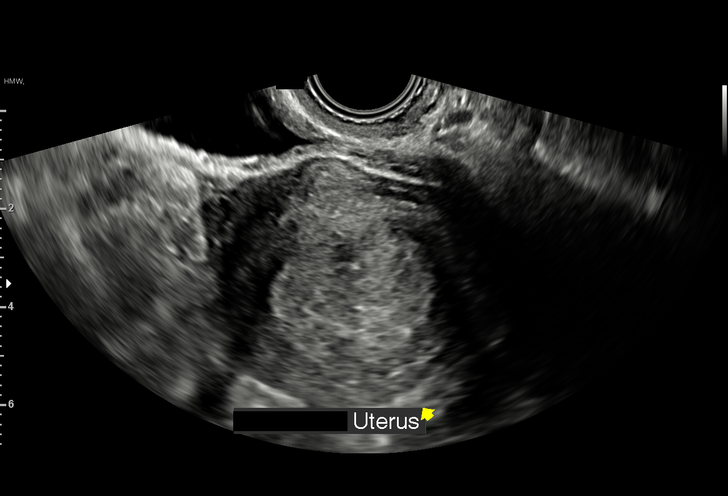
[im 15/34]
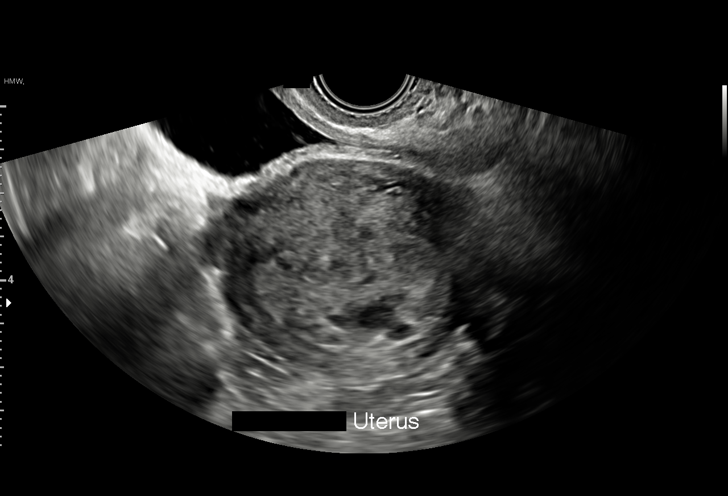
[im 18/34]
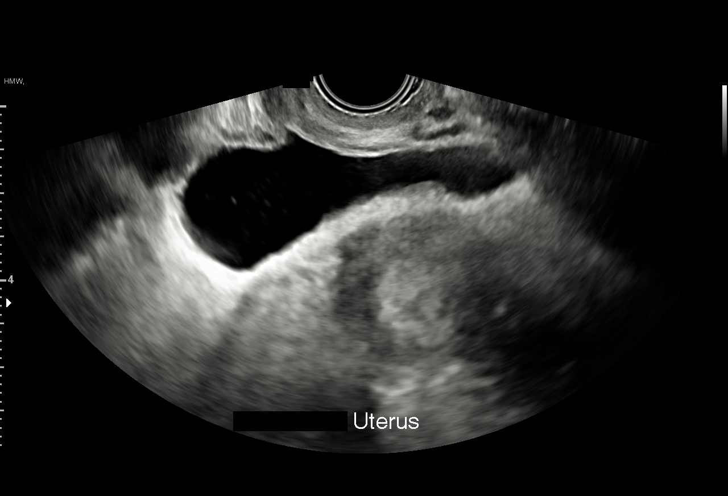
[im 19/34]
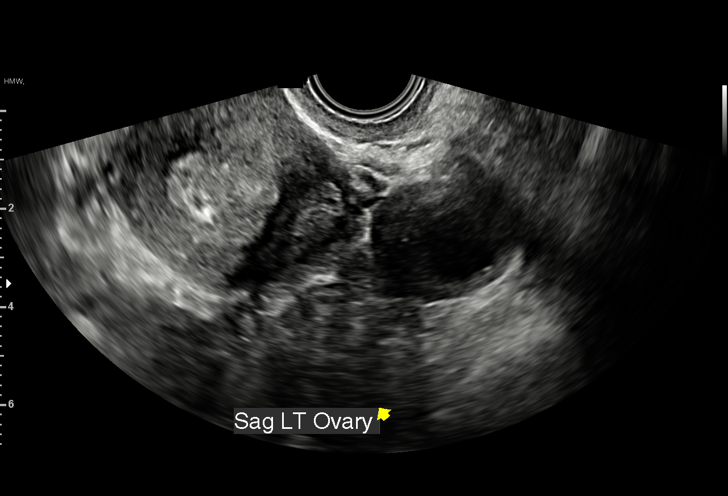
[im 21/34]
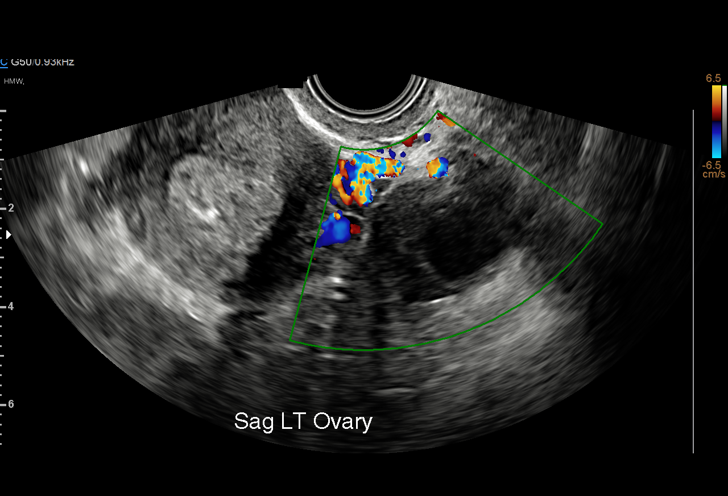
[im 24/34]
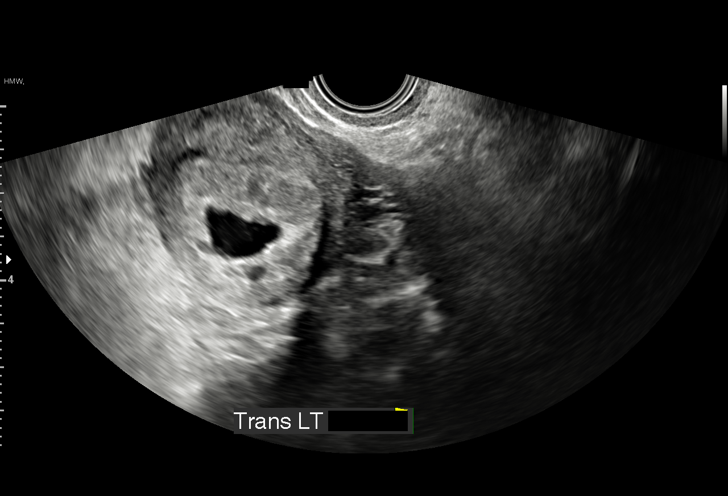
[im 26/34]
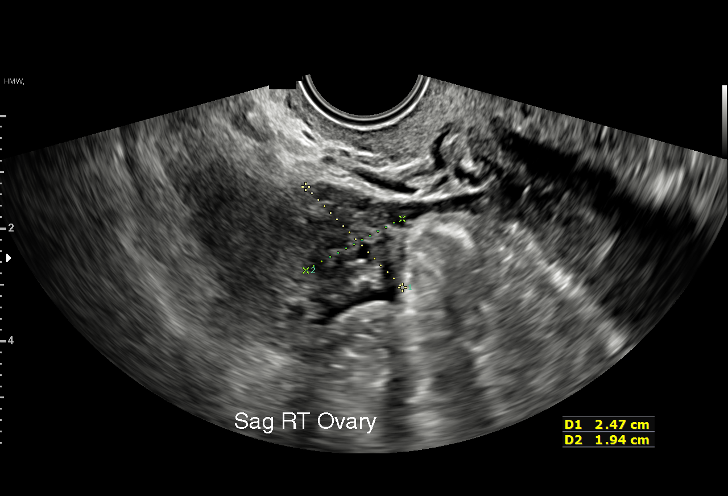
[im 29/34]
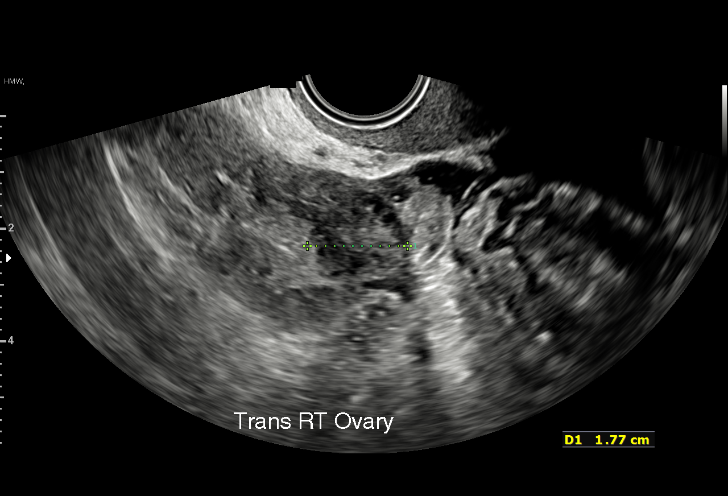
[im 31/34]
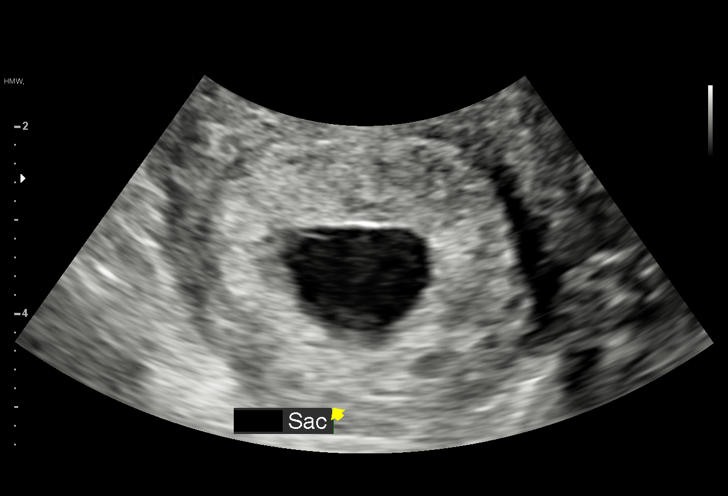
[im 34/34]
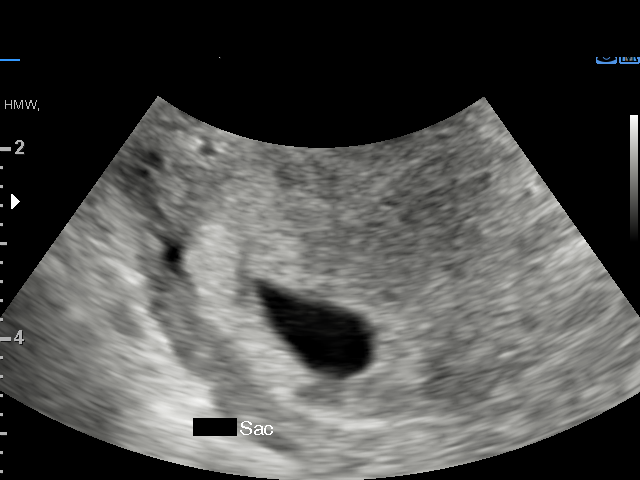

[15 of 28 positions shown; findings below may reference images not displayed]

FINDINGS: Intrauterine gestational sac: Present, single, abnormal containing a
small amount of echogenic debris.

Yolk sac:  Not visualized

Embryo:  Not visualized

Cardiac Activity: Not applicable

MSD: 15 mm   6 w   2 d

Subchorionic hemorrhage: Small subchorionic hemorrhage is
identified, best noted on cine images.

Maternal uterus/adnexae: The uterus is anteverted. No intrauterine
masses are seen. The cervix is closed and is unremarkable. There is
no free fluid within the a pelvis; previously noted free
intraperitoneal fluid has resolved. Corpus luteum noted within the
left ovary. The maternal ovaries are otherwise unremarkable.
IMPRESSION: Persistent nonvisualization of fetal structures and inappropriate
progression since prior examination compatible with an anembryonic
pregnancy.

## 2022-09-26 NOTE — Progress Notes (Deleted)
PRIMARY NEUROLOGIST: Dr.Yan  Carol [name] is a 36 year old female with a history of relapse remitting multiple sclerosis.    She was enrolled in [place], randomized to Gilenya, since March 5th 2014.    In March 2013, she developed new-onset right eye pain and blurred vision. She was initially diagnosed with corneal abrasion. She went to the emergency room twice for this. She then followed up with ophthalmology, who ordered MRI of the brain and orbits. This showed acute right optic neuritis and 7-8 chronic demyelinating plaques. She was diagnosed with probable multiple sclerosis    Around beginning of December 2013, patient developed new symptoms of numbness in her bilateral fingertips and bilateral toes, lasted for 2 weeks.   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 Patient also has intermittent muscle spasms and balance difficulty. No family history of MS.   MRI brain showed approximately six lesions in the brain which are consistent with multiple sclerosis. there is abnormality in the right optic nerve consistent with  multiple sclerosis, see below report.  No enhancing lesions are identified.   MRI ORBITS in April 2013: Mild swelling of hyperintensity in the right optic nerve is present.    Showed abnormal  enhancement with slight stranding in the surrounding orbital fat suggesting acute inflammation.  Enhancement is most prominent within the optic canal on the right.  Optic chiasm is normal. The left optic nerve is normal.     She remains on Gilenya and is tolerating it well. She denies any new numbness or weakness. She continues to report numbness in the hands and lower extremity. She states occasionally she'll have burning and tingling pain in the right leg. The symptoms are intermittent and has occurred before. She denies any changes with her gait or balance. Denies any changes with her  vision. No change in her mood or behavior. Her last MRI of the brain was in 2017. At that time he did show 2 new lesions however the patient had been off her Gilenya for 5 months. The patient also sees a hematologist for anemia. She denies any new neurological symptoms. She returns today for an evaluation.   Update 03/16/2019 YY: There was no significant worsening of her MS symptoms, she continue have intermittent bilateral upper and lower extremity paresthesia, taking gabapentin 400 mg 3 times a day,   She denies visual loss, no significant gait abnormality, has been taking Gilenya since 2014, tolerating it well,   I personally reviewed MRI brain in Oct 2018: Multiple round and ovoid periventricular and subcortical foci of chronic demyelinating plaque, no change compared to scan in November 2019   She presented to emergency room on January 07, 2019 for fever, 104, whole body achy pain, cough, chest x-ray showed low lung volumes with bibasilar atelectasis, infiltrates, patient symptom last for 1 week, now improved, per patient, COVID-19 RNA testing was negative, but I do not find the report   Laboratory evaluations showed hemoglobin of 12.6, MCV of 75.4, platelet was 132, BMP showed potassium of 3.3, glucose was elevated 115,   UPDATE June 04 2021: She stopped Gilenya about a week ago, reported home testing was positive twice for pregnancy, it seems to be the similar story in May 2022, hCG beta chain was strongly positive then, pregnancy was terminated   She denies any new flareups of MS symptoms, overall doing well, working from home  Update December 05, 2021 SS: Is  pregnant [redacted] weeks 5 days, due date is February 15, 2022. Having a girl. Pregnancy is going well so far, no issues.  On prenatal vitamin.  Denies any MS symptoms.  At times, may have tingling/numbness to her hands intermittently.  Does not plan to breast-feed.  Remains off Gilenya.  UPDATE February 05 2022: She stop Gilenya in August 2022,  had a scheduled C-section on March 10th 2023, due to breech presentation, preeclampsia, with normal healthy baby girl  She denied new neurological symptoms, has intermittent bilateral hands paresthesia, no gait abnormality, She was very happy with the result of Gilenya, wants to go back on it, no longer breast-feeding  Update March 21, 2022 She denies any new neurological symptoms, previously we went over Mayzent related information in detail, she also went through [employer] sponsored premedication evaluation, including EKG, ophthalmology evaluation, but patient stated, after she was contacted by company representative, she becomes so worried about the potential side effect of her medications, adamantly decided to go back on Gilenya, which she has been taking for many years, familiar with it, understands she needs to be evaluated, and go through in-office first dose observation  Reviewed repeat MRI of the brain and cervical spine with without contrast on Feb 25, 2022, multiple periventricular, subcortical and brainstem chronical demyelinating disease, no contrast-enhancement, no change compared to MRI in October 2020, MRI of cervical spine showed ill-defined spinal cord hyperintensity throughout cervical region most prominent at C2, C3-4, and C5 no contrast-enhancement  Laboratory evaluation 2023 normal CBC, CMP  Update September 26, 2022 SS: She was restarted on Gilenya, FDO was 05/03/22  REVIEW OF SYSTEMS: Out of a complete 14 system review of symptoms, the patient complains only of the following symptoms, and all other reviewed systems are negative.  See HPI  ALLERGIES: No Known Allergies  HOME MEDICATIONS: Outpatient Medications Prior to Visit  Medication Sig Dispense Refill   Fingolimod HCl 0.5 MG CAPS TAKE 1 CAPSULE BY MOUTH 1 TIME A DAY 90 capsule 1   gabapentin (NEURONTIN) 400 MG capsule Take 1 capsule (400 mg total) by mouth 3 (three) times daily as needed. 90 capsule 5   Prenatal Vit-Fe  Fumarate-FA (PRENATAL VITAMIN) 27-0.8 MG TABS Take 1 tablet by mouth daily. 30 tablet 12   Facility-Administered Medications Prior to Visit  Medication Dose Route Frequency Provider Last Rate Last Admin   gadopentetate dimeglumine (MAGNEVIST) injection 20 mL  20 mL Intravenous Once PRN Butch Penny, NP        PAST MEDICAL HISTORY: Past Medical History:  Diagnosis Date   Anemia    Low back pain 01/20/2013   Multiple sclerosis (HCC) 01/12/2013   Trichomoniasis     PAST SURGICAL HISTORY: Past Surgical History:  Procedure Laterality Date   CESAREAN SECTION N/A 12/28/2021   Procedure: CESAREAN SECTION;  Surgeon: Carlisle Cater, MD;  Location: [facility] LD ORS;  Service: Obstetrics;  Laterality: N/A;   LAPAROSCOPY Right 02/28/2021   Procedure: LAPAROSCOPY DIAGNOSTIC;  Surgeon: Charlett Nose, MD;  Location: [facility];  Service: Gynecology;  Laterality: Right;    FAMILY HISTORY: Family History  Problem Relation Age of Onset   Healthy Mother    Hypertension Maternal Grandmother    Diabetes Maternal Grandmother    Heart failure Paternal Grandmother    Heart disease Paternal Grandmother    Hypertension Paternal Grandmother     SOCIAL HISTORY: Social History   Socioeconomic History   Marital status: Married    Spouse name: Not on file  Number of children: 1   Years of education: 12   Highest education level: Not on file  Occupational History    Comment: [employer]  Tobacco Use   Smoking status: Never   Smokeless tobacco: Never  Vaping Use   Vaping Use: Never used  Substance and Sexual Activity   Alcohol use: No   Drug use: Never   Sexual activity: Yes    Partners: Male    Birth control/protection: None  Other Topics Concern   Not on file  Social History Narrative   Patient is working at [employer] care Full time., lives with her husband, just had her first baby, she is 63 months old   Patient is right handed.   Patient has a high school education and some  college.   Patient does not drink caffeine.   Social Determinants of Health   Financial Resource Strain: Not on file  Food Insecurity: No Food Insecurity (12/20/2021)   Hunger Vital Sign    Worried About Running Out of Food in the Last Year: Never true    Ran Out of Food in the Last Year: Never true  Transportation Needs: Not on file  Physical Activity: Not on file  Stress: Not on file  Social Connections: Not on file  Intimate Partner Violence: Not on file   PHYSICAL EXAM  There were no vitals filed for this visit.  There is no height or weight on file to calculate BMI.  Generalized: Well developed, in no acute distress    PHYSICAL EXAMNIATION:  Gen: NAD, conversant, well nourised, well groomed                     Cardiovascular: Regular rate rhythm, no peripheral edema, warm, nontender. Eyes: Conjunctivae clear without exudates or hemorrhage Neck: Supple, no carotid bruits. Pulmonary: Clear to auscultation bilaterally   NEUROLOGICAL EXAM:  MENTAL STATUS: Speech/cognition: Awake, alert, oriented to history taking and casual conversation   CRANIAL NERVES: CN II: Visual fields are full to confrontation.  Pupils are round equal and briskly reactive to light. CN III, IV, VI: extraocular movement are normal. No ptosis. CN V: Facial sensation is intact to pinprick in all 3 divisions bilaterally. Corneal responses are intact.  CN VII: Face is symmetric with normal eye closure and smile. CN VIII: Hearing is normal to casual conversation CN IX, X: Palate elevates symmetrically. Phonation is normal. CN XI: Head turning and shoulder shrug are intact  MOTOR: There is no pronator drift of out-stretched arms. Muscle bulk and tone are normal. Muscle strength is normal.  REFLEXES: Reflexes are 2+ and symmetric at the biceps, triceps, knees, and ankles. Plantar responses are flexor.  SENSORY: Intact to light touch, pinprick, positional and vibratory sensation are intact in  fingers and toes.  COORDINATION: Rapid alternating movements and fine finger movements are intact. There is no dysmetria on finger-to-nose and heel-knee-shin.    GAIT/STANCE: Posture is normal. Gait is steady   DIAGNOSTIC DATA (LABS, IMAGING, TESTING) - I reviewed patient records, labs, notes, testing and imaging myself where available.  Lab Results  Component Value Date   WBC 20.7 (H) 12/29/2021   HGB 10.1 (L) 12/29/2021   HCT 29.4 (L) 12/29/2021   MCV 77.4 (L) 12/29/2021   PLT 158 12/29/2021      Component Value Date/Time   NA 133 (L) 12/29/2021 0410   NA 143 08/02/2020 1417   K 4.2 12/29/2021 0410   K 3.7 04/02/2017 1256  CL 102 12/29/2021 0410   CL 103 04/02/2017 1256   CO2 23 12/29/2021 0410   CO2 29 04/02/2017 1256   GLUCOSE 130 (H) 12/29/2021 0410   BUN 9 12/29/2021 0410   BUN 7 08/02/2020 1417   CREATININE 0.76 12/29/2021 0410   CREATININE 0.71 04/02/2017 1256   CALCIUM 7.4 (L) 12/29/2021 0410   CALCIUM 9.6 04/02/2017 1256   PROT 5.3 (L) 12/29/2021 0410   PROT 6.3 08/02/2020 1417   ALBUMIN 2.5 (L) 12/29/2021 0410   ALBUMIN 4.1 08/02/2020 1417   ALBUMIN 4.2 04/02/2017 1256   AST 238 (H) 12/29/2021 0410   AST 18 04/02/2017 1256   ALT 358 (H) 12/29/2021 0410   ALT 20 04/02/2017 1256   ALKPHOS 116 12/29/2021 0410   ALKPHOS 92 04/02/2017 1256   BILITOT 0.3 12/29/2021 0410   BILITOT 0.3 08/02/2020 1417   GFRNONAA >60 12/29/2021 0410   GFRAA 126 08/02/2020 1417   Lab Results  Component Value Date   CHOL 136 08/02/2020   HDL 47 08/02/2020   LDLCALC 77 08/02/2020   TRIG 58 08/02/2020   CHOLHDL 3.2 03/03/2017   Lab Results  Component Value Date   HGBA1C 5.2 03/03/2017   Lab Results  Component Value Date   VITAMINB12 749 04/02/2017   Lab Results  Component Value Date   TSH 1.160 08/02/2020    ASSESSMENT AND PLAN 36 y.o. year old female   Relapsing remitting multiple sclerosis Postpartum, selective C-section on December 28, 2021  Most recent MRI  of of brain and cervical spine with and without contrast in May 2023, showed no enhancing lesion, evidence of spinal cord involvement Previously patient was evaluated for Mayzent treatment, now worried about the potential side effect, she adamantly want to proceed with Gilenya, which she has been familiar with,  Referred to cardiologist, first dose observation will be done at cardiologist office  ,

## 2022-10-07 ENCOUNTER — Telehealth: Payer: Self-pay | Admitting: Neurology

## 2022-10-07 ENCOUNTER — Ambulatory Visit: Payer: No Typology Code available for payment source | Admitting: Neurology

## 2022-10-07 NOTE — Telephone Encounter (Signed)
Pt called. Stated she ha a family emergency and needed to cancel appointment for today.

## 2022-10-07 NOTE — Progress Notes (Deleted)
PRIMARY NEUROLOGIST: Dr.Yan  Carol [name] is a 36 year old female with a history of relapse remitting multiple sclerosis.    She was enrolled in [place], randomized to Gilenya, since March 5th 2014.    In March 2013, she developed new-onset right eye pain and blurred vision. She was initially diagnosed with corneal abrasion. She went to the emergency room twice for this. She then followed up with ophthalmology, who ordered MRI of the brain and orbits. This showed acute right optic neuritis and 7-8 chronic demyelinating plaques. She was diagnosed with probable multiple sclerosis    Around beginning of December 2013, patient developed new symptoms of numbness in her bilateral fingertips and bilateral toes, lasted for 2 weeks.   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 Patient also has intermittent muscle spasms and balance difficulty. No family history of MS.   MRI brain showed approximately six lesions in the brain which are consistent with multiple sclerosis. there is abnormality in the right optic nerve consistent with  multiple sclerosis, see below report.  No enhancing lesions are identified.   MRI ORBITS in April 2013: Mild swelling of hyperintensity in the right optic nerve is present.    Showed abnormal  enhancement with slight stranding in the surrounding orbital fat suggesting acute inflammation.  Enhancement is most prominent within the optic canal on the right.  Optic chiasm is normal. The left optic nerve is normal.     She remains on Gilenya and is tolerating it well. She denies any new numbness or weakness. She continues to report numbness in the hands and lower extremity. She states occasionally she'll have burning and tingling pain in the right leg. The symptoms are intermittent and has occurred before. She denies any changes with her gait or balance. Denies any changes with her  vision. No change in her mood or behavior. Her last MRI of the brain was in 2017. At that time he did show 2 new lesions however the patient had been off her Gilenya for 5 months. The patient also sees a hematologist for anemia. She denies any new neurological symptoms. She returns today for an evaluation.   Update 03/16/2019 YY: There was no significant worsening of her MS symptoms, she continue have intermittent bilateral upper and lower extremity paresthesia, taking gabapentin 400 mg 3 times a day,   She denies visual loss, no significant gait abnormality, has been taking Gilenya since 2014, tolerating it well,   I personally reviewed MRI brain in Oct 2018: Multiple round and ovoid periventricular and subcortical foci of chronic demyelinating plaque, no change compared to scan in November 2019   She presented to emergency room on January 07, 2019 for fever, 104, whole body achy pain, cough, chest x-ray showed low lung volumes with bibasilar atelectasis, infiltrates, patient symptom last for 1 week, now improved, per patient, COVID-19 RNA testing was negative, but I do not find the report   Laboratory evaluations showed hemoglobin of 12.6, MCV of 75.4, platelet was 132, BMP showed potassium of 3.3, glucose was elevated 115,   UPDATE June 04 2021: She stopped Gilenya about a week ago, reported home testing was positive twice for pregnancy, it seems to be the similar story in May 2022, hCG beta chain was strongly positive then, pregnancy was terminated   She denies any new flareups of MS symptoms, overall doing well, working from home  Update December 05, 2021 SS: Is  pregnant [redacted] weeks 5 days, due date is February 15, 2022. Having a girl. Pregnancy is going well so far, no issues.  On prenatal vitamin.  Denies any MS symptoms.  At times, may have tingling/numbness to her hands intermittently.  Does not plan to breast-feed.  Remains off Gilenya.  UPDATE February 05 2022: She stop Gilenya in August 2022,  had a scheduled C-section on March 10th 2023, due to breech presentation, preeclampsia, with normal healthy baby girl  She denied new neurological symptoms, has intermittent bilateral hands paresthesia, no gait abnormality, She was very happy with the result of Gilenya, wants to go back on it, no longer breast-feeding  Update March 21, 2022 She denies any new neurological symptoms, previously we went over Mayzent related information in detail, she also went through [employer] sponsored premedication evaluation, including EKG, ophthalmology evaluation, but patient stated, after she was contacted by company representative, she becomes so worried about the potential side effect of her medications, adamantly decided to go back on Gilenya, which she has been taking for many years, familiar with it, understands she needs to be evaluated, and go through in-office first dose observation  Reviewed repeat MRI of the brain and cervical spine with without contrast on Feb 25, 2022, multiple periventricular, subcortical and brainstem chronical demyelinating disease, no contrast-enhancement, no change compared to MRI in October 2020, MRI of cervical spine showed ill-defined spinal cord hyperintensity throughout cervical region most prominent at C2, C3-4, and C5 no contrast-enhancement  Laboratory evaluation 2023 normal CBC, CMP  Update October 07, 2022 SS: She was restarted on Gilenya, FDO was 05/03/22  REVIEW OF SYSTEMS: Out of a complete 14 system review of symptoms, the patient complains only of the following symptoms, and all other reviewed systems are negative.  See HPI  ALLERGIES: No Known Allergies  HOME MEDICATIONS: Outpatient Medications Prior to Visit  Medication Sig Dispense Refill   Fingolimod HCl 0.5 MG CAPS TAKE 1 CAPSULE BY MOUTH 1 TIME A DAY 90 capsule 1   gabapentin (NEURONTIN) 400 MG capsule Take 1 capsule (400 mg total) by mouth 3 (three) times daily as needed. 90 capsule 5   Prenatal Vit-Fe  Fumarate-FA (PRENATAL VITAMIN) 27-0.8 MG TABS Take 1 tablet by mouth daily. 30 tablet 12   Facility-Administered Medications Prior to Visit  Medication Dose Route Frequency Provider Last Rate Last Admin   gadopentetate dimeglumine (MAGNEVIST) injection 20 mL  20 mL Intravenous Once PRN Butch Penny, NP        PAST MEDICAL HISTORY: Past Medical History:  Diagnosis Date   Anemia    Low back pain 01/20/2013   Multiple sclerosis (HCC) 01/12/2013   Trichomoniasis     PAST SURGICAL HISTORY: Past Surgical History:  Procedure Laterality Date   CESAREAN SECTION N/A 12/28/2021   Procedure: CESAREAN SECTION;  Surgeon: Carlisle Cater, MD;  Location: [facility] ORS;  Service: Obstetrics;  Laterality: N/A;   LAPAROSCOPY Right 02/28/2021   Procedure: LAPAROSCOPY DIAGNOSTIC;  Surgeon: Charlett Nose, MD;  Location: [facility] OR;  Service: Gynecology;  Laterality: Right;    FAMILY HISTORY: Family History  Problem Relation Age of Onset   Healthy Mother    Hypertension Maternal Grandmother    Diabetes Maternal Grandmother    Heart failure Paternal Grandmother    Heart disease Paternal Grandmother    Hypertension Paternal Grandmother     SOCIAL HISTORY: Social History   Socioeconomic History   Marital status: Married    Spouse name: Not on file  Number of children: 1   Years of education: 12   Highest education level: Not on file  Occupational History    Comment: [employer]  Tobacco Use   Smoking status: Never   Smokeless tobacco: Never  Vaping Use   Vaping Use: Never used  Substance and Sexual Activity   Alcohol use: No   Drug use: Never   Sexual activity: Yes    Partners: Male    Birth control/protection: None  Other Topics Concern   Not on file  Social History Narrative   Patient is working at [employer] care Full time., lives with her husband, just had her first baby, she is 61 months old   Patient is right handed.   Patient has a high school education and some  college.   Patient does not drink caffeine.   Social Determinants of Health   Financial Resource Strain: Not on file  Food Insecurity: No Food Insecurity (12/20/2021)   Hunger Vital Sign    Worried About Running Out of Food in the Last Year: Never true    Ran Out of Food in the Last Year: Never true  Transportation Needs: Not on file  Physical Activity: Not on file  Stress: Not on file  Social Connections: Not on file  Intimate Partner Violence: Not on file   PHYSICAL EXAM  There were no vitals filed for this visit.  There is no height or weight on file to calculate BMI.  Generalized: Well developed, in no acute distress    PHYSICAL EXAMNIATION:  Gen: NAD, conversant, well nourised, well groomed                     Cardiovascular: Regular rate rhythm, no peripheral edema, warm, nontender. Eyes: Conjunctivae clear without exudates or hemorrhage Neck: Supple, no carotid bruits. Pulmonary: Clear to auscultation bilaterally   NEUROLOGICAL EXAM:  MENTAL STATUS: Speech/cognition: Awake, alert, oriented to history taking and casual conversation   CRANIAL NERVES: CN II: Visual fields are full to confrontation.  Pupils are round equal and briskly reactive to light. CN III, IV, VI: extraocular movement are normal. No ptosis. CN V: Facial sensation is intact to pinprick in all 3 divisions bilaterally. Corneal responses are intact.  CN VII: Face is symmetric with normal eye closure and smile. CN VIII: Hearing is normal to casual conversation CN IX, X: Palate elevates symmetrically. Phonation is normal. CN XI: Head turning and shoulder shrug are intact  MOTOR: There is no pronator drift of out-stretched arms. Muscle bulk and tone are normal. Muscle strength is normal.  REFLEXES: Reflexes are 2+ and symmetric at the biceps, triceps, knees, and ankles. Plantar responses are flexor.  SENSORY: Intact to light touch, pinprick, positional and vibratory sensation are intact in  fingers and toes.  COORDINATION: Rapid alternating movements and fine finger movements are intact. There is no dysmetria on finger-to-nose and heel-knee-shin.    GAIT/STANCE: Posture is normal. Gait is steady   DIAGNOSTIC DATA (LABS, IMAGING, TESTING) - I reviewed patient records, labs, notes, testing and imaging myself where available.  Lab Results  Component Value Date   WBC 20.7 (H) 12/29/2021   HGB 10.1 (L) 12/29/2021   HCT 29.4 (L) 12/29/2021   MCV 77.4 (L) 12/29/2021   PLT 158 12/29/2021      Component Value Date/Time   NA 133 (L) 12/29/2021 0410   NA 143 08/02/2020 1417   K 4.2 12/29/2021 0410   K 3.7 04/02/2017 1256  CL 102 12/29/2021 0410   CL 103 04/02/2017 1256   CO2 23 12/29/2021 0410   CO2 29 04/02/2017 1256   GLUCOSE 130 (H) 12/29/2021 0410   BUN 9 12/29/2021 0410   BUN 7 08/02/2020 1417   CREATININE 0.76 12/29/2021 0410   CREATININE 0.71 04/02/2017 1256   CALCIUM 7.4 (L) 12/29/2021 0410   CALCIUM 9.6 04/02/2017 1256   PROT 5.3 (L) 12/29/2021 0410   PROT 6.3 08/02/2020 1417   ALBUMIN 2.5 (L) 12/29/2021 0410   ALBUMIN 4.1 08/02/2020 1417   ALBUMIN 4.2 04/02/2017 1256   AST 238 (H) 12/29/2021 0410   AST 18 04/02/2017 1256   ALT 358 (H) 12/29/2021 0410   ALT 20 04/02/2017 1256   ALKPHOS 116 12/29/2021 0410   ALKPHOS 92 04/02/2017 1256   BILITOT 0.3 12/29/2021 0410   BILITOT 0.3 08/02/2020 1417   GFRNONAA >60 12/29/2021 0410   GFRAA 126 08/02/2020 1417   Lab Results  Component Value Date   CHOL 136 08/02/2020   HDL 47 08/02/2020   LDLCALC 77 08/02/2020   TRIG 58 08/02/2020   CHOLHDL 3.2 03/03/2017   Lab Results  Component Value Date   HGBA1C 5.2 03/03/2017   Lab Results  Component Value Date   VITAMINB12 749 04/02/2017   Lab Results  Component Value Date   TSH 1.160 08/02/2020    ASSESSMENT AND PLAN 36 y.o. year old female   Relapsing remitting multiple sclerosis Postpartum, selective C-section on December 28, 2021  Most recent MRI  of of brain and cervical spine with and without contrast in May 2023, showed no enhancing lesion, evidence of spinal cord involvement Previously patient was evaluated for Mayzent treatment, now worried about the potential side effect, she adamantly want to proceed with Gilenya, which she has been familiar with,  Referred to cardiologist, first dose observation will be done at cardiologist office  ,

## 2023-02-19 ENCOUNTER — Other Ambulatory Visit: Payer: Self-pay | Admitting: Neurology

## 2023-03-20 ENCOUNTER — Other Ambulatory Visit (HOSPITAL_COMMUNITY): Payer: Self-pay

## 2023-03-20 ENCOUNTER — Telehealth: Payer: Self-pay

## 2023-03-20 NOTE — Telephone Encounter (Signed)
Pharmacy Patient Advocate Encounter   Received notification from [facility] that prior authorization for Fingolimod HCl 0.5MG  capsules is required/requested.   PA submitted on 03/20/2023 to (ins) [facility] via CoverMyMeds Key or (Medicaid) confirmation # [id number] Status is pending

## 2023-03-22 ENCOUNTER — Other Ambulatory Visit (HOSPITAL_COMMUNITY): Payer: Self-pay

## 2023-03-22 NOTE — Telephone Encounter (Signed)
CMM states PA has been resolved. No further action needed.

## 2023-03-29 ENCOUNTER — Other Ambulatory Visit: Payer: Self-pay | Admitting: Neurology

## 2023-04-09 ENCOUNTER — Other Ambulatory Visit (HOSPITAL_COMMUNITY): Payer: Self-pay

## 2023-04-09 ENCOUNTER — Telehealth: Payer: Self-pay

## 2023-04-09 NOTE — Telephone Encounter (Signed)
Pharmacy Patient Advocate Encounter  Prior Authorization for Fingolimod HCl 0.5MG  capsules has been APPROVED by [facility] from 04/09/2023 to 04/08/2024.   PA # PA Case ID #: [id number]

## 2023-04-09 NOTE — Telephone Encounter (Signed)
Pharmacy Patient Advocate Encounter   Received notification from [employer] that prior authorization for Fingolimod HCl 0.5MG  capsules is required/requested.   PA submitted to [facility] via CoverMyMeds Key or (Medicaid) confirmation # [id number] Status is pending

## 2023-05-19 ENCOUNTER — Telehealth: Payer: Self-pay | Admitting: Neurology

## 2023-05-19 ENCOUNTER — Other Ambulatory Visit: Payer: Self-pay

## 2023-05-19 MED ORDER — GABAPENTIN 400 MG PO CAPS: 400.0000 mg | ORAL_CAPSULE | Freq: Three times a day (TID) | ORAL | 0 refills | Status: DC | PRN

## 2023-05-19 NOTE — Telephone Encounter (Signed)
Pt is needing a refill request for her  gabapentin (NEURONTIN) 400 MG capsule [facility] [place]. Pt states that she only has one left. Pt is scheduled to see the NP mid Aug.

## 2023-06-11 NOTE — Progress Notes (Unsigned)
Patient: Carol [name] Date of Birth: Jun 16, 1986  Reason for Visit: Follow up History from: Patient Primary Neurologist: Terrace Arabia  ASSESSMENT AND PLAN 37 y.o. year old female   1.  Relapsing remitting multiple sclerosis 2.  C-section March 2023, baby girl 3.  Paresthesia bilateral hands, feet  -Doing very well on generic Gilenya (FDO 05/03/22) -Check CBC, CMP -Continue gabapentin for paresthesias -Repeat MRI of the brain and cervical spine within the next year (last to May 2023 showing good brain stability of MS lesions, cervical lesions C2, C3-4, C5) -Follow-up in 6 months or sooner if needed  HISTORY  Ms. Hamblett is a 37 year old female with a history of relapse remitting multiple sclerosis.    She was enrolled in [facility], randomized to Gilenya, since March 5th 2014.    In March 2013, she developed new-onset right eye pain and blurred vision. She was initially diagnosed with corneal abrasion. She went to the emergency room twice for this. She then followed up with ophthalmology, who ordered MRI of the brain and orbits. This showed acute right optic neuritis and 7-8 chronic demyelinating plaques. She was diagnosed with probable multiple sclerosis    Around beginning of December 2013, patient developed new symptoms of numbness in her bilateral fingertips and bilateral toes, lasted for 2 weeks.   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 Patient also has intermittent muscle spasms and balance difficulty. No family history of MS.   MRI brain showed approximately six lesions in the brain which are consistent with multiple sclerosis. there is abnormality in the right optic nerve consistent with  multiple sclerosis, see below report.  No enhancing lesions are identified.   MRI ORBITS in April 2013: Mild swelling of hyperintensity in the right optic nerve is present.    Showed abnormal   enhancement with slight stranding in the surrounding orbital fat suggesting acute inflammation.  Enhancement is most prominent within the optic canal on the right.  Optic chiasm is normal. The left optic nerve is normal.     She remains on Gilenya and is tolerating it well. She denies any new numbness or weakness. She continues to report numbness in the hands and lower extremity. She states occasionally she'll have burning and tingling pain in the right leg. The symptoms are intermittent and has occurred before. She denies any changes with her gait or balance. Denies any changes with her vision. No change in her mood or behavior. Her last MRI of the brain was in 2017. At that time he did show 2 new lesions however the patient had been off her Gilenya for 5 months. The patient also sees a hematologist for anemia. She denies any new neurological symptoms. She returns today for an evaluation.   Update 03/16/2019 YY: There was no significant worsening of her MS symptoms, she continue have intermittent bilateral upper and lower extremity paresthesia, taking gabapentin 400 mg 3 times a day,   She denies visual loss, no significant gait abnormality, has been taking Gilenya since 2014, tolerating it well,   I personally reviewed MRI brain in Oct 2018: Multiple round and ovoid periventricular and subcortical foci of chronic demyelinating plaque, no change compared to scan in November 2019   She presented to emergency room on January 07, 2019 for fever, 104, whole body achy pain, cough, chest x-ray showed low lung volumes with bibasilar atelectasis, infiltrates, patient symptom last for 1 week, now improved, per  patient, COVID-19 RNA testing was negative, but I do not find the report   Laboratory evaluations showed hemoglobin of 12.6, MCV of 75.4, platelet was 132, BMP showed potassium of 3.3, glucose was elevated 115,   UPDATE June 04 2021: She stopped Gilenya about a week ago, reported home testing was  positive twice for pregnancy, it seems to be the similar story in May 2022, hCG beta chain was strongly positive then, pregnancy was terminated   She denies any new flareups of MS symptoms, overall doing well, working from home   Update December 05, 2021 SS: Is pregnant [redacted] weeks 5 days, due date is February 15, 2022. Having a girl. Pregnancy is going well so far, no issues.  On prenatal vitamin.  Denies any MS symptoms.  At times, may have tingling/numbness to her hands intermittently.  Does not plan to breast-feed.  Remains off Gilenya.   UPDATE February 05 2022: She stop Gilenya in August 2022, had a scheduled C-section on March 10th 2023, due to breech presentation, preeclampsia, with normal healthy baby girl   She denied new neurological symptoms, has intermittent bilateral hands paresthesia, no gait abnormality, She was very happy with the result of Gilenya, wants to go back on it, no longer breast-feeding   Update March 21, 2022 She denies any new neurological symptoms, previously we went over [facility] related information in detail, she also went through [employer] sponsored premedication evaluation, including EKG, ophthalmology evaluation, but patient stated, after she was contacted by company representative, she becomes so worried about the potential side effect of her medications, adamantly decided to go back on Gilenya, which she has been taking for many years, familiar with it, understands she needs to be evaluated, and go through in-office first dose observation   Reviewed repeat MRI of the brain and cervical spine with without contrast on Feb 25, 2022, multiple periventricular, subcortical and brainstem chronical demyelinating disease, no contrast-enhancement, no change compared to MRI in October 2020, MRI of cervical spine showed ill-defined spinal cord hyperintensity throughout cervical region most prominent at C2, C3-4, and C5 no contrast-enhancement   Laboratory evaluation 2023 normal CBC,  CMP  Update June 12, 2023 SS: Restarted Gilenya with FDO 05/03/22. Remains on generic gilenya, cost is affordable, no issues getting it. MS doing well. No MS symptoms, vision is fine, arms and legs normal, no falls, gait is good. Baby girl is 18 months. Has IUD. Gabapentin 400 mg TID, takes for paresthesia in extremities with excellent benefit. Eye exam annually.   REVIEW OF SYSTEMS: Out of a complete 14 system review of symptoms, the patient complains only of the following symptoms, and all other reviewed systems are negative.  See HPI  ALLERGIES: No Known Allergies  HOME MEDICATIONS: Outpatient Medications Prior to Visit  Medication Sig Dispense Refill   Fingolimod HCl 0.5 MG CAPS TAKE 1 CAPSULE BY MOUTH 1 TIME A DAY 90 capsule 1   gabapentin (NEURONTIN) 400 MG capsule Take 1 capsule (400 mg total) by mouth 3 (three) times daily as needed. 90 capsule 0   Prenatal Vit-Fe Fumarate-FA (PRENATAL VITAMIN) 27-0.8 MG TABS Take 1 tablet by mouth daily. 30 tablet 12   Facility-Administered Medications Prior to Visit  Medication Dose Route Frequency Provider Last Rate Last Admin   gadopentetate dimeglumine (MAGNEVIST) injection 20 mL  20 mL Intravenous Once PRN Butch Penny, NP        PAST MEDICAL HISTORY: Past Medical History:  Diagnosis Date   Anemia    Low back  pain 01/20/2013   Multiple sclerosis (HCC) 01/12/2013   Trichomoniasis     PAST SURGICAL HISTORY: Past Surgical History:  Procedure Laterality Date   CESAREAN SECTION N/A 12/28/2021   Procedure: CESAREAN SECTION;  Surgeon: Carlisle Cater, MD;  Location: [facility] ORS;  Service: Obstetrics;  Laterality: N/A;   LAPAROSCOPY Right 02/28/2021   Procedure: LAPAROSCOPY DIAGNOSTIC;  Surgeon: Charlett Nose, MD;  Location: [facility];  Service: Gynecology;  Laterality: Right;    FAMILY HISTORY: Family History  Problem Relation Age of Onset   Healthy Mother    Hypertension Maternal Grandmother    Diabetes Maternal Grandmother     Heart failure Paternal Grandmother    Heart disease Paternal Grandmother    Hypertension Paternal Grandmother     SOCIAL HISTORY: Social History   Socioeconomic History   Marital status: Married    Spouse name: Not on file   Number of children: 1   Years of education: 12   Highest education level: Not on file  Occupational History    Comment: [employer]  Tobacco Use   Smoking status: Never   Smokeless tobacco: Never  Vaping Use   Vaping status: Never Used  Substance and Sexual Activity   Alcohol use: No   Drug use: Never   Sexual activity: Yes    Partners: Male    Birth control/protection: None  Other Topics Concern   Not on file  Social History Narrative   Patient is working at [employer] care Full time., lives with her husband, just had her first baby, she is 3 months old   Patient is right handed.   Patient has a high school education and some college.   Patient does not drink caffeine.   Social Determinants of Health   Financial Resource Strain: Not on file  Food Insecurity: No Food Insecurity (12/20/2021)   Hunger Vital Sign    Worried About Running Out of Food in the Last Year: Never true    Ran Out of Food in the Last Year: Never true  Transportation Needs: Not on file  Physical Activity: Not on file  Stress: Not on file  Social Connections: Not on file  Intimate Partner Violence: Not on file    PHYSICAL EXAM  Vitals:   06/12/23 0802  BP: 127/87  Pulse: 72  Weight: 222 lb 4.8 oz (100.8 kg)  Height: 5\' 11"  (1.803 m)   Body mass index is 31 kg/m.  Generalized: Well developed, in no acute distress  Neurological examination  Mentation: Alert oriented to time, place, history taking. Follows all commands speech and language fluent Cranial nerve II-XII: Pupils were equal round reactive to light. Extraocular movements were full, visual field were full on confrontational test. Facial sensation and strength were normal.  Head turning and shoulder  shrug  were normal and symmetric. Motor: The motor testing reveals 5 over 5 strength of all 4 extremities. Good symmetric motor tone is noted throughout.  Sensory: Sensory testing is intact to soft touch on all 4 extremities. No evidence of extinction is noted.  Coordination: Cerebellar testing reveals good finger-nose-finger and heel-to-shin bilaterally.  Gait and station: Gait is normal.  Reflexes: Deep tendon reflexes are symmetric and normal bilaterally.   DIAGNOSTIC DATA (LABS, IMAGING, TESTING) - I reviewed patient records, labs, notes, testing and imaging myself where available.  Lab Results  Component Value Date   WBC 20.7 (H) 12/29/2021   HGB 10.1 (L) 12/29/2021   HCT 29.4 (L) 12/29/2021  MCV 77.4 (L) 12/29/2021   PLT 158 12/29/2021      Component Value Date/Time   NA 133 (L) 12/29/2021 0410   NA 143 08/02/2020 1417   K 4.2 12/29/2021 0410   K 3.7 04/02/2017 1256   CL 102 12/29/2021 0410   CL 103 04/02/2017 1256   CO2 23 12/29/2021 0410   CO2 29 04/02/2017 1256   GLUCOSE 130 (H) 12/29/2021 0410   BUN 9 12/29/2021 0410   BUN 7 08/02/2020 1417   CREATININE 0.76 12/29/2021 0410   CREATININE 0.71 04/02/2017 1256   CALCIUM 7.4 (L) 12/29/2021 0410   CALCIUM 9.6 04/02/2017 1256   PROT 5.3 (L) 12/29/2021 0410   PROT 6.3 08/02/2020 1417   ALBUMIN 2.5 (L) 12/29/2021 0410   ALBUMIN 4.1 08/02/2020 1417   ALBUMIN 4.2 04/02/2017 1256   AST 238 (H) 12/29/2021 0410   AST 18 04/02/2017 1256   ALT 358 (H) 12/29/2021 0410   ALT 20 04/02/2017 1256   ALKPHOS 116 12/29/2021 0410   ALKPHOS 92 04/02/2017 1256   BILITOT 0.3 12/29/2021 0410   BILITOT 0.3 08/02/2020 1417   GFRNONAA >60 12/29/2021 0410   GFRAA 126 08/02/2020 1417   Lab Results  Component Value Date   CHOL 136 08/02/2020   HDL 47 08/02/2020   LDLCALC 77 08/02/2020   TRIG 58 08/02/2020   CHOLHDL 3.2 03/03/2017   Lab Results  Component Value Date   HGBA1C 5.2 03/03/2017   Lab Results  Component Value Date    VITAMINB12 749 04/02/2017   Lab Results  Component Value Date   TSH 1.160 08/02/2020    Margie Ege, AGNP-C, DNP 06/12/2023, 8:39 AM [facility] [address] [place], [place] [address] [phone]

## 2023-06-12 ENCOUNTER — Ambulatory Visit (INDEPENDENT_AMBULATORY_CARE_PROVIDER_SITE_OTHER): Payer: No Typology Code available for payment source | Admitting: Neurology

## 2023-06-12 VITALS — BP 127/87 | HR 72 | Ht 71.0 in | Wt 222.3 lb

## 2023-06-12 DIAGNOSIS — R202 Paresthesia of skin: Secondary | ICD-10-CM | POA: Diagnosis not present

## 2023-06-12 DIAGNOSIS — R200 Anesthesia of skin: Secondary | ICD-10-CM | POA: Diagnosis not present

## 2023-06-12 DIAGNOSIS — G35 Multiple sclerosis: Secondary | ICD-10-CM

## 2023-06-12 MED ORDER — FINGOLIMOD HCL 0.5 MG PO CAPS: 0.5000 mg | ORAL_CAPSULE | Freq: Every day | ORAL | 3 refills | Status: DC

## 2023-06-12 MED ORDER — GABAPENTIN 400 MG PO CAPS: 400.0000 mg | ORAL_CAPSULE | Freq: Three times a day (TID) | ORAL | 11 refills | Status: DC | PRN

## 2023-06-12 NOTE — Patient Instructions (Signed)
Great to see you today.  We will continue generic Gilenya.  Check labs today.  Continue gabapentin.  Make sure you see your eye doctor annually.  See back in 6 months.  Thanks!!

## 2023-06-13 LAB — CBC WITH DIFFERENTIAL/PLATELET
Basophils Absolute: 0 10*3/uL (ref 0.0–0.2)
Basos: 0 %
EOS (ABSOLUTE): 0.2 10*3/uL (ref 0.0–0.4)
Eos: 2 %
Hematocrit: 41 % (ref 34.0–46.6)
Hemoglobin: 13.8 g/dL (ref 11.1–15.9)
Immature Grans (Abs): 0 10*3/uL (ref 0.0–0.1)
Immature Granulocytes: 0 %
Lymphocytes Absolute: 0.8 10*3/uL (ref 0.7–3.1)
Lymphs: 12 %
MCH: 26 pg — ABNORMAL LOW (ref 26.6–33.0)
MCHC: 33.7 g/dL (ref 31.5–35.7)
MCV: 77 fL — ABNORMAL LOW (ref 79–97)
Monocytes Absolute: 0.6 10*3/uL (ref 0.1–0.9)
Monocytes: 10 %
Neutrophils Absolute: 4.8 10*3/uL (ref 1.4–7.0)
Neutrophils: 76 %
Platelets: 183 10*3/uL (ref 150–450)
RBC: 5.31 x10E6/uL — ABNORMAL HIGH (ref 3.77–5.28)
RDW: 13.9 % (ref 11.7–15.4)
WBC: 6.3 10*3/uL (ref 3.4–10.8)

## 2023-06-13 LAB — COMPREHENSIVE METABOLIC PANEL
ALT: 44 IU/L — ABNORMAL HIGH (ref 0–32)
AST: 35 IU/L (ref 0–40)
Albumin: 4.2 g/dL (ref 3.9–4.9)
Alkaline Phosphatase: 157 IU/L — ABNORMAL HIGH (ref 44–121)
BUN/Creatinine Ratio: 13 (ref 9–23)
BUN: 10 mg/dL (ref 6–20)
Bilirubin Total: 0.5 mg/dL (ref 0.0–1.2)
CO2: 27 mmol/L (ref 20–29)
Calcium: 9.4 mg/dL (ref 8.7–10.2)
Chloride: 104 mmol/L (ref 96–106)
Creatinine, Ser: 0.77 mg/dL (ref 0.57–1.00)
Globulin, Total: 2.7 g/dL (ref 1.5–4.5)
Glucose: 97 mg/dL (ref 70–99)
Potassium: 4.4 mmol/L (ref 3.5–5.2)
Sodium: 143 mmol/L (ref 134–144)
Total Protein: 6.9 g/dL (ref 6.0–8.5)
eGFR: 102 mL/min/{1.73_m2} (ref >59)

## 2023-06-15 ENCOUNTER — Encounter: Payer: Self-pay | Admitting: Neurology

## 2023-07-21 ENCOUNTER — Telehealth: Payer: Self-pay | Admitting: Neurology

## 2023-07-21 DIAGNOSIS — G35 Multiple sclerosis: Secondary | ICD-10-CM

## 2023-07-21 NOTE — Telephone Encounter (Signed)
I placed order for repeat CMP, in Aug 2024, increasing liver function on Gilenya.

## 2023-12-29 NOTE — Progress Notes (Unsigned)
 Carol [name]: Carol [name] Date of Birth: 07/21/1986  Reason for Visit: Follow up History from: Carol [name] Primary Neurologist: Terrace Arabia  ASSESSMENT AND PLAN 38 y.o. year old female   1.  Relapsing remitting multiple sclerosis 2.  C-section March 2023, baby girl 3.  Paresthesia bilateral hands, feet  -Doing very well on generic Gilenya (FDO 05/03/22) -Check CBC, CMP -Continue gabapentin for paresthesias -Repeat MRI of the brain and cervical spine within the next year (last to May 2023 showing good brain stability of MS lesions, cervical lesions C2, C3-4, C5) -Follow-up in 6 months or sooner if needed  HISTORY  Carol [name] is a 38 year old female with a history of relapse remitting multiple sclerosis.    Carol [name] was enrolled in [place], randomized to Gilenya, since March 5th 2014.    In March 2013, Carol [name] developed new-onset right eye pain and blurred vision. Carol [name] was initially diagnosed with corneal abrasion. Carol [name] went to the emergency room twice for this. Carol [name] then followed up with ophthalmology, who ordered MRI of the brain and orbits. This showed acute right optic neuritis and 7-8 chronic demyelinating plaques. Carol [name] was diagnosed with probable multiple sclerosis    Around beginning of December 2013, Carol [name] developed new symptoms of numbness in her bilateral fingertips and bilateral toes, lasted for 2 weeks.   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 Carol [name] also has intermittent muscle spasms and balance difficulty. No family history of MS.   MRI brain showed approximately six lesions in the brain which are consistent with multiple sclerosis. there is abnormality in the right optic nerve consistent with  multiple sclerosis, see below report.  No enhancing lesions are identified.   MRI ORBITS in April 2013: Mild swelling of hyperintensity in the right optic nerve is present.    Showed abnormal   enhancement with slight stranding in the surrounding orbital fat suggesting acute inflammation.  Enhancement is most prominent within the optic canal on the right.  Optic chiasm is normal. The left optic nerve is normal.     Carol [name] remains on Gilenya and is tolerating it well. Carol [name] denies any new numbness or weakness. Carol [name] continues to report numbness in the hands and lower extremity. Carol [name] states occasionally Carol [name]'ll have burning and tingling pain in the right leg. The symptoms are intermittent and has occurred before. Carol [name] denies any changes with her gait or balance. Denies any changes with her vision. No change in her mood or behavior. Her last MRI of the brain was in 2017. At that time he did show 2 new lesions however the Carol [name] had been off her Gilenya for 5 months. The Carol [name] also sees a hematologist for anemia. Carol [name] denies any new neurological symptoms. Carol [name] returns today for an evaluation.   Update 03/16/2019 YY: There was no significant worsening of her MS symptoms, Carol [name] continue have intermittent bilateral upper and lower extremity paresthesia, taking gabapentin 400 mg 3 times a day,   Carol [name] denies visual loss, no significant gait abnormality, has been taking Gilenya since 2014, tolerating it well,   I personally reviewed MRI brain in Oct 2018: Multiple round and ovoid periventricular and subcortical foci of chronic demyelinating plaque, no change compared to scan in November 2019   Carol [name] presented to emergency room on January 07, 2019 for fever, 104, whole body achy pain, cough, chest x-ray showed low lung volumes with bibasilar atelectasis, infiltrates, Carol [name] symptom last for 1 week, now improved, per  Carol [name], COVID-19 RNA testing was negative, but I do not find the report   Laboratory evaluations showed hemoglobin of 12.6, MCV of 75.4, platelet was 132, BMP showed potassium of 3.3, glucose was elevated 115,   UPDATE June 04 2021: Carol [name] stopped Gilenya about a week ago, reported home testing was  positive twice for pregnancy, it seems to be the similar story in May 2022, hCG beta chain was strongly positive then, pregnancy was terminated   Carol [name] denies any new flareups of MS symptoms, overall doing well, working from home   Update December 05, 2021 SS: Is pregnant [redacted] weeks 5 days, due date is February 15, 2022. Having a girl. Pregnancy is going well so far, no issues.  On prenatal vitamin.  Denies any MS symptoms.  At times, may have tingling/numbness to her hands intermittently.  Does not plan to breast-feed.  Remains off Gilenya.   UPDATE February 05 2022: Carol [name] stop Gilenya in August 2022, had a scheduled C-section on March 10th 2023, due to breech presentation, preeclampsia, with normal healthy baby girl   Carol [name] denied new neurological symptoms, has intermittent bilateral hands paresthesia, no gait abnormality, Carol [name] was very happy with the result of Gilenya, wants to go back on it, no longer breast-feeding   Update March 21, 2022 Carol [name] denies any new neurological symptoms, previously we went over [place] related information in detail, Carol [name] also went through [employer] sponsored premedication evaluation, including EKG, ophthalmology evaluation, but Carol [name] stated, after Carol [name] was contacted by company representative, Carol [name] becomes so worried about the potential side effect of her medications, adamantly decided to go back on Gilenya, which Carol [name] has been taking for many years, familiar with it, understands Carol [name] needs to be evaluated, and go through in-office first dose observation   Reviewed repeat MRI of the brain and cervical spine with without contrast on Feb 25, 2022, multiple periventricular, subcortical and brainstem chronical demyelinating disease, no contrast-enhancement, no change compared to MRI in October 2020, MRI of cervical spine showed ill-defined spinal cord hyperintensity throughout cervical region most prominent at C2, C3-4, and C5 no contrast-enhancement   Laboratory evaluation 2023 normal CBC,  CMP  Update June 12, 2023 SS: Restarted Gilenya with FDO 05/03/22. Remains on generic gilenya, cost is affordable, no issues getting it. MS doing well. No MS symptoms, vision is fine, arms and legs normal, no falls, gait is good. Baby girl is 18 months. Has IUD. Gabapentin 400 mg TID, takes for paresthesia in extremities with excellent benefit. Eye exam annually.   Update December 30, 2023 SS:   REVIEW OF SYSTEMS: Out of a complete 14 system review of symptoms, the Carol [name] complains only of the following symptoms, and all other reviewed systems are negative.  See HPI  ALLERGIES: No Known Allergies  HOME MEDICATIONS: Outpatient Medications Prior to Visit  Medication Sig Dispense Refill   Fingolimod HCl 0.5 MG CAPS Take 1 capsule (0.5 mg total) by mouth daily. 90 capsule 3   gabapentin (NEURONTIN) 400 MG capsule Take 1 capsule (400 mg total) by mouth 3 (three) times daily as needed. 90 capsule 11   Prenatal Vit-Fe Fumarate-FA (PRENATAL VITAMIN) 27-0.8 MG TABS Take 1 tablet by mouth daily. 30 tablet 12   Facility-Administered Medications Prior to Visit  Medication Dose Route Frequency Provider Last Rate Last Admin   gadopentetate dimeglumine (MAGNEVIST) injection 20 mL  20 mL Intravenous Once PRN Butch Penny, NP        PAST MEDICAL HISTORY: Past Medical History:  Diagnosis Date  Anemia    Low back pain 01/20/2013   Multiple sclerosis (HCC) 01/12/2013   Trichomoniasis     PAST SURGICAL HISTORY: Past Surgical History:  Procedure Laterality Date   CESAREAN SECTION N/A 12/28/2021   Procedure: CESAREAN SECTION;  Surgeon: Carlisle Cater, MD;  Location: [facility] ORS;  Service: Obstetrics;  Laterality: N/A;   LAPAROSCOPY Right 02/28/2021   Procedure: LAPAROSCOPY DIAGNOSTIC;  Surgeon: Charlett Nose, MD;  Location: [facility] OR;  Service: Gynecology;  Laterality: Right;    FAMILY HISTORY: Family History  Problem Relation Age of Onset   Healthy Mother    Hypertension Maternal  Grandmother    Diabetes Maternal Grandmother    Heart failure Paternal Grandmother    Heart disease Paternal Grandmother    Hypertension Paternal Grandmother     SOCIAL HISTORY: Social History   Socioeconomic History   Marital status: Married    Spouse name: Not on file   Number of children: 1   Years of education: 12   Highest education level: Not on file  Occupational History    Comment: [employer]  Tobacco Use   Smoking status: Never   Smokeless tobacco: Never  Vaping Use   Vaping status: Never Used  Substance and Sexual Activity   Alcohol use: No   Drug use: Never   Sexual activity: Yes    Partners: Male    Birth control/protection: None  Other Topics Concern   Not on file  Social History Narrative   Carol [name] is working at [employer] care Full time., lives with her husband, just had her first baby, Carol [name] is 73 months old   Carol [name] is right handed.   Carol [name] has a high school education and some college.   Carol [name] does not drink caffeine.   Social Drivers of Corporate investment banker Strain: Not on file  Food Insecurity: No Food Insecurity (12/20/2021)   Hunger Vital Sign    Worried About Running Out of Food in the Last Year: Never true    Ran Out of Food in the Last Year: Never true  Transportation Needs: Not on file  Physical Activity: Not on file  Stress: Not on file  Social Connections: Not on file  Intimate Partner Violence: Not on file    PHYSICAL EXAM  There were no vitals filed for this visit.  There is no height or weight on file to calculate BMI.  Generalized: Well developed, in no acute distress  Neurological examination  Mentation: Alert oriented to time, place, history taking. Follows all commands speech and language fluent Cranial nerve II-XII: Pupils were equal round reactive to light. Extraocular movements were full, visual field were full on confrontational test. Facial sensation and strength were normal.  Head turning and shoulder  shrug  were normal and symmetric. Motor: The motor testing reveals 5 over 5 strength of all 4 extremities. Good symmetric motor tone is noted throughout.  Sensory: Sensory testing is intact to soft touch on all 4 extremities. No evidence of extinction is noted.  Coordination: Cerebellar testing reveals good finger-nose-finger and heel-to-shin bilaterally.  Gait and station: Gait is normal.  Reflexes: Deep tendon reflexes are symmetric and normal bilaterally.   DIAGNOSTIC DATA (LABS, IMAGING, TESTING) - I reviewed Carol [name] records, labs, notes, testing and imaging myself where available.  Lab Results  Component Value Date   WBC 6.3 06/12/2023   HGB 13.8 06/12/2023   HCT 41.0 06/12/2023   MCV 77 (L) 06/12/2023   PLT 183 06/12/2023  Component Value Date/Time   NA 143 06/12/2023 0842   K 4.4 06/12/2023 0842   K 3.7 04/02/2017 1256   CL 104 06/12/2023 0842   CL 103 04/02/2017 1256   CO2 27 06/12/2023 0842   CO2 29 04/02/2017 1256   GLUCOSE 97 06/12/2023 0842   GLUCOSE 130 (H) 12/29/2021 0410   BUN 10 06/12/2023 0842   CREATININE 0.77 06/12/2023 0842   CREATININE 0.71 04/02/2017 1256   CALCIUM 9.4 06/12/2023 0842   CALCIUM 9.6 04/02/2017 1256   PROT 6.9 06/12/2023 0842   ALBUMIN 4.2 06/12/2023 0842   ALBUMIN 4.2 04/02/2017 1256   AST 35 06/12/2023 0842   AST 18 04/02/2017 1256   ALT 44 (H) 06/12/2023 0842   ALT 20 04/02/2017 1256   ALKPHOS 157 (H) 06/12/2023 0842   ALKPHOS 92 04/02/2017 1256   BILITOT 0.5 06/12/2023 0842   GFRNONAA >60 12/29/2021 0410   GFRAA 126 08/02/2020 1417   Lab Results  Component Value Date   CHOL 136 08/02/2020   HDL 47 08/02/2020   LDLCALC 77 08/02/2020   TRIG 58 08/02/2020   CHOLHDL 3.2 03/03/2017   Lab Results  Component Value Date   HGBA1C 5.2 03/03/2017   Lab Results  Component Value Date   VITAMINB12 749 04/02/2017   Lab Results  Component Value Date   TSH 1.160 08/02/2020    Margie Ege, AGNP-C, DNP 12/29/2023, 9:27  PM [facility] [address], [address] [place], [place] [address] [phone]

## 2023-12-30 ENCOUNTER — Encounter: Payer: Self-pay | Admitting: Neurology

## 2023-12-30 ENCOUNTER — Ambulatory Visit (INDEPENDENT_AMBULATORY_CARE_PROVIDER_SITE_OTHER): Payer: No Typology Code available for payment source | Admitting: Neurology

## 2023-12-30 VITALS — BP 128/81 | HR 65 | Ht 71.0 in | Wt 217.8 lb

## 2023-12-30 DIAGNOSIS — G35 Multiple sclerosis: Secondary | ICD-10-CM

## 2023-12-30 NOTE — Patient Instructions (Signed)
 Great to see you.  Will continue fingolimod.  Check routine labs today.  Plan for MRI of the brain and cervical spine May 2025.  Please reach out if you need anything.  Follow-up in 6 months.  Thanks!!

## 2023-12-31 ENCOUNTER — Encounter: Payer: Self-pay | Admitting: Neurology

## 2023-12-31 LAB — COMPREHENSIVE METABOLIC PANEL
ALT: 25 IU/L (ref 0–32)
AST: 19 IU/L (ref 0–40)
Albumin: 4.1 g/dL (ref 3.9–4.9)
Alkaline Phosphatase: 115 IU/L (ref 44–121)
BUN/Creatinine Ratio: 8 — ABNORMAL LOW (ref 9–23)
BUN: 6 mg/dL (ref 6–20)
Bilirubin Total: 0.6 mg/dL (ref 0.0–1.2)
CO2: 24 mmol/L (ref 20–29)
Calcium: 9.1 mg/dL (ref 8.7–10.2)
Chloride: 104 mmol/L (ref 96–106)
Creatinine, Ser: 0.76 mg/dL (ref 0.57–1.00)
Globulin, Total: 2.4 g/dL (ref 1.5–4.5)
Glucose: 80 mg/dL (ref 70–99)
Potassium: 3.8 mmol/L (ref 3.5–5.2)
Sodium: 142 mmol/L (ref 134–144)
Total Protein: 6.5 g/dL (ref 6.0–8.5)
eGFR: 103 mL/min/{1.73_m2} (ref >59)

## 2023-12-31 LAB — CBC WITH DIFFERENTIAL/PLATELET
Basophils Absolute: 0 10*3/uL (ref 0.0–0.2)
Basos: 1 %
EOS (ABSOLUTE): 0.2 10*3/uL (ref 0.0–0.4)
Eos: 3 %
Hematocrit: 36.8 % (ref 34.0–46.6)
Hemoglobin: 12.4 g/dL (ref 11.1–15.9)
Immature Grans (Abs): 0 10*3/uL (ref 0.0–0.1)
Immature Granulocytes: 0 %
Lymphocytes Absolute: 0.9 10*3/uL (ref 0.7–3.1)
Lymphs: 15 %
MCH: 26.2 pg — ABNORMAL LOW (ref 26.6–33.0)
MCHC: 33.7 g/dL (ref 31.5–35.7)
MCV: 78 fL — ABNORMAL LOW (ref 79–97)
Monocytes Absolute: 0.7 10*3/uL (ref 0.1–0.9)
Monocytes: 11 %
Neutrophils Absolute: 4.3 10*3/uL (ref 1.4–7.0)
Neutrophils: 70 %
Platelets: 162 10*3/uL (ref 150–450)
RBC: 4.73 x10E6/uL (ref 3.77–5.28)
RDW: 14.9 % (ref 11.7–15.4)
WBC: 6.2 10*3/uL (ref 3.4–10.8)

## 2024-02-24 ENCOUNTER — Telehealth: Payer: Self-pay | Admitting: Neurology

## 2024-02-24 DIAGNOSIS — G35 Multiple sclerosis: Secondary | ICD-10-CM

## 2024-02-24 NOTE — Telephone Encounter (Signed)
Orders Placed This Encounter  Procedures   MR BRAIN W WO CONTRAST   MR CERVICAL SPINE W WO CONTRAST

## 2024-02-25 ENCOUNTER — Telehealth: Payer: Self-pay | Admitting: Neurology

## 2024-02-25 NOTE — Telephone Encounter (Signed)
 sent to GI they obtain Carol [name] [phone]

## 2024-03-16 ENCOUNTER — Encounter: Payer: Self-pay | Admitting: Neurology

## 2024-03-17 ENCOUNTER — Telehealth: Payer: Self-pay

## 2024-03-17 ENCOUNTER — Other Ambulatory Visit (HOSPITAL_COMMUNITY): Payer: Self-pay

## 2024-03-17 NOTE — Telephone Encounter (Signed)
 Pharmacy Patient Advocate Encounter   Received notification from Patient Advice Request messages that prior authorization for Fingolimod  HCl 0.5MG  capsules is required/requested.   Insurance verification completed.   The patient is insured through [facility] .   Per test claim: PA required; PA submitted to above mentioned insurance via CoverMyMeds Key/confirmation #/EOC [id number] Status is pending

## 2024-03-17 NOTE — Telephone Encounter (Signed)
 Pharmacy Patient Advocate Encounter  Received notification from [facility] that Prior Authorization for Fingolimod  HCl 0.5MG  capsules has been APPROVED from 03/17/2024 to 03/17/2025   PA #/Case ID/Reference #: PA Case ID #: [id number]

## 2024-05-25 ENCOUNTER — Other Ambulatory Visit: Payer: Self-pay | Admitting: Neurology

## 2024-08-12 ENCOUNTER — Ambulatory Visit: Admitting: Neurology

## 2024-09-17 ENCOUNTER — Other Ambulatory Visit: Payer: Self-pay

## 2024-09-17 ENCOUNTER — Other Ambulatory Visit: Payer: Self-pay | Admitting: Neurology
